# Patient Record
Sex: Female | Born: 1940 | Race: White | Hispanic: No | Smoking: Former smoker
Health system: Southern US, Community
[De-identification: ages and names within clinical notes are randomized; demographics above are authoritative.]

## PROBLEM LIST (undated history)

## (undated) DIAGNOSIS — K52832 Lymphocytic colitis: Secondary | ICD-10-CM

## (undated) DIAGNOSIS — K219 Gastro-esophageal reflux disease without esophagitis: Secondary | ICD-10-CM

## (undated) DIAGNOSIS — E7800 Pure hypercholesterolemia, unspecified: Secondary | ICD-10-CM

## (undated) DIAGNOSIS — I10 Essential (primary) hypertension: Secondary | ICD-10-CM

## (undated) DIAGNOSIS — G43909 Migraine, unspecified, not intractable, without status migrainosus: Secondary | ICD-10-CM

## (undated) DIAGNOSIS — N390 Urinary tract infection, site not specified: Secondary | ICD-10-CM

## (undated) HISTORY — PX: TUBAL LIGATION: SHX77

---

## 2010-05-03 ENCOUNTER — Emergency Department (HOSPITAL_BASED_OUTPATIENT_CLINIC_OR_DEPARTMENT_OTHER)
Admission: EM | Admit: 2010-05-03 | Discharge: 2010-05-04 | Disposition: A | Discharge status: Home / Self Care | Payer: Medicare Other | Attending: Emergency Medicine | Admitting: Emergency Medicine

## 2010-05-03 DIAGNOSIS — R5381 Other malaise: Secondary | ICD-10-CM | POA: Insufficient documentation

## 2010-05-03 DIAGNOSIS — E785 Hyperlipidemia, unspecified: Secondary | ICD-10-CM | POA: Insufficient documentation

## 2010-05-03 DIAGNOSIS — E876 Hypokalemia: Secondary | ICD-10-CM | POA: Insufficient documentation

## 2010-05-03 DIAGNOSIS — M810 Age-related osteoporosis without current pathological fracture: Secondary | ICD-10-CM | POA: Insufficient documentation

## 2010-05-03 DIAGNOSIS — I10 Essential (primary) hypertension: Secondary | ICD-10-CM | POA: Insufficient documentation

## 2010-05-03 LAB — COMPREHENSIVE METABOLIC PANEL
Alkaline Phosphatase: 64 U/L (ref 39–117)
BUN: 21 mg/dL (ref 6–23)
Glucose, Bld: 146 mg/dL — ABNORMAL HIGH (ref 70–99)
Potassium: 3.4 mEq/L — ABNORMAL LOW (ref 3.5–5.1)
Total Bilirubin: 1 mg/dL (ref 0.3–1.2)
Total Protein: 7.7 g/dL (ref 6.0–8.3)

## 2010-05-03 LAB — CBC
HCT: 38.6 % (ref 36.0–46.0)
MCH: 29.1 pg (ref 26.0–34.0)
MCV: 87.7 fL (ref 78.0–100.0)
RDW: 13.6 % (ref 11.5–15.5)
WBC: 4.1 10*3/uL (ref 4.0–10.5)

## 2010-05-03 LAB — DIFFERENTIAL
Eosinophils Relative: 0 % (ref 0–5)
Lymphocytes Relative: 8 % — ABNORMAL LOW (ref 12–46)
Lymphs Abs: 0.3 10*3/uL — ABNORMAL LOW (ref 0.7–4.0)
Monocytes Absolute: 0.4 10*3/uL (ref 0.1–1.0)
Monocytes Relative: 9 % (ref 3–12)

## 2010-05-04 ENCOUNTER — Emergency Department (INDEPENDENT_AMBULATORY_CARE_PROVIDER_SITE_OTHER): Payer: Medicare Other

## 2010-05-04 ENCOUNTER — Emergency Department (HOSPITAL_BASED_OUTPATIENT_CLINIC_OR_DEPARTMENT_OTHER)
Admission: EM | Admit: 2010-05-04 | Discharge: 2010-05-04 | Disposition: A | Discharge status: Home / Self Care | Payer: Medicare Other | Attending: Emergency Medicine | Admitting: Emergency Medicine

## 2010-05-04 DIAGNOSIS — Z79899 Other long term (current) drug therapy: Secondary | ICD-10-CM | POA: Insufficient documentation

## 2010-05-04 DIAGNOSIS — R5383 Other fatigue: Secondary | ICD-10-CM | POA: Insufficient documentation

## 2010-05-04 DIAGNOSIS — E785 Hyperlipidemia, unspecified: Secondary | ICD-10-CM | POA: Insufficient documentation

## 2010-05-04 DIAGNOSIS — R197 Diarrhea, unspecified: Secondary | ICD-10-CM

## 2010-05-04 DIAGNOSIS — R112 Nausea with vomiting, unspecified: Secondary | ICD-10-CM

## 2010-05-04 DIAGNOSIS — R630 Anorexia: Secondary | ICD-10-CM | POA: Insufficient documentation

## 2010-05-04 DIAGNOSIS — I10 Essential (primary) hypertension: Secondary | ICD-10-CM | POA: Insufficient documentation

## 2010-05-04 DIAGNOSIS — E876 Hypokalemia: Secondary | ICD-10-CM | POA: Insufficient documentation

## 2010-05-04 DIAGNOSIS — M810 Age-related osteoporosis without current pathological fracture: Secondary | ICD-10-CM | POA: Insufficient documentation

## 2010-05-04 DIAGNOSIS — R5381 Other malaise: Secondary | ICD-10-CM

## 2010-05-04 LAB — BASIC METABOLIC PANEL
Calcium: 7.5 mg/dL — ABNORMAL LOW (ref 8.4–10.5)
Creatinine, Ser: 0.6 mg/dL (ref 0.4–1.2)
GFR calc Af Amer: >60 mL/min (ref >60)

## 2010-05-04 LAB — URINE MICROSCOPIC-ADD ON

## 2010-05-04 LAB — URINALYSIS, ROUTINE W REFLEX MICROSCOPIC
Specific Gravity, Urine: 1.02 (ref 1.005–1.030)
Urine Glucose, Fasting: NEGATIVE mg/dL
pH: 6 (ref 5.0–8.0)

## 2010-05-04 LAB — CBC
Hemoglobin: 11.4 g/dL — ABNORMAL LOW (ref 12.0–15.0)
RBC: 3.9 MIL/uL (ref 3.87–5.11)

## 2010-05-04 LAB — POTASSIUM: Potassium: 3.7 mEq/L (ref 3.5–5.1)

## 2010-05-04 LAB — DIFFERENTIAL
Basophils Absolute: 0 10*3/uL (ref 0.0–0.1)
Basophils Relative: 1 % (ref 0–1)
Monocytes Relative: 14 % — ABNORMAL HIGH (ref 3–12)
Neutro Abs: 2.7 10*3/uL (ref 1.7–7.7)
Neutrophils Relative %: 71 % (ref 43–77)

## 2012-03-21 ENCOUNTER — Emergency Department (HOSPITAL_BASED_OUTPATIENT_CLINIC_OR_DEPARTMENT_OTHER): Payer: Medicare Other

## 2012-03-21 ENCOUNTER — Encounter (HOSPITAL_BASED_OUTPATIENT_CLINIC_OR_DEPARTMENT_OTHER): Payer: Self-pay | Admitting: *Deleted

## 2012-03-21 ENCOUNTER — Emergency Department (HOSPITAL_BASED_OUTPATIENT_CLINIC_OR_DEPARTMENT_OTHER)
Admission: EM | Admit: 2012-03-21 | Discharge: 2012-03-21 | Disposition: A | Discharge status: Home / Self Care | Payer: Medicare Other | Attending: Emergency Medicine | Admitting: Emergency Medicine

## 2012-03-21 DIAGNOSIS — E7800 Pure hypercholesterolemia, unspecified: Secondary | ICD-10-CM | POA: Insufficient documentation

## 2012-03-21 DIAGNOSIS — Z87440 Personal history of urinary (tract) infections: Secondary | ICD-10-CM | POA: Insufficient documentation

## 2012-03-21 DIAGNOSIS — Z87891 Personal history of nicotine dependence: Secondary | ICD-10-CM | POA: Insufficient documentation

## 2012-03-21 DIAGNOSIS — I10 Essential (primary) hypertension: Secondary | ICD-10-CM | POA: Insufficient documentation

## 2012-03-21 DIAGNOSIS — R110 Nausea: Secondary | ICD-10-CM | POA: Insufficient documentation

## 2012-03-21 DIAGNOSIS — Z79899 Other long term (current) drug therapy: Secondary | ICD-10-CM | POA: Insufficient documentation

## 2012-03-21 DIAGNOSIS — R05 Cough: Secondary | ICD-10-CM | POA: Insufficient documentation

## 2012-03-21 DIAGNOSIS — R059 Cough, unspecified: Secondary | ICD-10-CM | POA: Insufficient documentation

## 2012-03-21 DIAGNOSIS — R197 Diarrhea, unspecified: Secondary | ICD-10-CM | POA: Insufficient documentation

## 2012-03-21 DIAGNOSIS — Z8719 Personal history of other diseases of the digestive system: Secondary | ICD-10-CM | POA: Insufficient documentation

## 2012-03-21 HISTORY — DX: Lymphocytic colitis: K52.832

## 2012-03-21 HISTORY — DX: Urinary tract infection, site not specified: N39.0

## 2012-03-21 LAB — URINALYSIS, ROUTINE W REFLEX MICROSCOPIC
Glucose, UA: NEGATIVE mg/dL
Ketones, ur: 40 mg/dL — AB
Leukocytes, UA: NEGATIVE
Protein, ur: NEGATIVE mg/dL

## 2012-03-21 LAB — CBC WITH DIFFERENTIAL/PLATELET
Basophils Absolute: 0 10*3/uL (ref 0.0–0.1)
Basophils Relative: 1 % (ref 0–1)
Eosinophils Absolute: 0.1 10*3/uL (ref 0.0–0.7)
Eosinophils Relative: 1 % (ref 0–5)
HCT: 30 % — ABNORMAL LOW (ref 36.0–46.0)
MCH: 28.7 pg (ref 26.0–34.0)
MCHC: 31.3 g/dL (ref 30.0–36.0)
Monocytes Absolute: 1.1 10*3/uL — ABNORMAL HIGH (ref 0.1–1.0)
Neutro Abs: 5.1 10*3/uL (ref 1.7–7.7)
RDW: 16.9 % — ABNORMAL HIGH (ref 11.5–15.5)

## 2012-03-21 LAB — COMPREHENSIVE METABOLIC PANEL
AST: 24 U/L (ref 0–37)
Albumin: 3.2 g/dL — ABNORMAL LOW (ref 3.5–5.2)
Calcium: 9.8 mg/dL (ref 8.4–10.5)
Chloride: 94 mEq/L — ABNORMAL LOW (ref 96–112)
Creatinine, Ser: 0.7 mg/dL (ref 0.50–1.10)
Total Protein: 6.8 g/dL (ref 6.0–8.3)

## 2012-03-21 MED ORDER — SODIUM CHLORIDE 0.9 % IV BOLUS (SEPSIS)
500.0000 mL | Freq: Once | INTRAVENOUS | Status: AC
  Administered 2012-03-21: 500 mL via INTRAVENOUS

## 2012-03-21 MED ORDER — ONDANSETRON HCL 4 MG PO TABS: 4.0000 mg | ORAL_TABLET | Freq: Four times a day (QID) | ORAL | Status: DC

## 2012-03-21 MED ORDER — DIPHENOXYLATE-ATROPINE 2.5-0.025 MG PO TABS: 1.0000 | ORAL_TABLET | Freq: Four times a day (QID) | ORAL | Status: DC | PRN

## 2012-03-21 MED ORDER — ONDANSETRON HCL 4 MG/2ML IJ SOLN
4.0000 mg | Freq: Once | INTRAMUSCULAR | Status: AC
  Administered 2012-03-21: 4 mg via INTRAVENOUS
  Filled 2012-03-21: qty 2

## 2012-03-21 NOTE — ED Notes (Signed)
Family at bedside, reports she's not acting per norm.  Family states they went shopping yesterday and she came home and just went to bed.  Family states the last time she seemed "out of it", she had a UTI.  Pt. Denies any pain.  Pt. Reports she has had diarrhea over the last couple of weeks and just feels tired.

## 2012-03-21 NOTE — ED Notes (Signed)
Pt's daughter states pt has a 3 days history of not feeling good and extreme tiredness, decreased po and food intake and excessive sleepiness.  States she has had similar episode of same and had a UTI.

## 2012-03-21 NOTE — ED Provider Notes (Signed)
History     CSN: [id number]  Arrival date & time 03/21/12  1236   First MD Initiated Contact with Patient 03/21/12 1257      Chief Complaint  Patient presents with  . Altered Mental Status    (Consider location/radiation/quality/duration/timing/severity/associated sxs/prior treatment) Patient is a 71 y.o. female presenting with altered mental status. The history is provided by the patient, a relative and a friend. No language interpreter was used.  Altered Mental Status This is a new problem. The current episode started in the past 7 days. The problem occurs daily. The problem has been gradually worsening. Associated symptoms include a change in bowel habit, coughing and nausea. Pertinent negatives include no chest pain, congestion, diaphoresis, fever, rash, sore throat or vomiting. She has tried nothing for the symptoms.   71 year old female coming in today with complaint of decreased level of consciousness. Patient is here with HER-2 sisters that say she has been very sleepy since Christmas. States that they were shopping until  4 pm esterday and she was so sleepy she could not drive home. The patient is slow to answer during the assessment. States that she has had increased episodes of diarrhea in the past 3 days. States she is out of her Lomotil at home. She has a past medical history of hypertension, cardiac cholesterol, colitis. Denies any abdominal pain or blood in her stool. States she did have a cough when she woke up this morning. Denies any fever, vomiting, dysuria or frequency.     Past Medical History  Diagnosis Date  . Lymphocytic colitis   . UTI (lower urinary tract infection)     History reviewed. No pertinent past surgical history.  No family history on file.  History  Substance Use Topics  . Smoking status: Former Games developer  . Smokeless tobacco: Not on file  . Alcohol Use: No    OB History    Grav Para Term Preterm Abortions TAB SAB Ect Mult Living        Review of Systems  Constitutional: Negative.  Negative for fever and diaphoresis.  HENT: Negative.  Negative for congestion, sore throat and rhinorrhea.   Eyes: Negative.   Respiratory: Positive for cough. Negative for shortness of breath and wheezing.   Cardiovascular: Negative.  Negative for chest pain and leg swelling.  Gastrointestinal: Positive for nausea and change in bowel habit. Negative for vomiting.  Genitourinary: Negative for dysuria, vaginal bleeding, vaginal discharge and difficulty urinating.  Musculoskeletal: Negative for back pain and gait problem.  Skin: Negative for rash.  Neurological: Negative.   Psychiatric/Behavioral: Positive for altered mental status.  All other systems reviewed and are negative.    Allergies  Review of patient's allergies indicates no known allergies.  Home Medications   Current Outpatient Rx  Name  Route  Sig  Dispense  Refill  . ATENOLOL 25 MG PO TABS   Oral   Take 25 mg by mouth daily.         Marland Kitchen DIPHENOXYLATE-ATROPINE 2.5-0.025 MG PO TABS   Oral   Take 1 tablet by mouth 4 (four) times daily as needed.         Marland Kitchen HYDROCHLOROTHIAZIDE 25 MG PO TABS   Oral   Take 25 mg by mouth daily.         Marland Kitchen OMEPRAZOLE 10 MG PO CPDR   Oral   Take 10 mg by mouth as needed.           BP 106/52  Pulse 75  Temp 98.6 F (37 C) (Oral)  Resp 16  SpO2 97%  Physical Exam  Nursing note and vitals reviewed. Constitutional: She is oriented to person, place, and time. She appears well-developed and well-nourished.  HENT:  Head: Normocephalic and atraumatic.  Eyes: Conjunctivae normal and EOM are normal. Pupils are equal, round, and reactive to light.  Neck: Normal range of motion. Neck supple.  Cardiovascular: Normal rate and regular rhythm.   Pulmonary/Chest: Effort normal and breath sounds normal. No respiratory distress. She has no wheezes.  Abdominal: Soft. Bowel sounds are normal. She exhibits no distension. There is no  tenderness.  Musculoskeletal: Normal range of motion. She exhibits no edema and no tenderness.  Neurological: She is alert and oriented to person, place, and time. She has normal reflexes.  Skin: Skin is warm and dry.  Psychiatric: She has a normal mood and affect.    ED Course  Procedures (including critical care time)  Labs Reviewed  URINALYSIS, ROUTINE W REFLEX MICROSCOPIC - Abnormal; Notable for the following:    APPearance CLOUDY (*)     Bilirubin Urine MODERATE (*)     Ketones, ur 40 (*)     All other components within normal limits  CBC WITH DIFFERENTIAL - Abnormal; Notable for the following:    RBC 3.27 (*)     Hemoglobin 9.4 (*)     HCT 30.0 (*)     RDW 16.9 (*)     Monocytes Relative 15 (*)     Monocytes Absolute 1.1 (*)     All other components within normal limits  COMPREHENSIVE METABOLIC PANEL - Abnormal; Notable for the following:    Sodium 134 (*)     Chloride 94 (*)     Glucose, Bld 129 (*)     Albumin 3.2 (*)     GFR calc non Af Amer 86 (*)     All other components within normal limits   Dg Chest 2 View  03/21/2012  *RADIOLOGY REPORT*  Clinical Data: Altered level of consciousness.  CHEST - 2 VIEW  Comparison: 05/04/2010  Findings: Minimal right base density, likely atelectasis.  Heart is borderline in size.  No effusions.  Left lung is clear.   Moderate compression fracture in the mid thoracic spine, new since prior study.  Degenerative changes in the thoracolumbar spine.  IMPRESSION: Minimal right base atelectasis.   Original Report Authenticated By: Charlett Nose, M.D.    Ct Head Wo Contrast  03/21/2012  *RADIOLOGY REPORT*  Clinical Data: Decreased level of consciousness.  CT HEAD WITHOUT CONTRAST  Technique:  Contiguous axial images were obtained from the base of the skull through the vertex without contrast.  Comparison: None.  Findings: Extensive low density throughout the periventricular and deep white matter, likely chronic ischemic changes.  No  hydrocephalus.  No acute infarction or hemorrhage.  No mass lesion. No calvarial abnormality. Visualized paranasal sinuses and mastoids clear.  Orbital soft tissues unremarkable.  IMPRESSION: Extensive chronic ischemic changes throughout the deep white matter.  No acute findings.   Original Report Authenticated By: Charlett Nose, M.D.      No diagnosis found.    MDM  Decreased LOC and sleepiness x3 days. Chest x-ray reviewed by myself shows no pneumonia. Urinalysis shows no infection. White blood cell count was normal today. CT of her head reviewed by myself is unremarkable. Patient has had some increased diarrhea in the past 3 days. Patient seemed better after 500 cc of normal saline. She is out of her  Lomotil for your prescription for Lomotil and Zofran. She will followup with her primary care provider on Monday.  Labs Reviewed  URINALYSIS, ROUTINE W REFLEX MICROSCOPIC - Abnormal; Notable for the following:    APPearance CLOUDY (*)     Bilirubin Urine MODERATE (*)     Ketones, ur 40 (*)     All other components within normal limits  CBC WITH DIFFERENTIAL - Abnormal; Notable for the following:    RBC 3.27 (*)     Hemoglobin 9.4 (*)     HCT 30.0 (*)     RDW 16.9 (*)     Monocytes Relative 15 (*)     Monocytes Absolute 1.1 (*)     All other components within normal limits  COMPREHENSIVE METABOLIC PANEL - Abnormal; Notable for the following:    Sodium 134 (*)     Chloride 94 (*)     Glucose, Bld 129 (*)     Albumin 3.2 (*)     GFR calc non Af Amer 86 (*)     All other components within normal limits          Remi Haggard, NP 03/22/12 0945

## 2012-03-21 NOTE — ED Notes (Signed)
Pt. Vomiting, NP made aware.  Orders received.

## 2012-03-22 ENCOUNTER — Emergency Department (HOSPITAL_BASED_OUTPATIENT_CLINIC_OR_DEPARTMENT_OTHER)
Admission: EM | Admit: 2012-03-22 | Discharge: 2012-03-22 | Disposition: A | Discharge status: Home / Self Care | Payer: Medicare Other | Attending: Emergency Medicine | Admitting: Emergency Medicine

## 2012-03-22 ENCOUNTER — Encounter (HOSPITAL_BASED_OUTPATIENT_CLINIC_OR_DEPARTMENT_OTHER): Payer: Self-pay | Admitting: *Deleted

## 2012-03-22 DIAGNOSIS — Z79899 Other long term (current) drug therapy: Secondary | ICD-10-CM | POA: Insufficient documentation

## 2012-03-22 DIAGNOSIS — Z87891 Personal history of nicotine dependence: Secondary | ICD-10-CM | POA: Insufficient documentation

## 2012-03-22 DIAGNOSIS — R112 Nausea with vomiting, unspecified: Secondary | ICD-10-CM | POA: Insufficient documentation

## 2012-03-22 DIAGNOSIS — Z8719 Personal history of other diseases of the digestive system: Secondary | ICD-10-CM | POA: Insufficient documentation

## 2012-03-22 DIAGNOSIS — Z87440 Personal history of urinary (tract) infections: Secondary | ICD-10-CM | POA: Insufficient documentation

## 2012-03-22 LAB — CBC
HCT: 28.4 % — ABNORMAL LOW (ref 36.0–46.0)
Hemoglobin: 9 g/dL — ABNORMAL LOW (ref 12.0–15.0)
MCH: 28.8 pg (ref 26.0–34.0)
MCV: 91 fL (ref 78.0–100.0)
RBC: 3.12 MIL/uL — ABNORMAL LOW (ref 3.87–5.11)
WBC: 5.9 10*3/uL (ref 4.0–10.5)

## 2012-03-22 LAB — BASIC METABOLIC PANEL
CO2: 28 mEq/L (ref 19–32)
Calcium: 8.5 mg/dL (ref 8.4–10.5)
Chloride: 95 mEq/L — ABNORMAL LOW (ref 96–112)
Creatinine, Ser: 0.5 mg/dL (ref 0.50–1.10)
Glucose, Bld: 153 mg/dL — ABNORMAL HIGH (ref 70–99)

## 2012-03-22 MED ORDER — SODIUM CHLORIDE 0.9 % IV BOLUS (SEPSIS)
500.0000 mL | Freq: Once | INTRAVENOUS | Status: AC
  Administered 2012-03-22: 500 mL via INTRAVENOUS

## 2012-03-22 MED ORDER — METOCLOPRAMIDE HCL 5 MG/ML IJ SOLN
10.0000 mg | Freq: Once | INTRAMUSCULAR | Status: AC
  Administered 2012-03-22: 10 mg via INTRAVENOUS
  Filled 2012-03-22: qty 2

## 2012-03-22 MED ORDER — ONDANSETRON HCL 4 MG/2ML IJ SOLN
4.0000 mg | Freq: Once | INTRAMUSCULAR | Status: AC
  Administered 2012-03-22: 4 mg via INTRAVENOUS
  Filled 2012-03-22: qty 2

## 2012-03-22 MED ORDER — PROMETHAZINE HCL 25 MG RE SUPP: 12.5000 mg | Freq: Four times a day (QID) | RECTAL | Status: DC | PRN

## 2012-03-22 MED ORDER — FAMOTIDINE IN NACL 20-0.9 MG/50ML-% IV SOLN
20.0000 mg | Freq: Once | INTRAVENOUS | Status: AC
  Administered 2012-03-22: 20 mg via INTRAVENOUS
  Filled 2012-03-22: qty 50

## 2012-03-22 MED ORDER — METOCLOPRAMIDE HCL 10 MG PO TABS: 10.0000 mg | ORAL_TABLET | Freq: Four times a day (QID) | ORAL | Status: DC

## 2012-03-22 NOTE — ED Notes (Addendum)
Diarrhea since Christmas Eve, vomiting since yesterday. Hx colitis. "Want to sleep all the time" Here yesterday.

## 2012-03-23 NOTE — ED Provider Notes (Signed)
History     CSN: [id number]  Arrival date & time 03/22/12  1536   First MD Initiated Contact with Patient 03/22/12 1551      Chief Complaint  Patient presents with  . Emesis    (Consider location/radiation/quality/duration/timing/severity/associated sxs/prior treatment) Patient is a 71 y.o. female presenting with vomiting. The history is provided by the patient. No language interpreter was used.  Emesis  This is a recurrent problem. The current episode started 6 to 12 hours ago. The problem occurs 2 to 4 times per day. The problem has not changed since onset.The emesis has an appearance of stomach contents. Associated symptoms include a fever. Pertinent negatives include no abdominal pain and no diarrhea.   71 year old female coming in with recurrent nausea vomiting. Patient was here in the ER yesterday for nausea vomiting diarrhea and decreased LOC. States he went she went home she was able to eat something but later in the night she started vomiting again. She has had no further diarrhea. Patient is nauseated presently. She is here with her family. Non toxic appearance.   Past Medical History  Diagnosis Date  . Lymphocytic colitis   . UTI (lower urinary tract infection)     History reviewed. No pertinent past surgical history.  History reviewed. No pertinent family history.  History  Substance Use Topics  . Smoking status: Former Games developer  . Smokeless tobacco: Not on file  . Alcohol Use: No    OB History    Grav Para Term Preterm Abortions TAB SAB Ect Mult Living                  Review of Systems  Constitutional: Positive for fever.  HENT: Negative.  Negative for congestion, rhinorrhea and postnasal drip.   Eyes: Negative.   Respiratory: Negative.  Negative for shortness of breath.   Cardiovascular: Negative.  Negative for chest pain.  Gastrointestinal: Positive for nausea and vomiting. Negative for abdominal pain and diarrhea.  Neurological: Negative.     Psychiatric/Behavioral: Negative.   All other systems reviewed and are negative.    Allergies  Tetanus toxoids  Home Medications   Current Outpatient Rx  Name  Route  Sig  Dispense  Refill  . ATENOLOL 25 MG PO TABS   Oral   Take 25 mg by mouth daily.         Marland Kitchen DIPHENOXYLATE-ATROPINE 2.5-0.025 MG PO TABS   Oral   Take 1 tablet by mouth 4 (four) times daily as needed.         Marland Kitchen DIPHENOXYLATE-ATROPINE 2.5-0.025 MG PO TABS   Oral   Take 1 tablet by mouth 4 (four) times daily as needed for diarrhea or loose stools.   30 tablet   0   . HYDROCHLOROTHIAZIDE 25 MG PO TABS   Oral   Take 25 mg by mouth daily.         Marland Kitchen METOCLOPRAMIDE HCL 10 MG PO TABS   Oral   Take 1 tablet (10 mg total) by mouth every 6 (six) hours.   30 tablet   0   . OMEPRAZOLE 10 MG PO CPDR   Oral   Take 10 mg by mouth as needed.         Marland Kitchen ONDANSETRON HCL 4 MG PO TABS   Oral   Take 1 tablet (4 mg total) by mouth every 6 (six) hours.   12 tablet   0   . PROMETHAZINE HCL 25 MG RE SUPP   Rectal  Place 0.5 suppositories (12.5 mg total) rectally every 6 (six) hours as needed for nausea.   12 each   0     BP 128/65  Pulse 74  Temp 98 F (36.7 C) (Oral)  Resp 18  SpO2 95%  Physical Exam  Nursing note and vitals reviewed. Constitutional: She is oriented to person, place, and time. She appears well-developed and well-nourished.  HENT:  Head: Normocephalic and atraumatic.  Eyes: Conjunctivae normal and EOM are normal. Pupils are equal, round, and reactive to light.  Neck: Normal range of motion. Neck supple.  Cardiovascular: Normal rate.   Pulmonary/Chest: Effort normal and breath sounds normal.  Abdominal: Soft. Bowel sounds are normal. She exhibits no distension. There is no tenderness. There is no rebound and no guarding.  Musculoskeletal: Normal range of motion. She exhibits no edema and no tenderness.  Neurological: She is alert and oriented to person, place, and time. She has  normal reflexes.  Skin: Skin is warm and dry.  Psychiatric: She has a normal mood and affect.    ED Course  Procedures (including critical care time)  Labs Reviewed  CBC - Abnormal; Notable for the following:    RBC 3.12 (*)     Hemoglobin 9.0 (*)     HCT 28.4 (*)     RDW 16.2 (*)     All other components within normal limits  BASIC METABOLIC PANEL - Abnormal; Notable for the following:    Sodium 132 (*)     Chloride 95 (*)     Glucose, Bld 153 (*)     All other components within normal limits   No results found.   1. Nausea & vomiting       MDM  71 yo female with viral illness and recurring nausea and vomiting.  Diarrhea has resolved.  CBC and Bmet with na 132 and hgb 9.0.  Ns Bolus, IV Zofran, reglan and pepcid in the ER with resolution of vomiting.  rx for reglan and phenergan supp.  She will follow up with her pcp on Monday.  Tolerating po's in the ER.  She wants  To go home and feels better.  She understands to return for worsening symptoms. Ready for discharge.    Labs Reviewed  CBC - Abnormal; Notable for the following:    RBC 3.12 (*)     Hemoglobin 9.0 (*)     HCT 28.4 (*)     RDW 16.2 (*)     All other components within normal limits  BASIC METABOLIC PANEL - Abnormal; Notable for the following:    Sodium 132 (*)     Chloride 95 (*)     Glucose, Bld 153 (*)     All other components within normal limits         Remi Haggard, NP 03/24/12 1125

## 2012-03-25 NOTE — ED Provider Notes (Signed)
Medical screening examination/treatment/procedure(s) were performed by non-physician practitioner and as supervising physician I was immediately available for consultation/collaboration.  Raeford Razor, MD 03/25/12 1336

## 2012-03-27 NOTE — ED Provider Notes (Signed)
Medical screening examination/treatment/procedure(s) were conducted as a shared visit with non-physician practitioner(s) and myself.  I personally evaluated the patient during the encounter  Ethelda Chick, MD 03/27/12 [phone]

## 2015-10-30 ENCOUNTER — Emergency Department (HOSPITAL_BASED_OUTPATIENT_CLINIC_OR_DEPARTMENT_OTHER): Payer: Medicare Other

## 2015-10-30 ENCOUNTER — Encounter (HOSPITAL_BASED_OUTPATIENT_CLINIC_OR_DEPARTMENT_OTHER): Payer: Self-pay | Admitting: *Deleted

## 2015-10-30 ENCOUNTER — Inpatient Hospital Stay (HOSPITAL_BASED_OUTPATIENT_CLINIC_OR_DEPARTMENT_OTHER)
Admission: EM | Admit: 2015-10-30 | Discharge: 2015-11-10 | DRG: 445 | Disposition: A | Discharge status: Home Health | Payer: Medicare Other | Attending: Internal Medicine | Admitting: Internal Medicine

## 2015-10-30 DIAGNOSIS — I10 Essential (primary) hypertension: Secondary | ICD-10-CM

## 2015-10-30 DIAGNOSIS — K8050 Calculus of bile duct without cholangitis or cholecystitis without obstruction: Secondary | ICD-10-CM | POA: Diagnosis not present

## 2015-10-30 DIAGNOSIS — K828 Other specified diseases of gallbladder: Secondary | ICD-10-CM | POA: Diagnosis present

## 2015-10-30 DIAGNOSIS — K8051 Calculus of bile duct without cholangitis or cholecystitis with obstruction: Secondary | ICD-10-CM | POA: Diagnosis present

## 2015-10-30 DIAGNOSIS — K2940 Chronic atrophic gastritis without bleeding: Secondary | ICD-10-CM | POA: Diagnosis present

## 2015-10-30 DIAGNOSIS — E860 Dehydration: Secondary | ICD-10-CM

## 2015-10-30 DIAGNOSIS — R740 Nonspecific elevation of levels of transaminase and lactic acid dehydrogenase [LDH]: Secondary | ICD-10-CM

## 2015-10-30 DIAGNOSIS — K8031 Calculus of bile duct with cholangitis, unspecified, with obstruction: Secondary | ICD-10-CM | POA: Diagnosis not present

## 2015-10-30 DIAGNOSIS — R062 Wheezing: Secondary | ICD-10-CM | POA: Clinically undetermined

## 2015-10-30 DIAGNOSIS — E876 Hypokalemia: Secondary | ICD-10-CM | POA: Clinically undetermined

## 2015-10-30 DIAGNOSIS — K838 Other specified diseases of biliary tract: Secondary | ICD-10-CM | POA: Diagnosis present

## 2015-10-30 DIAGNOSIS — Z887 Allergy status to serum and vaccine status: Secondary | ICD-10-CM

## 2015-10-30 DIAGNOSIS — K449 Diaphragmatic hernia without obstruction or gangrene: Secondary | ICD-10-CM | POA: Diagnosis present

## 2015-10-30 DIAGNOSIS — R0602 Shortness of breath: Secondary | ICD-10-CM

## 2015-10-30 DIAGNOSIS — Z87891 Personal history of nicotine dependence: Secondary | ICD-10-CM

## 2015-10-30 DIAGNOSIS — K5710 Diverticulosis of small intestine without perforation or abscess without bleeding: Secondary | ICD-10-CM | POA: Diagnosis present

## 2015-10-30 DIAGNOSIS — Z79899 Other long term (current) drug therapy: Secondary | ICD-10-CM

## 2015-10-30 DIAGNOSIS — R7401 Elevation of levels of liver transaminase levels: Secondary | ICD-10-CM | POA: Diagnosis present

## 2015-10-30 DIAGNOSIS — Z1611 Resistance to penicillins: Secondary | ICD-10-CM | POA: Diagnosis present

## 2015-10-30 DIAGNOSIS — K831 Obstruction of bile duct: Secondary | ICD-10-CM | POA: Diagnosis present

## 2015-10-30 DIAGNOSIS — B961 Klebsiella pneumoniae [K. pneumoniae] as the cause of diseases classified elsewhere: Secondary | ICD-10-CM | POA: Diagnosis present

## 2015-10-30 DIAGNOSIS — K219 Gastro-esophageal reflux disease without esophagitis: Secondary | ICD-10-CM | POA: Diagnosis present

## 2015-10-30 HISTORY — DX: Migraine, unspecified, not intractable, without status migrainosus: G43.909

## 2015-10-30 HISTORY — DX: Essential (primary) hypertension: I10

## 2015-10-30 HISTORY — DX: Gastro-esophageal reflux disease without esophagitis: K21.9

## 2015-10-30 HISTORY — DX: Pure hypercholesterolemia, unspecified: E78.00

## 2015-10-30 LAB — CBC
HEMATOCRIT: 35.3 % — AB (ref 36.0–46.0)
Hemoglobin: 11.7 g/dL — ABNORMAL LOW (ref 12.0–15.0)
MCH: 29.5 pg (ref 26.0–34.0)
MCHC: 33.1 g/dL (ref 30.0–36.0)
MCV: 89.1 fL (ref 78.0–100.0)
PLATELETS: 211 10*3/uL (ref 150–400)
RBC: 3.96 MIL/uL (ref 3.87–5.11)
RDW: 13.6 % (ref 11.5–15.5)
WBC: 8.8 10*3/uL (ref 4.0–10.5)

## 2015-10-30 LAB — CBC WITH DIFFERENTIAL/PLATELET
BASOS ABS: 0 10*3/uL (ref 0.0–0.1)
BASOS PCT: 0 %
Eosinophils Absolute: 0 10*3/uL (ref 0.0–0.7)
Eosinophils Relative: 0 %
HEMATOCRIT: 37.3 % (ref 36.0–46.0)
HEMOGLOBIN: 12.7 g/dL (ref 12.0–15.0)
LYMPHS PCT: 6 %
Lymphs Abs: 0.5 10*3/uL — ABNORMAL LOW (ref 0.7–4.0)
MCH: 29.4 pg (ref 26.0–34.0)
MCHC: 34 g/dL (ref 30.0–36.0)
MCV: 86.3 fL (ref 78.0–100.0)
Monocytes Absolute: 0.9 10*3/uL (ref 0.1–1.0)
Monocytes Relative: 10 %
NEUTROS ABS: 8 10*3/uL — AB (ref 1.7–7.7)
NEUTROS PCT: 84 %
Platelets: 230 10*3/uL (ref 150–400)
RBC: 4.32 MIL/uL (ref 3.87–5.11)
RDW: 13.1 % (ref 11.5–15.5)
WBC: 9.5 10*3/uL (ref 4.0–10.5)

## 2015-10-30 LAB — CREATININE, SERUM
Creatinine, Ser: 0.67 mg/dL (ref 0.44–1.00)
GFR calc non Af Amer: >60 mL/min (ref >60)

## 2015-10-30 LAB — MAGNESIUM: Magnesium: 2.2 mg/dL (ref 1.7–2.4)

## 2015-10-30 LAB — URINE MICROSCOPIC-ADD ON

## 2015-10-30 LAB — COMPREHENSIVE METABOLIC PANEL
ALK PHOS: 274 U/L — AB (ref 38–126)
ALT: 231 U/L — AB (ref 14–54)
AST: 185 U/L — ABNORMAL HIGH (ref 15–41)
Albumin: 3.4 g/dL — ABNORMAL LOW (ref 3.5–5.0)
Anion gap: 10 (ref 5–15)
BUN: 13 mg/dL (ref 6–20)
CALCIUM: 8.8 mg/dL — AB (ref 8.9–10.3)
CO2: 30 mmol/L (ref 22–32)
Chloride: 92 mmol/L — ABNORMAL LOW (ref 101–111)
Creatinine, Ser: 0.61 mg/dL (ref 0.44–1.00)
Glucose, Bld: 137 mg/dL — ABNORMAL HIGH (ref 65–99)
Potassium: 3.4 mmol/L — ABNORMAL LOW (ref 3.5–5.1)
Sodium: 132 mmol/L — ABNORMAL LOW (ref 135–145)
TOTAL PROTEIN: 7.8 g/dL (ref 6.5–8.1)
Total Bilirubin: 6.3 mg/dL — ABNORMAL HIGH (ref 0.3–1.2)

## 2015-10-30 LAB — PHOSPHORUS: Phosphorus: 2 mg/dL — ABNORMAL LOW (ref 2.5–4.6)

## 2015-10-30 LAB — URINALYSIS, ROUTINE W REFLEX MICROSCOPIC
Glucose, UA: NEGATIVE mg/dL
KETONES UR: NEGATIVE mg/dL
NITRITE: NEGATIVE
PH: 7 (ref 5.0–8.0)
Protein, ur: 30 mg/dL — AB

## 2015-10-30 LAB — LIPASE, BLOOD: Lipase: 23 U/L (ref 11–51)

## 2015-10-30 MED ORDER — METOCLOPRAMIDE HCL 10 MG PO TABS
10.0000 mg | ORAL_TABLET | Freq: Four times a day (QID) | ORAL | Status: DC
  Administered 2015-10-30 – 2015-11-05 (×19): 10 mg via ORAL
  Filled 2015-10-30 (×20): qty 1

## 2015-10-30 MED ORDER — HYDROCODONE-ACETAMINOPHEN 5-325 MG PO TABS
1.0000 | ORAL_TABLET | ORAL | Status: DC | PRN
  Administered 2015-10-30 – 2015-11-09 (×2): 1 via ORAL
  Filled 2015-10-30 (×3): qty 1

## 2015-10-30 MED ORDER — ONDANSETRON HCL 4 MG PO TABS: 4.0000 mg | ORAL_TABLET | Freq: Four times a day (QID) | ORAL | Status: DC | PRN

## 2015-10-30 MED ORDER — SODIUM CHLORIDE 0.9 % IV BOLUS (SEPSIS)
500.0000 mL | Freq: Once | INTRAVENOUS | Status: AC
  Administered 2015-10-30: 500 mL via INTRAVENOUS

## 2015-10-30 MED ORDER — ONDANSETRON HCL 4 MG/2ML IJ SOLN: 4.0000 mg | Freq: Four times a day (QID) | INTRAMUSCULAR | Status: DC | PRN

## 2015-10-30 MED ORDER — IOPAMIDOL (ISOVUE-300) INJECTION 61%
100.0000 mL | Freq: Once | INTRAVENOUS | Status: AC | PRN
  Administered 2015-10-30: 100 mL via INTRAVENOUS

## 2015-10-30 MED ORDER — HEPARIN SODIUM (PORCINE) 5000 UNIT/ML IJ SOLN
5000.0000 [IU] | Freq: Three times a day (TID) | INTRAMUSCULAR | Status: DC
  Administered 2015-10-30 – 2015-11-01 (×7): 5000 [IU] via SUBCUTANEOUS
  Filled 2015-10-30 (×7): qty 1

## 2015-10-30 MED ORDER — KCL IN DEXTROSE-NACL 20-5-0.9 MEQ/L-%-% IV SOLN
INTRAVENOUS | Status: DC
  Administered 2015-10-30 – 2015-11-05 (×9): via INTRAVENOUS
  Filled 2015-10-30 (×11): qty 1000

## 2015-10-30 NOTE — Progress Notes (Signed)
[facility] for further evaluation of dilated common bile duct with possible mass versus stone obstructing. Dr. Bosie ClosSchooler from [facility] consulted, patient will be coming to medical bed

## 2015-10-30 NOTE — ED Notes (Signed)
Pt back from CT

## 2015-10-30 NOTE — ED Notes (Signed)
Notified [facility]([place] for GI Consult  Notified [facility]([place] for hospitalist

## 2015-10-30 NOTE — ED Provider Notes (Addendum)
[facility] Provider Note   CSN: [id number] Arrival date & time: 10/30/15  4098  First Provider Contact:  First MD Initiated Contact with Patient 10/30/15 0932        History   Chief Complaint Chief Complaint  Patient presents with  . Weakness    HPI Amy Sheppard is a 75 y.o. female.  Patient presents with weakness. She has a history of hypertension and lymphocytic colitis. She states that 4 days ago, she had an episode of vomiting. She vomited through the night. She feels like she just ate too much and that led to the vomiting. She hasn't had any further vomiting since that time. No abdominal pain. No diarrhea. No known fevers. No urinary symptoms. No chest pain or shortness of breath. No dizziness or lightheadedness. No cough or chest congestion. She states she's just been feeling weak all over.      Past Medical History:  Diagnosis Date  . Hypertension   . Lymphocytic colitis   . UTI (lower urinary tract infection)     There are no active problems to display for this patient.   Past Surgical History:  Procedure Laterality Date  . TUBAL LIGATION      OB History    No data available       Home Medications    Prior to Admission medications   Medication Sig Start Date End Date Taking? Authorizing Provider  atenolol (TENORMIN) 25 MG tablet Take 25 mg by mouth daily.   Yes Historical Provider, MD  hydrochlorothiazide (HYDRODIURIL) 25 MG tablet Take 25 mg by mouth daily.   Yes Historical Provider, MD  omeprazole (PRILOSEC) 10 MG capsule Take 10 mg by mouth as needed.   Yes Historical Provider, MD  potassium chloride (KLOR-CON) 8 MEQ tablet Take 8 mEq by mouth daily.   Yes Historical Provider, MD  SIMVASTATIN PO Take by mouth.   Yes Historical Provider, MD  diphenoxylate-atropine (LOMOTIL) 2.5-0.025 MG per tablet Take 1 tablet by mouth 4 (four) times daily as needed.    Historical Provider, MD  diphenoxylate-atropine (LOMOTIL) 2.5-0.025 MG per tablet  Take 1 tablet by mouth 4 (four) times daily as needed for diarrhea or loose stools. 03/21/12   Jethro Bastos, NP  metoCLOPramide (REGLAN) 10 MG tablet Take 1 tablet (10 mg total) by mouth every 6 (six) hours. 03/22/12   Jethro Bastos, NP  ondansetron (ZOFRAN) 4 MG tablet Take 1 tablet (4 mg total) by mouth every 6 (six) hours. 03/21/12   Jethro Bastos, NP  promethazine (PHENERGAN) 25 MG suppository Place 0.5 suppositories (12.5 mg total) rectally every 6 (six) hours as needed for nausea. 03/22/12   Jethro Bastos, NP    Family History No family history on file.  Social History Social History  Substance Use Topics  . Smoking status: Former Games developer  . Smokeless tobacco: Never Used  . Alcohol use No     Allergies   Tetanus toxoids   Review of Systems Review of Systems  Constitutional: Positive for fatigue. Negative for chills, diaphoresis and fever.  HENT: Negative for congestion, rhinorrhea and sneezing.   Eyes: Negative.   Respiratory: Negative for cough, chest tightness and shortness of breath.   Cardiovascular: Negative for chest pain and leg swelling.  Gastrointestinal: Positive for nausea and vomiting. Negative for abdominal pain, blood in stool and diarrhea.  Genitourinary: Negative for difficulty urinating, flank pain, frequency and hematuria.  Musculoskeletal: Negative for arthralgias and back pain.  Skin: Negative for  rash.  Neurological: Negative for dizziness, speech difficulty, weakness, numbness and headaches.     Physical Exam Updated Vital Signs BP 139/69 (BP Location: Right Arm)   Pulse 72   Temp 98.1 F (36.7 C) (Oral)   Resp 18   Ht 5\' 2"  (1.575 m)   Wt 170 lb (77.1 kg)   SpO2 98%   BMI 31.09 kg/m   Physical Exam  Constitutional: She is oriented to person, place, and time. She appears well-developed and well-nourished.  HENT:  Head: Normocephalic and atraumatic.  Eyes: Pupils are equal, round, and reactive to light.  Neck: Normal range of  motion. Neck supple.  Cardiovascular: Normal rate, regular rhythm and normal heart sounds.   Pulmonary/Chest: Effort normal and breath sounds normal. No respiratory distress. She has no wheezes. She has no rales. She exhibits no tenderness.  Abdominal: Soft. Bowel sounds are normal. There is no tenderness. There is no rebound and no guarding.  Musculoskeletal: Normal range of motion. She exhibits no edema.  Lymphadenopathy:    She has no cervical adenopathy.  Neurological: She is alert and oriented to person, place, and time.  Skin: Skin is warm and dry. No rash noted.  Psychiatric: She has a normal mood and affect.     ED Treatments / Results  Labs (all labs ordered are listed, but only abnormal results are displayed) Labs Reviewed  COMPREHENSIVE METABOLIC PANEL - Abnormal; Notable for the following:       Result Value   Sodium 132 (*)    Potassium 3.4 (*)    Chloride 92 (*)    Glucose, Bld 137 (*)    Calcium 8.8 (*)    Albumin 3.4 (*)    AST 185 (*)    ALT 231 (*)    Alkaline Phosphatase 274 (*)    Total Bilirubin 6.3 (*)    All other components within normal limits  CBC WITH DIFFERENTIAL/PLATELET - Abnormal; Notable for the following:    Neutro Abs 8.0 (*)    Lymphs Abs 0.5 (*)    All other components within normal limits  LIPASE, BLOOD  URINALYSIS, ROUTINE W REFLEX MICROSCOPIC (NOT AT [facility])    EKG  EKG Interpretation  Date/Time:  Sunday October 30 2015 09:52:47 EDT Ventricular Rate:  65 PR Interval:    QRS Duration: 87 QT Interval:  428 QTC Calculation: 445 R Axis:   61 Text Interpretation:  Sinus rhythm Atrial premature complex since last tracing no significant change Confirmed by Dravyn Severs  MD, Asaad Gulley ([id number]) on 10/30/2015 9:56:48 AM Also confirmed by Aryn Kops  MD, Aunesti Pellegrino ([id number]), editor [place], [place] [phone])  on 10/30/2015 11:52:08 AM       Radiology Ct Abdomen Pelvis W Contrast  Result Date: 10/30/2015 CLINICAL DATA:  Persistent vomiting for 8-10 hours 4 days  ago. Now weekend dehydrated. Elevated liver function test. EXAM: CT ABDOMEN AND PELVIS WITH CONTRAST TECHNIQUE: Multidetector CT imaging of the abdomen and pelvis was performed using the standard protocol following bolus administration of intravenous contrast. CONTRAST:  ISOVUE-300 IOPAMIDOL (ISOVUE-300) INJECTION 61% COMPARISON:  None. FINDINGS: Lower chest:  No acute findings. Hepatobiliary: Obstructing mass versus stone in the common bile duct, measuring approximately 1.2 cm, causing marked common bile duct and intrahepatic bile duct dilatation. Gallbladder is moderately distended. Layering stones versus sludge in the gallbladder. No pericholecystic inflammation seen. Pancreas: No pancreatic duct dilatation. No peripancreatic fluid or inflammation. Spleen: Within normal limits in size and appearance. Adrenals/Urinary Tract: Hyperdense mass exophytic to the posterior cortex  of the left kidney measures 1.7 x 1.5 cm. No left-sided renal stone or hydronephrosis. Right kidney appears normal without mass, stone or hydronephrosis. No ureteral or bladder calculi identified. Bladder appears normal. Stomach/Bowel: Bowel is normal in caliber. Scattered diverticulosis throughout the colon without evidence of acute diverticulitis. No evidence of bowel wall inflammation. Stomach appears normal. Appendix is normal. Vascular/Lymphatic: Atherosclerotic changes of the normal caliber abdominal aorta. Mildly prominent lymph nodes within the upper abdomen. Reproductive: Cystic mass within the left adnexa, presumably ovarian, measuring 3.5 x 3.5 cm. Right adnexal region is unremarkable. Other: No free fluid or abscess collections seen. No free intraperitoneal air. Musculoskeletal: Degenerative changes of the thoracolumbar spine, moderate in degree. No acute or suspicious osseous finding. Superficial soft tissues are unremarkable. IMPRESSION: 1. Obstructing hyperdense mass versus stone in the common bile duct, measuring  approximately 1.2 cm greatest dimension, causing marked CBD dilatation and intrahepatic bile duct dilatation. No pancreatic duct dilatation, therefore, obstructing stone versus mass is above the level of the pancreatic duct. Recommend GI consultation for possible ERCP. 2. Associated gallbladder distension, containing a small amount of layering stones and/or sludge. No pericholecystic fluid. 3. Mildly prominent lymph nodes within the upper abdomen, largest in the porta hepatis and portacaval regions. These could be reactive or neoplastic in nature, depending on the cause for the CBD obstruction. 4. Cystic mass in the left adnexa, measuring 3.5 x 3.5 cm. Given patient's age, recommend pelvic ultrasound after current issues are resolved to exclude cystic neoplasm of the left ovary. 5. Hyperdense mass exophytic to the posterior cortex of the left kidney, measuring 1.7 x 1.5 cm. Solid neoplastic mass cannot be excluded based on this single exam. Recommend renal MRI for further characterization after current issues are resolved. 6. Colonic diverticulosis without evidence of acute diverticulitis. 7. Aortic atherosclerosis. These results and recommendations were called by telephone at the time of interpretation on 10/30/2015 at 12:33 pm to Dr. Shawna Orleans Charisse Wendell , who verbally acknowledged these results. Electronically Signed   By: Bary Richard M.D.   On: 10/30/2015 12:35    Procedures Procedures (including critical care time)  Medications Ordered in ED Medications  sodium chloride 0.9 % bolus 500 mL (500 mLs Intravenous New Bag/Given 10/30/15 1012)  iopamidol (ISOVUE-300) 61 % injection 100 mL (100 mLs Intravenous Contrast Given 10/30/15 1153)     Initial Impression / Assessment and Plan / ED Course  I have reviewed the triage vital signs and the nursing notes.  Pertinent labs & imaging results that were available during my care of the patient were reviewed by me and considered in my medical decision making (see  chart for details).  Clinical Course    Patient presented with nausea vomiting and weakness. Her LFTs were elevated and a CT scan of the abdomen and pelvis was obtained. There appears to be a mass versus stone in the common diet bile duct. My plan was to admit the patient to the hospital and have an urgent GI consult/ERCP. Patient is adamantly refusing admission. She wants to follow-up with her PCP tomorrow. Patient would rather stay with the [facility] health system. I offered to transfer her to a [facility] hospital, but she is refusing. She states that she will call her doctor first thing in the morning. I discussed with the patient and with her sister that she has very serious symptoms that could rapidly progress to infection of her gallbladder, sepsis, or death. Patient still is refusing admission and will follow-up with her doctor tomorrow.  Her sister has talked to her as well and is unable to convince the pt to be admitted.   I advised her to use a clear liquid diet. I advised her if she changes her mind to return to our emergency department or in emergency department of her choice.  14:07 patient has changed her mind and is willing to be admitted. [facility]. I spoke with the hospitalist on-call, Dr. Etta GrandchildKikerkov, who has accepted the patient for transfer. We are awaiting a bed assignment.  15:37 [facility] advised that they are at capacity and not accepting any transfers until tomorrow. Patient is agreeable [facility]. I spoke with the gastroenterologist on-call, Dr. Bosie ClosSchooler who has agreed to see the patient when [facility]. I spoke with the hospitalist on-call, Dr. Mariea ClontsEmokpae, who has accepted the patient for transfer to a MedSurg bed.  Final Clinical Impressions(s) / ED Diagnoses   Final diagnoses:  Dehydration  Common bile duct (CBD) obstruction    New Prescriptions New Prescriptions   No medications on file     Rolan BuccoMelanie Ludwika Rodd,  MD 10/30/15 1259    Rolan BuccoMelanie Tess Potts, MD 10/30/15 1408    Rolan BuccoMelanie Alvie Fowles, MD 10/30/15 1538

## 2015-10-30 NOTE — ED Notes (Signed)
[facility] could not accept any patients.  They are at full capacity.

## 2015-10-30 NOTE — ED Notes (Signed)
Attempted to call report to RN at [facility]. RN was unavailable. Left callback number.

## 2015-10-30 NOTE — ED Notes (Signed)
Pt unable to give a urine sample at this time.

## 2015-10-30 NOTE — H&P (Signed)
History and Physical    Breuna Loveall ZOX:[id number] DOB: 1940-10-07 DOA: 10/30/2015  PCP: Gildardo Griffes, MD  Patient coming from: med [facility]  Chief Complaint: Nausea, poor oral intake  HPI: Amy Sheppard is a 75 y.o. female with medical history significant of hypertension who presents with 5 day complaint of poor oral intake. Started on Wednesday night when patient had nausea and emesis. Has felt weak since. Has had poor oral intake as well. At first she thought it was something she ate Wednesday but the problem has persisted. Given her progressive weakness she decided to come to the hospital for further evaluation recommendations. Nothing she is aware of makes it better eating makes it worse. The problem occurred insidiously.  ED Course: Patient had CT scan which reported obstructive thing hyperdense mass versus stone in the common bile duct with associated bladder distention. Please see full CT scan result  Review of Systems: As per HPI otherwise 10 point review of systems negative.   Past Medical History:  Diagnosis Date  . Hypertension   . Lymphocytic colitis   . UTI (lower urinary tract infection)     Past Surgical History:  Procedure Laterality Date  . TUBAL LIGATION       reports that she has quit smoking. She has never used smokeless tobacco. She reports that she does not drink alcohol or use drugs.  Allergies  Allergen Reactions  . Tetanus Toxoids    Family history:  Mother and sister had Gall bladder problems   Prior to Admission medications   Medication Sig Start Date End Date Taking? Authorizing Provider  atenolol (TENORMIN) 25 MG tablet Take 25 mg by mouth daily.   Yes Historical Provider, MD  hydrochlorothiazide (HYDRODIURIL) 25 MG tablet Take 25 mg by mouth daily.   Yes Historical Provider, MD  omeprazole (PRILOSEC) 10 MG capsule Take 10 mg by mouth as needed.   Yes Historical Provider, MD  potassium chloride (KLOR-CON) 8 MEQ tablet Take 8 mEq by mouth  daily.   Yes Historical Provider, MD  SIMVASTATIN PO Take by mouth.   Yes Historical Provider, MD  diphenoxylate-atropine (LOMOTIL) 2.5-0.025 MG per tablet Take 1 tablet by mouth 4 (four) times daily as needed.    Historical Provider, MD  diphenoxylate-atropine (LOMOTIL) 2.5-0.025 MG per tablet Take 1 tablet by mouth 4 (four) times daily as needed for diarrhea or loose stools. 03/21/12   Jethro Bastos, NP  metoCLOPramide (REGLAN) 10 MG tablet Take 1 tablet (10 mg total) by mouth every 6 (six) hours. 03/22/12   Jethro Bastos, NP  ondansetron (ZOFRAN) 4 MG tablet Take 1 tablet (4 mg total) by mouth every 6 (six) hours. 03/21/12   Jethro Bastos, NP  promethazine (PHENERGAN) 25 MG suppository Place 0.5 suppositories (12.5 mg total) rectally every 6 (six) hours as needed for nausea. 03/22/12   Jethro Bastos, NP    Physical Exam: Vitals:   10/30/15 1500 10/30/15 1530 10/30/15 1600 10/30/15 1630  BP: 131/72 121/55 135/67 117/60  Pulse: 68 68 71 69  Resp: Temp:      TempSrc:      SpO2: 98% 95% 97% 95%  Weight:      Height:          Constitutional: NAD, calm, comfortable Vitals:   10/30/15 1500 10/30/15 1530 10/30/15 1600 10/30/15 1630  BP: 131/72 121/55 135/67 117/60  Pulse: 68 68 71 69  Resp: Temp:  TempSrc:      SpO2: 98% 95% 97% 95%  Weight:      Height:       Eyes: PERRL, lids and conjunctivae normal ENMT: Mucous membranes are moist. Posterior pharynx clear of any exudate or lesions.Normal dentition.  Neck: normal, supple, no masses, no thyromegaly Respiratory: clear to auscultation bilaterally, no wheezing, no crackles. Normal respiratory effort. No accessory muscle use.  Cardiovascular: Regular rate and rhythm, no murmurs / rubs / gallops. No extremity edema. 2+ pedal pulses. No carotid bruits.  Abdomen: no tenderness, no masses palpated. No hepatosplenomegaly. Bowel sounds positive. Negative Murphy sign Musculoskeletal: no clubbing /  cyanosis. No joint deformity upper and lower extremities. Good ROM, no contractures. Normal muscle tone.  Skin: no rashes, lesions, ulcers. No induration Neurologic: CN 3-12 grossly intact. Sensation intact, DTR normal. Strength 5/5 in all 4.  Psychiatric: Normal judgment and insight. Alert and oriented x 3. Normal mood.    Labs on Admission: I have personally reviewed following labs and imaging studies  CBC:  Recent Labs Lab 10/30/15 1010  WBC 9.5  NEUTROABS 8.0*  HGB 12.7  HCT 37.3  MCV 86.3  PLT 230   Basic Metabolic Panel:  Recent Labs Lab 10/30/15 1010  NA 132*  K 3.4*  CL 92*  CO2 30  GLUCOSE 137*  BUN 13  CREATININE 0.61  CALCIUM 8.8*   GFR: Estimated Creatinine Clearance: 60.2 mL/min (by C-G formula based on SCr of 0.8 mg/dL). Liver Function Tests:  Recent Labs Lab 10/30/15 1010  AST 185*  ALT 231*  ALKPHOS 274*  BILITOT 6.3*  PROT 7.8  ALBUMIN 3.4*    Recent Labs Lab 10/30/15 1010  LIPASE 23   No results for input(s): AMMONIA in the last 168 hours. Coagulation Profile: No results for input(s): INR, PROTIME in the last 168 hours. Cardiac Enzymes: No results for input(s): CKTOTAL, CKMB, CKMBINDEX, TROPONINI in the last 168 hours. BNP (last 3 results) No results for input(s): PROBNP in the last 8760 hours. HbA1C: No results for input(s): HGBA1C in the last 72 hours. CBG: No results for input(s): GLUCAP in the last 168 hours. Lipid Profile: No results for input(s): CHOL, HDL, LDLCALC, TRIG, CHOLHDL, LDLDIRECT in the last 72 hours. Thyroid Function Tests: No results for input(s): TSH, T4TOTAL, FREET4, T3FREE, THYROIDAB in the last 72 hours. Anemia Panel: No results for input(s): VITAMINB12, FOLATE, FERRITIN, TIBC, IRON, RETICCTPCT in the last 72 hours. Urine analysis:    Component Value Date/Time   COLORURINE AMBER (A) 10/30/2015 1324   APPEARANCEUR CLEAR 10/30/2015 1324   LABSPEC >1.046 (H) 10/30/2015 1324   PHURINE 7.0 10/30/2015 1324    GLUCOSEU NEGATIVE 10/30/2015 1324   HGBUR TRACE (A) 10/30/2015 1324   BILIRUBINUR LARGE (A) 10/30/2015 1324   KETONESUR NEGATIVE 10/30/2015 1324   PROTEINUR 30 (A) 10/30/2015 1324   UROBILINOGEN 0.2 03/21/2012 1350   NITRITE NEGATIVE 10/30/2015 1324   LEUKOCYTESUR TRACE (A) 10/30/2015 1324   Sepsis Labs: !!!!!!!!!!!!!!!!!!!!!!!!!!!!!!!!!!!!!!!!!!!! @LABRCNTIP (procalcitonin:4,lacticidven:4) )No results found for this or any previous visit (from the past 240 hour(s)).   Radiological Exams on Admission: Ct Abdomen Pelvis W Contrast  Result Date: 10/30/2015 CLINICAL DATA:  Persistent vomiting for 8-10 hours 4 days ago. Now weekend dehydrated. Elevated liver function test. EXAM: CT ABDOMEN AND PELVIS WITH CONTRAST TECHNIQUE: Multidetector CT imaging of the abdomen and pelvis was performed using the standard protocol following bolus administration of intravenous contrast. CONTRAST:  100mL ISOVUE-300 IOPAMIDOL (ISOVUE-300) INJECTION 61% COMPARISON:  None. FINDINGS: Lower chest:  No acute findings. Hepatobiliary: Obstructing mass versus stone in the common bile duct, measuring approximately 1.2 cm, causing marked common bile duct and intrahepatic bile duct dilatation. Gallbladder is moderately distended. Layering stones versus sludge in the gallbladder. No pericholecystic inflammation seen. Pancreas: No pancreatic duct dilatation. No peripancreatic fluid or inflammation. Spleen: Within normal limits in size and appearance. Adrenals/Urinary Tract: Hyperdense mass exophytic to the posterior cortex of the left kidney measures 1.7 x 1.5 cm. No left-sided renal stone or hydronephrosis. Right kidney appears normal without mass, stone or hydronephrosis. No ureteral or bladder calculi identified. Bladder appears normal. Stomach/Bowel: Bowel is normal in caliber. Scattered diverticulosis throughout the colon without evidence of acute diverticulitis. No evidence of bowel wall inflammation. Stomach appears normal.  Appendix is normal. Vascular/Lymphatic: Atherosclerotic changes of the normal caliber abdominal aorta. Mildly prominent lymph nodes within the upper abdomen. Reproductive: Cystic mass within the left adnexa, presumably ovarian, measuring 3.5 x 3.5 cm. Right adnexal region is unremarkable. Other: No free fluid or abscess collections seen. No free intraperitoneal air. Musculoskeletal: Degenerative changes of the thoracolumbar spine, moderate in degree. No acute or suspicious osseous finding. Superficial soft tissues are unremarkable. IMPRESSION: 1. Obstructing hyperdense mass versus stone in the common bile duct, measuring approximately 1.2 cm greatest dimension, causing marked CBD dilatation and intrahepatic bile duct dilatation. No pancreatic duct dilatation, therefore, obstructing stone versus mass is above the level of the pancreatic duct. Recommend GI consultation for possible ERCP. 2. Associated gallbladder distension, containing a small amount of layering stones and/or sludge. No pericholecystic fluid. 3. Mildly prominent lymph nodes within the upper abdomen, largest in the porta hepatis and portacaval regions. These could be reactive or neoplastic in nature, depending on the cause for the CBD obstruction. 4. Cystic mass in the left adnexa, measuring 3.5 x 3.5 cm. Given patient's age, recommend pelvic ultrasound after current issues are resolved to exclude cystic neoplasm of the left ovary. 5. Hyperdense mass exophytic to the posterior cortex of the left kidney, measuring 1.7 x 1.5 cm. Solid neoplastic mass cannot be excluded based on this single exam. Recommend renal MRI for further characterization after current issues are resolved. 6. Colonic diverticulosis without evidence of acute diverticulitis. 7. Aortic atherosclerosis. These results and recommendations were called by telephone at the time of interpretation on 10/30/2015 at 12:33 pm to Dr. Shawna Orleans BELFI , who verbally acknowledged these results.  Electronically Signed   By: Bary Richard M.D.   On: 10/30/2015 12:35    EKG: Independently reviewed. Normal sinus rhythm with no ST elevations or depressions  Assessment/Plan Active Problems:   Common bile duct (CBD) obstruction/Common bile duct dilatation - GI specialists consulted - Plan is for further evaluation recommendations by GI specialist. Transition to MedSurg bed. Provide supportive therapy with anti-emetics and pain medication when necessary. - Clear liquid diet and nothing by mouth after midnight   hypertension - Blood pressures low-normal as such will continue to hold blood pressure medication  CT scan reports prominent lymph nodes within the upper abdomen, cystic mass in the left adnexa and hyperdense mass exophytic to the posterior cortex of the left kidney. Radiologist is recommending further evaluation after current problem has resolved. Please see report for details   DVT prophylaxis: heparin  Code Status: Full Family Communication: Discussed with patient and daughter at bedside  Disposition Plan: pending improvement in condition  Consults called:gastroenterologist Dr. Bosie Clos Admission status: Observation   Penny Pia MD [facility] Pager [phone] after 7 pm please call night  coverage  If 7PM-7AM, please contact night-coverage [email or web address] Password [email or web address]  10/30/2015, 5:51 PM

## 2015-10-30 NOTE — ED Triage Notes (Signed)
Patient states four days (Wed) ago she had a 8 - 10 hour history of vomiting.  States the next day Clovis Cao(Thur) she slept all day.  States since then, she has been able to drink liquids, no solid food, but feels weak and dehydrated. Denies pain.

## 2015-10-30 NOTE — ED Notes (Signed)
Pt on auto VS and cardiac monitor. 

## 2015-10-31 DIAGNOSIS — E876 Hypokalemia: Secondary | ICD-10-CM | POA: Diagnosis present

## 2015-10-31 DIAGNOSIS — B961 Klebsiella pneumoniae [K. pneumoniae] as the cause of diseases classified elsewhere: Secondary | ICD-10-CM | POA: Diagnosis present

## 2015-10-31 DIAGNOSIS — K831 Obstruction of bile duct: Secondary | ICD-10-CM | POA: Diagnosis not present

## 2015-10-31 DIAGNOSIS — K449 Diaphragmatic hernia without obstruction or gangrene: Secondary | ICD-10-CM | POA: Diagnosis present

## 2015-10-31 DIAGNOSIS — K2940 Chronic atrophic gastritis without bleeding: Secondary | ICD-10-CM | POA: Diagnosis present

## 2015-10-31 DIAGNOSIS — R062 Wheezing: Secondary | ICD-10-CM | POA: Diagnosis not present

## 2015-10-31 DIAGNOSIS — K828 Other specified diseases of gallbladder: Secondary | ICD-10-CM | POA: Diagnosis present

## 2015-10-31 DIAGNOSIS — K8031 Calculus of bile duct with cholangitis, unspecified, with obstruction: Secondary | ICD-10-CM | POA: Diagnosis present

## 2015-10-31 DIAGNOSIS — K830 Cholangitis: Secondary | ICD-10-CM | POA: Diagnosis not present

## 2015-10-31 DIAGNOSIS — E860 Dehydration: Secondary | ICD-10-CM | POA: Diagnosis present

## 2015-10-31 DIAGNOSIS — K5710 Diverticulosis of small intestine without perforation or abscess without bleeding: Secondary | ICD-10-CM | POA: Diagnosis present

## 2015-10-31 DIAGNOSIS — Z887 Allergy status to serum and vaccine status: Secondary | ICD-10-CM | POA: Diagnosis not present

## 2015-10-31 DIAGNOSIS — K8051 Calculus of bile duct without cholangitis or cholecystitis with obstruction: Secondary | ICD-10-CM | POA: Diagnosis not present

## 2015-10-31 DIAGNOSIS — K219 Gastro-esophageal reflux disease without esophagitis: Secondary | ICD-10-CM | POA: Diagnosis present

## 2015-10-31 DIAGNOSIS — Z87891 Personal history of nicotine dependence: Secondary | ICD-10-CM | POA: Diagnosis not present

## 2015-10-31 DIAGNOSIS — R740 Nonspecific elevation of levels of transaminase and lactic acid dehydrogenase [LDH]: Secondary | ICD-10-CM | POA: Diagnosis not present

## 2015-10-31 DIAGNOSIS — Z1611 Resistance to penicillins: Secondary | ICD-10-CM | POA: Diagnosis present

## 2015-10-31 DIAGNOSIS — I10 Essential (primary) hypertension: Secondary | ICD-10-CM | POA: Diagnosis present

## 2015-10-31 DIAGNOSIS — Z79899 Other long term (current) drug therapy: Secondary | ICD-10-CM | POA: Diagnosis not present

## 2015-10-31 DIAGNOSIS — K8050 Calculus of bile duct without cholangitis or cholecystitis without obstruction: Secondary | ICD-10-CM | POA: Diagnosis present

## 2015-10-31 DIAGNOSIS — K838 Other specified diseases of biliary tract: Secondary | ICD-10-CM | POA: Diagnosis not present

## 2015-10-31 LAB — BASIC METABOLIC PANEL
ANION GAP: 11 (ref 5–15)
BUN: 9 mg/dL (ref 6–20)
CHLORIDE: 99 mmol/L — AB (ref 101–111)
CO2: 22 mmol/L (ref 22–32)
Calcium: 8.3 mg/dL — ABNORMAL LOW (ref 8.9–10.3)
Creatinine, Ser: 0.57 mg/dL (ref 0.44–1.00)
GFR calc Af Amer: >60 mL/min (ref >60)
GLUCOSE: 128 mg/dL — AB (ref 65–99)
POTASSIUM: 4 mmol/L (ref 3.5–5.1)
Sodium: 132 mmol/L — ABNORMAL LOW (ref 135–145)

## 2015-10-31 LAB — CBC
HEMATOCRIT: 38.3 % (ref 36.0–46.0)
HEMOGLOBIN: 12.4 g/dL (ref 12.0–15.0)
MCH: 28.5 pg (ref 26.0–34.0)
MCHC: 32.4 g/dL (ref 30.0–36.0)
MCV: 88 fL (ref 78.0–100.0)
Platelets: 187 10*3/uL (ref 150–400)
RBC: 4.35 MIL/uL (ref 3.87–5.11)
RDW: 13.8 % (ref 11.5–15.5)
WBC: 8.4 10*3/uL (ref 4.0–10.5)

## 2015-10-31 MED ORDER — SODIUM CHLORIDE 0.9 % IV BOLUS (SEPSIS)
500.0000 mL | Freq: Once | INTRAVENOUS | Status: AC
  Administered 2015-10-31: 500 mL via INTRAVENOUS

## 2015-10-31 MED ORDER — IBUPROFEN 400 MG PO TABS: 400.0000 mg | ORAL_TABLET | ORAL | Status: DC | PRN

## 2015-10-31 MED ORDER — ACETAMINOPHEN 325 MG PO TABS: 650.0000 mg | ORAL_TABLET | ORAL | Status: DC | PRN

## 2015-10-31 MED ORDER — ACETAMINOPHEN 325 MG PO TABS
650.0000 mg | ORAL_TABLET | ORAL | Status: DC | PRN
  Administered 2015-10-31 – 2015-11-09 (×7): 650 mg via ORAL
  Filled 2015-10-31 (×7): qty 2

## 2015-10-31 MED ORDER — ACETAMINOPHEN 10 MG/ML IV SOLN
1000.0000 mg | Freq: Once | INTRAVENOUS | Status: AC
  Administered 2015-10-31: 1000 mg via INTRAVENOUS
  Filled 2015-10-31: qty 100

## 2015-10-31 MED ORDER — SODIUM CHLORIDE 0.9 % IV SOLN
3.0000 g | Freq: Four times a day (QID) | INTRAVENOUS | Status: DC
  Administered 2015-10-31 – 2015-11-10 (×38): 3 g via INTRAVENOUS
  Filled 2015-10-31 (×45): qty 3

## 2015-10-31 NOTE — Care Management CC44 (Signed)
Condition Code 44 Documentation Completed  Patient Details  Name: Amy ShanksGlenda Sheppard MRN: [id number] Date of Birth: 01/21/1941   Condition Code 44 given:  Yes Patient signature on Condition Code 44 notice:  Yes Documentation of 2 MD's agreement:  Yes Code 44 added to claim:  Yes    Lawerance Sabalebbie Grae Cannata, RN 10/31/2015, 11:50 AM

## 2015-10-31 NOTE — Progress Notes (Signed)
PROGRESS NOTE    Amy Sheppard  ZOX:[id number] DOB: 1940-11-06 DOA: 10/30/2015  PCP: Amy Griffes, MD   Brief Narrative:  Amy Sheppard is a 76 y.o. female with medical history significant of hypertension who presents with 5 day complaint of poor oral intake. Started on Wednesday night when patient had nausea and emesis. Has felt weak since. Has had poor oral intake as well. At first she thought it was something she ate Wednesday but the problem has persisted. Given her progressive weakness she decided to come to the hospital for further evaluation recommendations. Nothing she is aware of makes it better eating makes it worse. The problem occurred insidiously.  Subjective: Nausea and vomiting resolved- no abdominal pain. Has a large loose BM today.   Assessment & Plan:   Active Problems:   Common bile duct (CBD) obstruction   Common bile duct dilatation   Cholangitis (fever, tachycardia) - start Unasyn, GI following- planning ERCP  HTN - cont to hold BP meds in setting of sepsis    DVT prophylaxis: SCDS Code Status: full code Family Communication:  Disposition Plan: home  Consultants:   GI Procedures:   none Antimicrobials:  Anti-infectives    Start     Dose/Rate Route Frequency Ordered Stop   10/31/15 1400  Ampicillin-Sulbactam (UNASYN) 3 g in sodium chloride 0.9 % 100 mL IVPB     3 g 100 mL/hr over 60 Minutes Intravenous Every 6 hours 10/31/15 1347         Objective: Vitals:   10/30/15 2141 10/31/15 0528 10/31/15 0740 10/31/15 1320  BP: (!) 97/44 (!) 149/70  119/85  Pulse: 68 88  (!) 101  Resp: Temp: 98.5 F (36.9 C) (!) 101.2 F (38.4 C) 98.5 F (36.9 C) (!) 102.9 F (39.4 C)  TempSrc: Oral Oral Oral Oral  SpO2: 94% 96%  95%  Weight:      Height:        Intake/Output Summary (Last 24 hours) at 10/31/15 1537 Last data filed at 10/31/15 1300  Gross per 24 hour  Intake          1408.33 ml  Output                0 ml  Net          1408.33  ml   Filed Weights   10/30/15 0927 10/30/15 1757  Weight: 77.1 kg (170 lb) 81.2 kg (179 lb)    Examination: General exam: Appears comfortable  HEENT: PERRLA, oral mucosa moist, no sclera icterus or thrush Respiratory system: Clear to auscultation. Respiratory effort normal. Cardiovascular system: S1 & S2 heard, RRR.  No murmurs  Gastrointestinal system: Abdomen soft, non-tender, nondistended. Normal bowel sound. No organomegaly Central nervous system: Alert and oriented. No focal neurological deficits. Extremities: No cyanosis, clubbing or edema Skin: No rashes or ulcers Psychiatry:  Mood & affect appropriate.     Data Reviewed: I have personally reviewed following labs and imaging studies  CBC:  Recent Labs Lab 10/30/15 1010 10/30/15 1819 10/31/15 0516  WBC 9.5 8.8 8.4  NEUTROABS 8.0*  --   --   HGB 12.7 11.7* 12.4  HCT 37.3 35.3* 38.3  MCV 86.3 89.1 88.0  PLT 230 211 187   Basic Metabolic Panel:  Recent Labs Lab 10/30/15 1010 10/30/15 1819 10/31/15 0[name]*  --  132*  K 3.4*  --  4.0  CL 92*  --  99*  CO2 30  --  22  GLUCOSE 137*  --  128*  BUN 13  --  9  CREATININE 0.61 0.67 0.57  CALCIUM 8.8*  --  8.3*  MG  --  2.2  --   PHOS  --  2.0*  --    GFR: Estimated Creatinine Clearance: 61.8 mL/min (by C-G formula based on SCr of 0.8 mg/dL). Liver Function Tests:  Recent Labs Lab 10/30/15 1010  AST 185*  ALT 231*  ALKPHOS 274*  BILITOT 6.3*  PROT 7.8  ALBUMIN 3.4*    Recent Labs Lab 10/30/15 1010  LIPASE 23   No results for input(s): AMMONIA in the last 168 hours. Coagulation Profile: No results for input(s): INR, PROTIME in the last 168 hours. Cardiac Enzymes: No results for input(s): CKTOTAL, CKMB, CKMBINDEX, TROPONINI in the last 168 hours. BNP (last 3 results) No results for input(s): PROBNP in the last 8760 hours. HbA1C: No results for input(s): HGBA1C in the last 72 hours. CBG: No results for input(s): GLUCAP in the last 168  hours. Lipid Profile: No results for input(s): CHOL, HDL, LDLCALC, TRIG, CHOLHDL, LDLDIRECT in the last 72 hours. Thyroid Function Tests: No results for input(s): TSH, T4TOTAL, FREET4, T3FREE, THYROIDAB in the last 72 hours. Anemia Panel: No results for input(s): VITAMINB12, FOLATE, FERRITIN, TIBC, IRON, RETICCTPCT in the last 72 hours. Urine analysis:    Component Value Date/Time   COLORURINE AMBER (A) 10/30/2015 1324   APPEARANCEUR CLEAR 10/30/2015 1324   LABSPEC >1.046 (H) 10/30/2015 1324   PHURINE 7.0 10/30/2015 1324   GLUCOSEU NEGATIVE 10/30/2015 1324   HGBUR TRACE (A) 10/30/2015 1324   BILIRUBINUR LARGE (A) 10/30/2015 1324   KETONESUR NEGATIVE 10/30/2015 1324   PROTEINUR 30 (A) 10/30/2015 1324   UROBILINOGEN 0.2 03/21/2012 1350   NITRITE NEGATIVE 10/30/2015 1324   LEUKOCYTESUR TRACE (A) 10/30/2015 1324   Sepsis Labs: @LABRCNTIP (procalcitonin:4,lacticidven:4) )No results found for this or any previous visit (from the past 240 hour(s)).       Radiology Studies: Ct Abdomen Pelvis W Contrast  Result Date: 10/30/2015 CLINICAL DATA:  Persistent vomiting for 8-10 hours 4 days ago. Now weekend dehydrated. Elevated liver function test. EXAM: CT ABDOMEN AND PELVIS WITH CONTRAST TECHNIQUE: Multidetector CT imaging of the abdomen and pelvis was performed using the standard protocol following bolus administration of intravenous contrast. CONTRAST:  ISOVUE-300 IOPAMIDOL (ISOVUE-300) INJECTION 61% COMPARISON:  None. FINDINGS: Lower chest:  No acute findings. Hepatobiliary: Obstructing mass versus stone in the common bile duct, measuring approximately 1.2 cm, causing marked common bile duct and intrahepatic bile duct dilatation. Gallbladder is moderately distended. Layering stones versus sludge in the gallbladder. No pericholecystic inflammation seen. Pancreas: No pancreatic duct dilatation. No peripancreatic fluid or inflammation. Spleen: Within normal limits in size and appearance.  Adrenals/Urinary Tract: Hyperdense mass exophytic to the posterior cortex of the left kidney measures 1.7 x 1.5 cm. No left-sided renal stone or hydronephrosis. Right kidney appears normal without mass, stone or hydronephrosis. No ureteral or bladder calculi identified. Bladder appears normal. Stomach/Bowel: Bowel is normal in caliber. Scattered diverticulosis throughout the colon without evidence of acute diverticulitis. No evidence of bowel wall inflammation. Stomach appears normal. Appendix is normal. Vascular/Lymphatic: Atherosclerotic changes of the normal caliber abdominal aorta. Mildly prominent lymph nodes within the upper abdomen. Reproductive: Cystic mass within the left adnexa, presumably ovarian, measuring 3.5 x 3.5 cm. Right adnexal region is unremarkable. Other: No free fluid or abscess collections seen. No free intraperitoneal air. Musculoskeletal: Degenerative changes of the thoracolumbar spine, moderate in degree.  No acute or suspicious osseous finding. Superficial soft tissues are unremarkable. IMPRESSION: 1. Obstructing hyperdense mass versus stone in the common bile duct, measuring approximately 1.2 cm greatest dimension, causing marked CBD dilatation and intrahepatic bile duct dilatation. No pancreatic duct dilatation, therefore, obstructing stone versus mass is above the level of the pancreatic duct. Recommend GI consultation for possible ERCP. 2. Associated gallbladder distension, containing a small amount of layering stones and/or sludge. No pericholecystic fluid. 3. Mildly prominent lymph nodes within the upper abdomen, largest in the porta hepatis and portacaval regions. These could be reactive or neoplastic in nature, depending on the cause for the CBD obstruction. 4. Cystic mass in the left adnexa, measuring 3.5 x 3.5 cm. Given patient's age, recommend pelvic ultrasound after current issues are resolved to exclude cystic neoplasm of the left ovary. 5. Hyperdense mass exophytic to the  posterior cortex of the left kidney, measuring 1.7 x 1.5 cm. Solid neoplastic mass cannot be excluded based on this single exam. Recommend renal MRI for further characterization after current issues are resolved. 6. Colonic diverticulosis without evidence of acute diverticulitis. 7. Aortic atherosclerosis. These results and recommendations were called by telephone at the time of interpretation on 10/30/2015 at 12:33 pm to Dr. Shawna OrleansMELANIE BELFI , who verbally acknowledged these results. Electronically Signed   By: Bary RichardStan  Maynard M.D.   On: 10/30/2015 12:35      Scheduled Meds: . ampicillin-sulbactam (UNASYN) IV  3 g Intravenous Q6H  . heparin  5,000 Units Subcutaneous Q8H  . metoCLOPramide  10 mg Oral Q6H   Continuous Infusions: . dextrose 5 % and 0.9 % NaCl with KCl 20 mEq/L 100 mL/hr at 10/31/15 0632     LOS: 1 day    Time spent in minutes: 35    Teea Ducey, MD [facility] Pager: [email or web address] Password [email or web address] 10/31/2015, 3:37 PM

## 2015-10-31 NOTE — Consult Note (Signed)
Subjective:   HPI  The patient is a 75 year old female who was admitted to the hospital after going to the emergency room last night because she had not been feeling well and had poor oral intake for the last several days. She had some nausea and vomiting about 5 days ago after eating something and had some upper abdominal discomfort. In the emergency room she was found to have a total bilirubin of 6.3, alkaline phosphatase 274, ALT 231, AST 185. A CT scan of the abdomen was done which showed stones versus sludge in the gallbladder and a 1.2 cm obstructing stone versus mass in the common bile duct with proximal biliary dilatation.  She reports that she has seen a gastroenterologist in [place] in the past. Earlier this year she had an EGD with esophageal dilation because she was having some dysphagia issues. She is no longer having dysphagia issues after the dilation.  Review of Systems No chest pain or shortness of breath  Past Medical History:  Diagnosis Date  . Hypertension   . Lymphocytic colitis   . UTI (lower urinary tract infection)    Past Surgical History:  Procedure Laterality Date  . TUBAL LIGATION     Social History   Social History  . Marital status: Married    Spouse name: N/A  . Number of children: N/A  . Years of education: N/A   Occupational History  . Not on file.   Social History Main Topics  . Smoking status: Former Games developermoker  . Smokeless tobacco: Never Used  . Alcohol use No  . Drug use: No  . Sexual activity: Not on file   Other Topics Concern  . Not on file   Social History Narrative  . No narrative on file   family history is not on file.  Current Facility-Administered Medications:  .  dextrose 5 % and 0.9 % NaCl with KCl 20 mEq/L infusion, , Intravenous, Continuous, Penny Piarlando Vega, MD, Last Rate: 100 mL/hr at 10/31/15 [id number] .  heparin injection 5,000 Units, 5,000 Units, Subcutaneous, Q8H, Penny Piarlando Vega, MD, 5,000 Units at 10/31/15 0551 .   HYDROcodone-acetaminophen (NORCO/VICODIN) 5-325 MG per tablet 1-2 tablet, 1-2 tablet, Oral, Q4H PRN, Penny Piarlando Vega, MD, 1 tablet at 10/30/15 1927 .  metoCLOPramide (REGLAN) tablet 10 mg, 10 mg, Oral, Q6H, Penny Piarlando Vega, MD, 10 mg at 10/30/15 2329 .  ondansetron (ZOFRAN) tablet 4 mg, 4 mg, Oral, Q6H PRN **OR** ondansetron (ZOFRAN) injection 4 mg, 4 mg, Intravenous, Q6H PRN, Penny Piarlando Vega, MD Allergies  Allergen Reactions  . Tetanus Toxoids Swelling    WELTS     Objective:     BP (!) 149/70 (BP Location: Right Arm)   Pulse 88   Temp 98.5 F (36.9 C) (Oral)   Resp 16   Ht 5\' 2"  (1.575 m)   Wt 81.2 kg (179 lb)   SpO2 96%   BMI 32.74 kg/m   She is in no acute distress  Scleral icterus  Heart regular rhythm no murmurs  Lungs clear  Abdomen, bowel sounds present, soft, nontender no masses  Laboratory No components found for: D1    Assessment:     Obstructive jaundice, with findings of a stone versus mass in the common bile duct.      Plan:     I believe this patient needs an ERCP. I explained the procedure to her along with the potential risks of bleeding, perforation, infection, and pancreatitis. She understands and consents to the procedure. I will try to  coordinate this with one of our biliary endoscopists, and the endoscopy department to see when there would be availability to do this.

## 2015-10-31 NOTE — Care Management Note (Addendum)
Case Management Note  Patient Details  Name: Amy ShanksGlenda Sheppard MRN: [id number] Date of Birth: 03-18-1941  Subjective/Objective:                 Spoke with patient at the bedside. She [place], [place], she drives, [facility], does not have and not anticipate needing DME. RN report patient needs assistance with ambulation. PT order pending.  Addendum 11-01-15, Per RN at bedside, patient is much more stable when walking today and would not need PT eval. Addendum- 11-03-15 Patient o be seen by IR for consult for biliary drain and possible biopsy. Obstructive jaundice from stone vs mass. [facility] for lithotripsy yesterday unsuccessful.   Action/Plan:  CM will continue to follow for DC planning. [facility] need [facility] for help managing drain if DC with one.    Expected Discharge Date:                  Expected Discharge Plan:  Home/Self Care  In-House Referral:     Discharge planning Services  CM Consult  Post Acute Care Choice:    Choice offered to:     DME Arranged:    DME Agency:     HH Arranged:    HH Agency:     Status of Service:  In process, will continue to follow  If discussed at Long Length of Stay Meetings, dates discussed:    Additional Comments:  Lawerance SabalDebbie Ranen Doolin, RN 10/31/2015, 1:14 PM

## 2015-10-31 NOTE — Progress Notes (Signed)
Pharmacy Antibiotic Note  Amy ShanksGlenda Sheppard is a 75 y.o. female admitted on 10/30/2015 with nausea and poor oral intake.  Pharmacy has been consulted for Unasyn dosing for intra-abdominal infection. CT scan reveals stone vs mass in the CBD. GI consulted and recommending ERCP.  Plan: Unasyn 3 g IV q6h  Monitor renal function and clinical progress   Height: 5\' 2"  (157.5 cm) Weight: 179 lb (81.2 kg) IBW/kg (Calculated) : 50.1  Temp (24hrs), Avg:99.9 F (37.7 C), Min:98.4 F (36.9 C), Max:102.9 F (39.4 C)   Recent Labs Lab 10/30/15 1010 10/30/15 1819 10/31/15 0516  WBC 9.5 8.8 8.4  CREATININE 0.61 0.67 0.57    Estimated Creatinine Clearance: 61.8 mL/min (by C-G formula based on SCr of 0.8 mg/dL).    Allergies  Allergen Reactions  . Tetanus Toxoids Swelling    WELTS    Antimicrobials this admission: Unasyn 8/7 >>    Dose adjustments this admission:   Microbiology results: 8/7 BCx: collected   Thank you for allowing pharmacy to be a part of this patient's care.  Loura BackJennifer Rolla, PharmD, BCPS Clinical Pharmacist Phone for today [phone] Main pharmacy - [phone] 10/31/2015 1:42 PM

## 2015-11-01 ENCOUNTER — Inpatient Hospital Stay (HOSPITAL_COMMUNITY): Payer: Medicare Other

## 2015-11-01 ENCOUNTER — Encounter (HOSPITAL_COMMUNITY): Payer: Self-pay | Admitting: General Practice

## 2015-11-01 DIAGNOSIS — R062 Wheezing: Secondary | ICD-10-CM

## 2015-11-01 DIAGNOSIS — R740 Nonspecific elevation of levels of transaminase and lactic acid dehydrogenase [LDH]: Secondary | ICD-10-CM

## 2015-11-01 DIAGNOSIS — R7401 Elevation of levels of liver transaminase levels: Secondary | ICD-10-CM | POA: Diagnosis present

## 2015-11-01 DIAGNOSIS — E876 Hypokalemia: Secondary | ICD-10-CM | POA: Clinically undetermined

## 2015-11-01 DIAGNOSIS — K831 Obstruction of bile duct: Secondary | ICD-10-CM | POA: Diagnosis present

## 2015-11-01 LAB — CBC WITH DIFFERENTIAL/PLATELET
BASOS ABS: 0 10*3/uL (ref 0.0–0.1)
BASOS PCT: 0 %
EOS ABS: 0 10*3/uL (ref 0.0–0.7)
Eosinophils Relative: 0 %
HEMATOCRIT: 33.7 % — AB (ref 36.0–46.0)
Hemoglobin: 11.1 g/dL — ABNORMAL LOW (ref 12.0–15.0)
LYMPHS ABS: 0.4 10*3/uL — AB (ref 0.7–4.0)
Lymphocytes Relative: 3 %
MCH: 28.6 pg (ref 26.0–34.0)
MCHC: 32.9 g/dL (ref 30.0–36.0)
MCV: 86.9 fL (ref 78.0–100.0)
MONOS PCT: 9 %
Monocytes Absolute: 1 10*3/uL (ref 0.1–1.0)
NEUTROS ABS: 9.7 10*3/uL — AB (ref 1.7–7.7)
NEUTROS PCT: 88 %
PLATELETS: 191 10*3/uL (ref 150–400)
RBC: 3.88 MIL/uL (ref 3.87–5.11)
RDW: 14 % (ref 11.5–15.5)
WBC: 11.1 10*3/uL — AB (ref 4.0–10.5)

## 2015-11-01 LAB — COMPREHENSIVE METABOLIC PANEL
ALT: 142 U/L — ABNORMAL HIGH (ref 14–54)
ANION GAP: 8 (ref 5–15)
AST: 81 U/L — ABNORMAL HIGH (ref 15–41)
Albumin: 2.3 g/dL — ABNORMAL LOW (ref 3.5–5.0)
Alkaline Phosphatase: 257 U/L — ABNORMAL HIGH (ref 38–126)
BUN: 6 mg/dL (ref 6–20)
CHLORIDE: 102 mmol/L (ref 101–111)
CO2: 25 mmol/L (ref 22–32)
Calcium: 8 mg/dL — ABNORMAL LOW (ref 8.9–10.3)
Creatinine, Ser: 0.49 mg/dL (ref 0.44–1.00)
Glucose, Bld: 116 mg/dL — ABNORMAL HIGH (ref 65–99)
Potassium: 3.1 mmol/L — ABNORMAL LOW (ref 3.5–5.1)
SODIUM: 135 mmol/L (ref 135–145)
Total Bilirubin: 7.4 mg/dL — ABNORMAL HIGH (ref 0.3–1.2)
Total Protein: 5.6 g/dL — ABNORMAL LOW (ref 6.5–8.1)

## 2015-11-01 LAB — MAGNESIUM: Magnesium: 2.1 mg/dL (ref 1.7–2.4)

## 2015-11-01 LAB — PHOSPHORUS: Phosphorus: 1.3 mg/dL — ABNORMAL LOW (ref 2.5–4.6)

## 2015-11-01 MED ORDER — PANTOPRAZOLE SODIUM 40 MG PO TBEC
40.0000 mg | DELAYED_RELEASE_TABLET | Freq: Every day | ORAL | Status: DC
  Administered 2015-11-01 – 2015-11-10 (×9): 40 mg via ORAL
  Filled 2015-11-01 (×9): qty 1

## 2015-11-01 MED ORDER — METHYLPREDNISOLONE SODIUM SUCC 125 MG IJ SOLR
60.0000 mg | Freq: Once | INTRAMUSCULAR | Status: AC
  Administered 2015-11-01: 60 mg via INTRAVENOUS
  Filled 2015-11-01: qty 2

## 2015-11-01 MED ORDER — IPRATROPIUM-ALBUTEROL 0.5-2.5 (3) MG/3ML IN SOLN
3.0000 mL | Freq: Four times a day (QID) | RESPIRATORY_TRACT | Status: DC
  Administered 2015-11-01: 3 mL via RESPIRATORY_TRACT
  Filled 2015-11-01 (×2): qty 3

## 2015-11-01 MED ORDER — POTASSIUM CHLORIDE CRYS ER 20 MEQ PO TBCR
40.0000 meq | EXTENDED_RELEASE_TABLET | ORAL | Status: AC
  Administered 2015-11-01 (×2): 40 meq via ORAL
  Filled 2015-11-01 (×2): qty 2

## 2015-11-01 MED ORDER — GABAPENTIN 300 MG PO CAPS
300.0000 mg | ORAL_CAPSULE | Freq: Two times a day (BID) | ORAL | Status: DC
  Administered 2015-11-01 – 2015-11-10 (×18): 300 mg via ORAL
  Filled 2015-11-01 (×18): qty 1

## 2015-11-01 MED ORDER — ATENOLOL 50 MG PO TABS
25.0000 mg | ORAL_TABLET | Freq: Every day | ORAL | Status: DC
  Administered 2015-11-01 – 2015-11-10 (×9): 25 mg via ORAL
  Filled 2015-11-01 (×9): qty 1

## 2015-11-01 MED ORDER — SODIUM PHOSPHATES 45 MMOLE/15ML IV SOLN
30.0000 mmol | Freq: Once | INTRAVENOUS | Status: AC
  Administered 2015-11-01: 30 mmol via INTRAVENOUS
  Filled 2015-11-01: qty 10

## 2015-11-01 MED ORDER — ASPIRIN EC 81 MG PO TBEC
81.0000 mg | DELAYED_RELEASE_TABLET | Freq: Every day | ORAL | Status: DC
  Administered 2015-11-01 – 2015-11-09 (×9): 81 mg via ORAL
  Filled 2015-11-01 (×9): qty 1

## 2015-11-01 MED ORDER — BUDESONIDE 0.25 MG/2ML IN SUSP
0.2500 mg | Freq: Two times a day (BID) | RESPIRATORY_TRACT | Status: DC
  Administered 2015-11-01 – 2015-11-10 (×18): 0.25 mg via RESPIRATORY_TRACT
  Filled 2015-11-01 (×18): qty 2

## 2015-11-01 MED ORDER — ARFORMOTEROL TARTRATE 15 MCG/2ML IN NEBU
15.0000 ug | INHALATION_SOLUTION | Freq: Two times a day (BID) | RESPIRATORY_TRACT | Status: DC
  Administered 2015-11-01 – 2015-11-10 (×18): 15 ug via RESPIRATORY_TRACT
  Filled 2015-11-01 (×18): qty 2

## 2015-11-01 MED ORDER — IPRATROPIUM-ALBUTEROL 0.5-2.5 (3) MG/3ML IN SOLN: 3.0000 mL | RESPIRATORY_TRACT | Status: DC | PRN

## 2015-11-01 NOTE — Progress Notes (Signed)
[facility] by 2pm 11/02/15.

## 2015-11-01 NOTE — Progress Notes (Signed)
Pharmacy consulted to replete phosphorus. K 3.1 - to get kdur 40 po x 2 dose Na 135, mag 2.1, phos 1.3 Plan: Naphos 30 mMols x1 , recheck bmet, mag and phos in am  Herby AbrahamMichelle T. Raivyn Kabler, Pharm.D. [phone] 11/01/2015 12:20 PM

## 2015-11-01 NOTE — Progress Notes (Signed)
PROGRESS NOTE    Amy ShanksGlenda Sheppard  ZOX:[id number] DOB: 1940/09/26 DOA: 10/30/2015 PCP: Amy GriffesBURNS,KEVIN, MD    Brief Narrative:  Amy AsalGlenda Bellamyis a 75 y.o.femalewith medical history significant of hypertension who presents with 5 day complaint of poor oral intake. Started on Wednesday night when patient had nausea and emesis. Has felt weak since. Has had poor oral intake as well. At first she thought it was something she ate Wednesday but the problem has persisted. Given her progressive weakness she decided to come to the hospital for further evaluation recommendations. Nothing she is aware of makes it better eating makes it worse.The problem occurred insidiously.   Assessment & Plan:   Principal Problem:   Obstructive jaundice Active Problems:   Common bile duct (CBD) obstruction   Common bile duct dilatation   Transaminitis   Hypophosphatemia   Hypokalemia   Wheezing   #1 obstructive jaundice Patient had presented with nausea vomiting noted to be jaundiced. Patient noted to have a transaminitis. CT abdomen and pelvis with an obstructing hyperdense mass versus stone in the common bile duct measuring 1.2 cm causing market CBD dilatation and intrahepatic bile duct dilatation. LFTs trending down. Patient has been assessed by GI and patient for ERCP further evaluation and management tomorrow.  #2 hypokalemia/hypophosphatemia Likely secondary to GI losses. Replete.  #3 transaminitis Likely secondary to problem #1.  #4 wheezing Patient noted to be wheezing on examination. Patient with a prior tobacco history. Will give a dose of Solu-Medrol 60 mg IV 1. Place on scheduled nebulizers. Pulmicort. Brovana. Follow.   DVT prophylaxis: Heparin Code Status: Full Family Communication: Updated patient and assist at bedside. Disposition Plan: Home when workup is complete a medically stable.   Consultants:   Gastroenterology: Dr. Evette CristalGanem 10/31/2015  Procedures:   CT abdomen and pelvis  10/30/2015  Chest x-ray 11/01/2015  Antimicrobials:   IV Unasyn 10/31/2015   Subjective: Patient denies any further nausea or emesis. Patient denies any abdominal pain. Patient tolerating clear liquids.  Objective: Vitals:   11/01/15 0502 11/01/15 1307 11/01/15 1539 11/01/15 2008  BP: (!) 131/98 130/90 140/76   Pulse: 88 85 95   Resp: 18  18   Temp: 98.9 F (37.2 C)  99.6 F (37.6 C)   TempSrc: Oral  Oral   SpO2: 98%  95% 97%  Weight:      Height:        Intake/Output Summary (Last 24 hours) at 11/01/15 2033 Last data filed at 11/01/15 1754  Gross per 24 hour  Intake             1670 ml  Output             1400 ml  Net              270 ml   Filed Weights   10/30/15 0927 10/30/15 1757  Weight: 77.1 kg (170 lb) 81.2 kg (179 lb)    Examination:  General exam: Appears calm and comfortable.Slightly jaundiced Respiratory system: Expiratory wheezing. Respiratory effort normal. Cardiovascular system: S1 & S2 heard, RRR. No JVD, murmurs, rubs, gallops or clicks. No pedal edema. Gastrointestinal system: Abdomen is nondistended, soft and nontender. No organomegaly or masses felt. Normal bowel sounds heard. Central nervous system: Alert and oriented. No focal neurological deficits. Extremities: Symmetric 5 x 5 power. Skin: No rashes, lesions or ulcers Psychiatry: Judgement and insight appear normal. Mood & affect appropriate.     Data Reviewed: I have personally reviewed following labs and imaging studies  CBC:  Recent Labs Lab 10/30/15 1010 10/30/15 1819 10/31/15 0516 11/01/15 1040  WBC 9.5 8.8 8.4 11.1*  NEUTROABS 8.0*  --   --  9.7*  HGB 12.7 11.7* 12.4 11.1*  HCT 37.3 35.3* 38.3 33.7*  MCV 86.3 89.1 88.0 86.9  PLT 230 211 187 191   Basic Metabolic Panel:  Recent Labs Lab 10/30/15 1010 10/30/15 1819 10/31/15 0516 11/01/15 1040  NA 132*  --  132* 135  K 3.4*  --  4.0 3.1*  CL 92*  --  99* 102  CO2 30  --  22 25  GLUCOSE 137*  --  128* 116*  BUN  13  --  9 6  CREATININE 0.61 0.67 0.57 0.49  CALCIUM 8.8*  --  8.3* 8.0*  MG  --  2.2  --  2.1  PHOS  --  2.0*  --  1.3*   GFR: Estimated Creatinine Clearance: 61.8 mL/min (by C-G formula based on SCr of 0.8 mg/dL). Liver Function Tests:  Recent Labs Lab 10/30/15 1010 11/01/15 1040  AST 185* 81*  ALT 231* 142*  ALKPHOS 274* 257*  BILITOT 6.3* 7.4*  PROT 7.8 5.6*  ALBUMIN 3.4* 2.3*    Recent Labs Lab 10/30/15 1010  LIPASE 23   No results for input(s): AMMONIA in the last 168 hours. Coagulation Profile: No results for input(s): INR, PROTIME in the last 168 hours. Cardiac Enzymes: No results for input(s): CKTOTAL, CKMB, CKMBINDEX, TROPONINI in the last 168 hours. BNP (last 3 results) No results for input(s): PROBNP in the last 8760 hours. HbA1C: No results for input(s): HGBA1C in the last 72 hours. CBG: No results for input(s): GLUCAP in the last 168 hours. Lipid Profile: No results for input(s): CHOL, HDL, LDLCALC, TRIG, CHOLHDL, LDLDIRECT in the last 72 hours. Thyroid Function Tests: No results for input(s): TSH, T4TOTAL, FREET4, T3FREE, THYROIDAB in the last 72 hours. Anemia Panel: No results for input(s): VITAMINB12, FOLATE, FERRITIN, TIBC, IRON, RETICCTPCT in the last 72 hours. Sepsis Labs: No results for input(s): PROCALCITON, LATICACIDVEN in the last 168 hours.  Recent Results (from the past 240 hour(s))  Culture, blood (routine x 2)     Status: None (Preliminary result)   Collection Time: 10/31/15  6:40 AM  Result Value Ref Range Status   Specimen Description BLOOD LEFT HAND  Final   Special Requests IN PEDIATRIC BOTTLE 0.1CC  Final   Culture NO GROWTH 1 DAY  Final   Report Status PENDING  Incomplete  Culture, blood (routine x 2)     Status: None (Preliminary result)   Collection Time: 10/31/15  6:53 AM  Result Value Ref Range Status   Specimen Description BLOOD LEFT HAND  Final   Special Requests IN PEDIATRIC BOTTLE 0.2CC  Final   Culture NO GROWTH 1  DAY  Final   Report Status PENDING  Incomplete         Radiology Studies: Dg Chest 2 View  Result Date: 11/01/2015 CLINICAL DATA:  Patient minute 5 days ago for vomiting and weakness EXAM: CHEST  2 VIEW COMPARISON:  March 21, 2012 FINDINGS: The heart size and mediastinal contours are within normal limits. Both lungs are clear. The visualized skeletal structures are unremarkable. IMPRESSION: No active cardiopulmonary disease. Electronically Signed   By: Gerome Sam III M.D   On: 11/01/2015 14:28        Scheduled Meds: . ampicillin-sulbactam (UNASYN) IV  3 g Intravenous Q6H  . arformoterol  15 mcg Nebulization BID  .  aspirin EC  81 mg Oral QHS  . atenolol  25 mg Oral Daily  . budesonide (PULMICORT) nebulizer solution  0.25 mg Nebulization BID  . gabapentin  300 mg Oral BID  . heparin  5,000 Units Subcutaneous Q8H  . ipratropium-albuterol  3 mL Nebulization Q6H  . metoCLOPramide  10 mg Oral Q6H  . pantoprazole  40 mg Oral Daily   Continuous Infusions: . dextrose 5 % and 0.9 % NaCl with KCl 20 mEq/L 75 mL/hr at 11/01/15 1232     LOS: 2 days    Time spent: 35 minutes    Sinia Antosh, MD [facility] Pager [phone]  If 7PM-7AM, please contact night-coverage [email or web address] Password [facility] 11/01/2015, 8:33 PM

## 2015-11-01 NOTE — Anesthesia Preprocedure Evaluation (Addendum)
Anesthesia Evaluation  Patient identified by MRN, date of birth, ID band Patient awake    Reviewed: Allergy & Precautions, H&P , NPO status , Patient's Chart, lab work & pertinent test results, reviewed documented beta blocker date and time   Airway Mallampati: II  TM Distance: >3 FB Neck ROM: full    Dental no notable dental hx. (+) Dental Advisory Given, Teeth Intact   Pulmonary neg pulmonary ROS, former smoker,    Pulmonary exam normal breath sounds clear to auscultation       Cardiovascular hypertension (medications being held in setting of sepsis), Pt. on home beta blockers Normal cardiovascular exam Rhythm:regular Rate:Normal     Neuro/Psych negative neurological ROS  negative psych ROS   GI/Hepatic negative GI ROS, Neg liver ROS, GERD  Medicated and Controlled,Cholangitis, obstructive jaundice   Endo/Other  negative endocrine ROS  Renal/GU negative Renal ROS  negative genitourinary   Musculoskeletal   Abdominal   Peds  Hematology negative hematology ROS (+) anemia ,   Anesthesia Other Findings Sepsis, on Unasyn  Reproductive/Obstetrics negative OB ROS                            Anesthesia Physical Anesthesia Plan  ASA: III  Anesthesia Plan: General   Post-op Pain Management:    Induction: Intravenous  Airway Management Planned: Oral ETT  Additional Equipment:   Intra-op Plan:   Post-operative Plan: Extubation in OR  Informed Consent: I have reviewed the patients History and Physical, chart, labs and discussed the procedure including the risks, benefits and alternatives for the proposed anesthesia with the patient or authorized representative who has indicated his/her understanding and acceptance.   Dental advisory given  Plan Discussed with: CRNA  Anesthesia Plan Comments:        Anesthesia Quick Evaluation

## 2015-11-01 NOTE — Progress Notes (Signed)
Patient doing ok today. [facility]. Dr Ewing SchleinMagod will do it. [facility] will be [facility] .

## 2015-11-02 ENCOUNTER — Encounter (HOSPITAL_COMMUNITY): Payer: Self-pay | Admitting: *Deleted

## 2015-11-02 ENCOUNTER — Encounter (HOSPITAL_COMMUNITY)
Admission: EM | Disposition: A | Discharge status: Home Health | Payer: Self-pay | Source: Home / Self Care | Attending: Internal Medicine

## 2015-11-02 ENCOUNTER — Inpatient Hospital Stay (HOSPITAL_COMMUNITY): Payer: Medicare Other | Admitting: Anesthesiology

## 2015-11-02 ENCOUNTER — Inpatient Hospital Stay (HOSPITAL_COMMUNITY): Payer: Medicare Other

## 2015-11-02 DIAGNOSIS — I10 Essential (primary) hypertension: Secondary | ICD-10-CM

## 2015-11-02 HISTORY — PX: ESOPHAGOGASTRODUODENOSCOPY: SHX5428

## 2015-11-02 LAB — CBC WITH DIFFERENTIAL/PLATELET
BASOS ABS: 0 10*3/uL (ref 0.0–0.1)
BASOS PCT: 0 %
EOS ABS: 0 10*3/uL (ref 0.0–0.7)
EOS PCT: 0 %
HCT: 33.3 % — ABNORMAL LOW (ref 36.0–46.0)
Hemoglobin: 11.2 g/dL — ABNORMAL LOW (ref 12.0–15.0)
LYMPHS PCT: 6 %
Lymphs Abs: 1 10*3/uL (ref 0.7–4.0)
MCH: 29.2 pg (ref 26.0–34.0)
MCHC: 33.6 g/dL (ref 30.0–36.0)
MCV: 86.9 fL (ref 78.0–100.0)
MONO ABS: 0.9 10*3/uL (ref 0.1–1.0)
Monocytes Relative: 5 %
Neutro Abs: 14.1 10*3/uL — ABNORMAL HIGH (ref 1.7–7.7)
Neutrophils Relative %: 89 %
PLATELETS: 224 10*3/uL (ref 150–400)
RBC: 3.83 MIL/uL — ABNORMAL LOW (ref 3.87–5.11)
RDW: 14.2 % (ref 11.5–15.5)
WBC: 16 10*3/uL — ABNORMAL HIGH (ref 4.0–10.5)

## 2015-11-02 LAB — HEPATIC FUNCTION PANEL
ALK PHOS: 257 U/L — AB (ref 38–126)
ALT: 129 U/L — AB (ref 14–54)
AST: 86 U/L — ABNORMAL HIGH (ref 15–41)
Albumin: 2 g/dL — ABNORMAL LOW (ref 3.5–5.0)
BILIRUBIN DIRECT: 4.6 mg/dL — AB (ref 0.1–0.5)
BILIRUBIN INDIRECT: 2.5 mg/dL — AB (ref 0.3–0.9)
BILIRUBIN TOTAL: 7.1 mg/dL — AB (ref 0.3–1.2)
Total Protein: 5.5 g/dL — ABNORMAL LOW (ref 6.5–8.1)

## 2015-11-02 LAB — BASIC METABOLIC PANEL
Anion gap: 7 (ref 5–15)
BUN: 6 mg/dL (ref 6–20)
CALCIUM: 7.7 mg/dL — AB (ref 8.9–10.3)
CO2: 25 mmol/L (ref 22–32)
CREATININE: 0.45 mg/dL (ref 0.44–1.00)
Chloride: 100 mmol/L — ABNORMAL LOW (ref 101–111)
GFR calc Af Amer: >60 mL/min (ref >60)
GLUCOSE: 149 mg/dL — AB (ref 65–99)
Potassium: 3.5 mmol/L (ref 3.5–5.1)
Sodium: 132 mmol/L — ABNORMAL LOW (ref 135–145)

## 2015-11-02 LAB — PHOSPHORUS: Phosphorus: 2 mg/dL — ABNORMAL LOW (ref 2.5–4.6)

## 2015-11-02 LAB — MAGNESIUM: Magnesium: 2 mg/dL (ref 1.7–2.4)

## 2015-11-02 SURGERY — EGD (ESOPHAGOGASTRODUODENOSCOPY)
Anesthesia: General

## 2015-11-02 MED ORDER — LACTATED RINGERS IV SOLN
INTRAVENOUS | Status: DC | PRN
  Administered 2015-11-02: 15:00:00 via INTRAVENOUS

## 2015-11-02 MED ORDER — LIDOCAINE HCL (CARDIAC) 20 MG/ML IV SOLN
INTRAVENOUS | Status: AC
  Filled 2015-11-02: qty 5

## 2015-11-02 MED ORDER — FENTANYL CITRATE (PF) 100 MCG/2ML IJ SOLN: 25.0000 ug | INTRAMUSCULAR | Status: DC | PRN

## 2015-11-02 MED ORDER — ROCURONIUM BROMIDE 100 MG/10ML IV SOLN
INTRAVENOUS | Status: DC | PRN
  Administered 2015-11-02: 5 mg via INTRAVENOUS
  Administered 2015-11-02: 15 mg via INTRAVENOUS

## 2015-11-02 MED ORDER — LIDOCAINE HCL (CARDIAC) 20 MG/ML IV SOLN
INTRAVENOUS | Status: DC | PRN
  Administered 2015-11-02: 60 mg via INTRAVENOUS

## 2015-11-02 MED ORDER — SODIUM PHOSPHATES 45 MMOLE/15ML IV SOLN
15.0000 mmol | Freq: Once | INTRAVENOUS | Status: DC
  Filled 2015-11-02 (×2): qty 5

## 2015-11-02 MED ORDER — POTASSIUM CHLORIDE CRYS ER 20 MEQ PO TBCR
40.0000 meq | EXTENDED_RELEASE_TABLET | Freq: Once | ORAL | Status: AC
  Administered 2015-11-02: 40 meq via ORAL
  Filled 2015-11-02: qty 2

## 2015-11-02 MED ORDER — GLUCAGON HCL RDNA (DIAGNOSTIC) 1 MG IJ SOLR
INTRAMUSCULAR | Status: AC
  Filled 2015-11-02: qty 1

## 2015-11-02 MED ORDER — FENTANYL CITRATE (PF) 250 MCG/5ML IJ SOLN
INTRAMUSCULAR | Status: AC
  Filled 2015-11-02: qty 5

## 2015-11-02 MED ORDER — PROPOFOL 10 MG/ML IV BOLUS
INTRAVENOUS | Status: DC | PRN
  Administered 2015-11-02: 110 mg via INTRAVENOUS

## 2015-11-02 MED ORDER — EPHEDRINE SULFATE 50 MG/ML IJ SOLN
INTRAMUSCULAR | Status: DC | PRN
  Administered 2015-11-02 (×3): 5 mg via INTRAVENOUS

## 2015-11-02 MED ORDER — HYDRALAZINE HCL 20 MG/ML IJ SOLN
10.0000 mg | Freq: Four times a day (QID) | INTRAMUSCULAR | Status: DC | PRN
Start: 2015-11-02 — End: 2015-11-10

## 2015-11-02 MED ORDER — PROPOFOL 10 MG/ML IV BOLUS
INTRAVENOUS | Status: AC
  Filled 2015-11-02: qty 20

## 2015-11-02 MED ORDER — CIPROFLOXACIN IN D5W 400 MG/200ML IV SOLN
INTRAVENOUS | Status: AC
  Filled 2015-11-02: qty 200

## 2015-11-02 MED ORDER — SUGAMMADEX SODIUM 200 MG/2ML IV SOLN
INTRAVENOUS | Status: DC | PRN
  Administered 2015-11-02: 160 mg via INTRAVENOUS

## 2015-11-02 MED ORDER — ONDANSETRON HCL 4 MG/2ML IJ SOLN
INTRAMUSCULAR | Status: AC
  Filled 2015-11-02: qty 2

## 2015-11-02 MED ORDER — SODIUM CHLORIDE 0.9 % IV SOLN
INTRAVENOUS | Status: DC
  Administered 2015-11-02: 19:00:00 via INTRAVENOUS

## 2015-11-02 MED ORDER — POTASSIUM CHLORIDE CRYS ER 10 MEQ PO TBCR
10.0000 meq | EXTENDED_RELEASE_TABLET | Freq: Every day | ORAL | Status: DC
  Administered 2015-11-03 – 2015-11-10 (×7): 10 meq via ORAL
  Filled 2015-11-02 (×9): qty 1

## 2015-11-02 MED ORDER — SUGAMMADEX SODIUM 200 MG/2ML IV SOLN
INTRAVENOUS | Status: AC
  Filled 2015-11-02: qty 2

## 2015-11-02 MED ORDER — HEPARIN SODIUM (PORCINE) 5000 UNIT/ML IJ SOLN
5000.0000 [IU] | Freq: Once | INTRAMUSCULAR | Status: AC
  Administered 2015-11-02: 5000 [IU] via SUBCUTANEOUS
  Filled 2015-11-02 (×2): qty 1

## 2015-11-02 MED ORDER — SUCCINYLCHOLINE CHLORIDE 20 MG/ML IJ SOLN
INTRAMUSCULAR | Status: DC | PRN
  Administered 2015-11-02: 100 mg via INTRAVENOUS

## 2015-11-02 MED ORDER — FENTANYL CITRATE (PF) 100 MCG/2ML IJ SOLN
INTRAMUSCULAR | Status: DC | PRN
  Administered 2015-11-02: 50 ug via INTRAVENOUS

## 2015-11-02 MED ORDER — AMLODIPINE BESYLATE 5 MG PO TABS
5.0000 mg | ORAL_TABLET | Freq: Every day | ORAL | Status: DC
  Administered 2015-11-02 – 2015-11-10 (×8): 5 mg via ORAL
  Filled 2015-11-02 (×8): qty 1

## 2015-11-02 MED ORDER — IPRATROPIUM-ALBUTEROL 0.5-2.5 (3) MG/3ML IN SOLN
3.0000 mL | Freq: Three times a day (TID) | RESPIRATORY_TRACT | Status: DC
  Administered 2015-11-02 – 2015-11-03 (×3): 3 mL via RESPIRATORY_TRACT
  Filled 2015-11-02 (×3): qty 3

## 2015-11-02 MED ORDER — INDOMETHACIN 50 MG RE SUPP
RECTAL | Status: AC
  Filled 2015-11-02: qty 2

## 2015-11-02 MED ORDER — ROCURONIUM BROMIDE 100 MG/10ML IV SOLN
INTRAVENOUS | Status: AC
  Filled 2015-11-02: qty 1

## 2015-11-02 NOTE — Progress Notes (Signed)
Amy ShanksGlenda Sheppard 3:52 PM  Subjective: Patient seen and examined and case discussed with her sister and case discussed with my partner Dr. Evette CristalGanem and her hospital computer chart was reviewed and she has no new complaints and we discussed her previous EGD and dilation and her swallowing has been better since and she is only been sick since Wednesday  Objective: Vital signs stable afebrile no acute distress exam please see preassessment evaluation labs and CT reviewed  Assessment: CBD  Obstruction questionable stone versus tumor  Plan: The risks benefits methods and success rate of ERCP was discussed with the patient and her sister and will proceed this evening with further workup plans and recommendations pending those findings  [facility] KatyMAGOD,Amy Sheppard  Pager [phone] After 5PM or if no answer call [phone]

## 2015-11-02 NOTE — Transfer of Care (Signed)
Immediate Anesthesia Transfer of Care Note  Patient: Amy ShanksGlenda Sheppard  Procedure(s) Performed: Procedure(s): ENDOSCOPIC RETROGRADE CHOLANGIOPANCREATOGRAPHY (ERCP) (N/A)  Patient Location: PACU and Endoscopy Unit  Anesthesia Type:General  Level of Consciousness: awake, alert , oriented and patient cooperative  Airway & Oxygen Therapy: Patient Spontanous Breathing and Patient connected to face mask oxygen  Post-op Assessment: Report given to RN, Post -op Vital signs reviewed and stable and Patient moving all extremities  Post vital signs: Reviewed and stable  Last Vitals:  Vitals:   11/02/15 1051 11/02/15 1400  BP: (!) 169/86 (!) 153/77  Pulse: (!) 108 74  Resp:  (!) 25  Temp:  36.7 C    Last Pain:  Vitals:   11/02/15 1400  TempSrc: Oral  PainSc:          Complications: No apparent anesthesia complications

## 2015-11-02 NOTE — Care Management Important Message (Signed)
Important Message  Patient Details  Name: Amy ShanksGlenda Sheppard MRN: [id number] Date of Birth: 1941-01-26   Medicare Important Message Given:  Yes    Truth Barot, Stephan MinisterSusan Coleman 11/02/2015, 3:17 PM

## 2015-11-02 NOTE — Progress Notes (Signed)
PROCEDURE NOTE  10/30/2015 - 11/02/2015  5:50 PM  PATIENT:  Amy ShanksGlenda Sheppard  75 y.o. female  PRE-OPERATIVE DIAGNOSIS:  Obstructive jaundice stone versus tumor  POST-OPERATIVE DIAGNOSIS: Same  PROCEDURE: Failed ERCP status post balloon dilation of distal duodenum and diagnostic endoscopy procedure the side-viewing duodenum scope was inserted into the stomach and advanced into the duodenal bulb but a distal bulb stricture was seen and we were unable to advance the scope so under fluoroscopy guidance we advanced the 5.5 cm dilating balloon using 10, 11 and 12 mm balloons in the customary fashion and then we were able to advance a side-viewing scope into the second portion of the duodenum where a medium-sized diverticulum was found and fluoroscopy guidance confirm the proper position unfortunately despite probing with the sphincterotome and the wire we were unsuccessful at obtaining cannulation or even finding definitively the ampulla despite changing her position from prone to her left side as well and the side-viewing scope was removed and we proceeded with a diagnostic endoscopy which confirmed the endoscopic findings and we entered the diverticulum but still could not find the ampulla and we elected to stop the procedure at this point  SURGEON:  Surgeon(s): Vida RiggerMarc Dymond Spreen, MD  ASSESSMENT/FINDINGS:  1. Small hiatal hernia with a widely patent ring 2. Atrophic gastritis 3. 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 Duodenal diverticulum medium-sized unable to find the ampulla despite above attempts  PLAN OF CARE: Observe for delayed complications if none I've discussed her case with Dr. Loreta AveWagner from interventional radiology and will discuss that approach with the patient and her family and if this is stone disease hopefully they can obtain duodenal access and we will be able to assess wire through the catheter and proceed with sphincterotomy and stone extraction at  that point and if this is tumor hopefully they can proceed with brushings and biopsy and stenting at that time

## 2015-11-02 NOTE — Anesthesia Postprocedure Evaluation (Signed)
Anesthesia Post Note  Patient: Amy ShanksGlenda Sheppard  Procedure(s) Performed: Procedure(s) (LRB): ENDOSCOPIC RETROGRADE CHOLANGIOPANCREATOGRAPHY (ERCP) (N/A)  Patient location during evaluation: PACU Anesthesia Type: General Level of consciousness: awake and alert Pain management: pain level controlled Vital Signs Assessment: post-procedure vital signs reviewed and stable Respiratory status: spontaneous breathing, nonlabored ventilation, respiratory function stable and patient connected to nasal cannula oxygen Cardiovascular status: blood pressure returned to baseline and stable Postop Assessment: no signs of nausea or vomiting Anesthetic complications: no    Last Vitals:  Vitals:   11/02/15 1750 11/02/15 1855  BP: (!) 156/84 (!) 136/114  Pulse: 77 79  Resp: (!) 22 18  Temp:  36.3 C    Last Pain:  Vitals:   11/02/15 1723  TempSrc: Axillary  PainSc:                  Derya Dettmann L

## 2015-11-02 NOTE — Progress Notes (Signed)
PROGRESS NOTE    Amy Sheppard  NWG:[id number] DOB: Jul 29, 1940 DOA: 10/30/2015 PCP: Gildardo Griffes, MD    Brief Narrative:  Amy Sheppard a 75 y.o.femalewith medical history significant of hypertension who presents with 5 day complaint of poor oral intake. Started on Wednesday night when patient had nausea and emesis. Has felt weak since. Has had poor oral intake as well. At first she thought it was something she ate Wednesday but the problem has persisted. Given her progressive weakness she decided to come to the hospital for further evaluation recommendations. Nothing she is aware of makes it better eating makes it worse.The problem occurred insidiously.   Assessment & Plan:   Principal Problem:   Obstructive jaundice Active Problems:   Common bile duct (CBD) obstruction   Common bile duct dilatation   Transaminitis   Hypophosphatemia   Hypokalemia   Wheezing   #1 obstructive jaundice Patient had presented with nausea, vomiting noted to be jaundiced. Patient noted to have a transaminitis. CT abdomen and pelvis with an obstructing hyperdense mass versus stone in the common bile duct measuring 1.2 cm causing market CBD dilatation and intrahepatic bile duct dilatation. LFTs trending down. Patient has been assessed by GI and patient for ERCP further evaluation and management today.  #2 hypokalemia/hypophosphatemia Likely secondary to GI losses. Replete.  #3 transaminitis Likely secondary to problem #1. LFTs slowly trending down. Bilirubin still elevated. Patient for ERCP today. Per GI.  #4 wheezing Patient noted to be wheezing on examination yesterday which has since resolved with a dose of IV Solu-Medrol, scheduled nebs, Pulmicort and Brovana. Follow.  #5 leukocytosis Patient with a worsening leukocytosis with a white count of 16,000 today from 11,000. Likely secondary to IV Solu-Medrol dose which was given yesterday. Patient currently on IV Unasyn. Follow.  #6  hypertension Continue home regimen of atenolol. Start Norvasc 5 mg daily and uptitrate.   DVT prophylaxis: Heparin Code Status: Full Family Communication: Updated patient. No family at bedside. Disposition Plan: Home when workup is complete a medically stable.   Consultants:   Gastroenterology: Dr. Evette Cristal 10/31/2015  Procedures:   CT abdomen and pelvis 10/30/2015  Chest x-ray 11/01/2015  Antimicrobials:   IV Unasyn 10/31/2015   Subjective: Patient denies any nausea or emesis. Patient denies any abdominal pain. Patient anxiously awaiting ERCP.  Objective: Vitals:   11/02/15 0536 11/02/15 0616 11/02/15 0935 11/02/15 1051  BP: (!) 172/93 (!) 169/88  (!) 169/86  Pulse: 92 91  (!) 108  Resp: 19     Temp: 98.4 F (36.9 C)     TempSrc: Oral     SpO2: 96%  98%   Weight:      Height:        Intake/Output Summary (Last 24 hours) at 11/02/15 1211 Last data filed at 11/02/15 1116  Gross per 24 hour  Intake           1372.5 ml  Output             1850 ml  Net           -477.5 ml   Filed Weights   10/30/15 0927 10/30/15 1757  Weight: 77.1 kg (170 lb) 81.2 kg (179 lb)    Examination:  General exam: Appears calm and comfortable.Slightly jaundiced Respiratory system: CTAB. Respiratory effort normal. Cardiovascular system: S1 & S2 heard, RRR. No JVD, murmurs, rubs, gallops or clicks. No pedal edema. Gastrointestinal system: Abdomen is nondistended, soft and nontender. No organomegaly or masses felt. Normal bowel sounds heard.  Central nervous system: Alert and oriented. No focal neurological deficits. Extremities: Symmetric 5 x 5 power. Skin: No rashes, lesions or ulcers Psychiatry: Judgement and insight appear normal. Mood & affect appropriate.     Data Reviewed: I have personally reviewed following labs and imaging studies  CBC:  Recent Labs Lab 10/30/15 1010 10/30/15 1819 10/31/15 0516 11/01/15 1040 11/02/15 0815  WBC 9.5 8.8 8.4 11.1* 16.0*  NEUTROABS  8.0*  --   --  9.7* 14.1*  HGB 12.7 11.7* 12.4 11.1* 11.2*  HCT 37.3 35.3* 38.3 33.7* 33.3*  MCV 86.3 89.1 88.0 86.9 86.9  PLT 230 211 187 191 224   Basic Metabolic Panel:  Recent Labs Lab 10/30/15 1010 10/30/15 1819 10/31/15 0516 11/01/15 1040 11/02/15 0600  NA 132*  --  132* 135 132*  K 3.4*  --  4.0 3.1* 3.5  CL 92*  --  99* 102 100*  CO2 30  --  22 25 25   GLUCOSE 137*  --  128* 116* 149*  BUN 13  --  9 6 6   CREATININE 0.61 0.67 0.57 0.49 0.45  CALCIUM 8.8*  --  8.3* 8.0* 7.7*  MG  --  2.2  --  2.1 2.0  PHOS  --  2.0*  --  1.3* 2.0*   GFR: Estimated Creatinine Clearance: 61.8 mL/min (by C-G formula based on SCr of 0.8 mg/dL). Liver Function Tests:  Recent Labs Lab 10/30/15 1010 11/01/15 1040 11/02/15 0600  AST 185* 81* 86*  ALT 231* 142* 129*  ALKPHOS 274* 257* 257*  BILITOT 6.3* 7.4* 7.1*  PROT 7.8 5.6* 5.5*  ALBUMIN 3.4* 2.3* 2.0*    Recent Labs Lab 10/30/15 1010  LIPASE 23   No results for input(s): AMMONIA in the last 168 hours. Coagulation Profile: No results for input(s): INR, PROTIME in the last 168 hours. Cardiac Enzymes: No results for input(s): CKTOTAL, CKMB, CKMBINDEX, TROPONINI in the last 168 hours. BNP (last 3 results) No results for input(s): PROBNP in the last 8760 hours. HbA1C: No results for input(s): HGBA1C in the last 72 hours. CBG: No results for input(s): GLUCAP in the last 168 hours. Lipid Profile: No results for input(s): CHOL, HDL, LDLCALC, TRIG, CHOLHDL, LDLDIRECT in the last 72 hours. Thyroid Function Tests: No results for input(s): TSH, T4TOTAL, FREET4, T3FREE, THYROIDAB in the last 72 hours. Anemia Panel: No results for input(s): VITAMINB12, FOLATE, FERRITIN, TIBC, IRON, RETICCTPCT in the last 72 hours. Sepsis Labs: No results for input(s): PROCALCITON, LATICACIDVEN in the last 168 hours.  Recent Results (from the past 240 hour(s))  Culture, blood (routine x 2)     Status: None (Preliminary result)   Collection  Time: 10/31/15  6:40 AM  Result Value Ref Range Status   Specimen Description BLOOD LEFT HAND  Final   Special Requests IN PEDIATRIC BOTTLE 0.1CC  Final   Culture NO GROWTH 1 DAY  Final   Report Status PENDING  Incomplete  Culture, blood (routine x 2)     Status: None (Preliminary result)   Collection Time: 10/31/15  6:53 AM  Result Value Ref Range Status   Specimen Description BLOOD LEFT HAND  Final   Special Requests IN PEDIATRIC BOTTLE 0.2CC  Final   Culture NO GROWTH 1 DAY  Final   Report Status PENDING  Incomplete         Radiology Studies: Dg Chest 2 View  Result Date: 11/01/2015 CLINICAL DATA:  Patient minute 5 days ago for vomiting and weakness EXAM: CHEST  2 VIEW COMPARISON:  March 21, 2012 FINDINGS: The heart size and mediastinal contours are within normal limits. Both lungs are clear. The visualized skeletal structures are unremarkable. IMPRESSION: No active cardiopulmonary disease. Electronically Signed   By: Gerome Sam III M.D   On: 11/01/2015 14:28        Scheduled Meds: . amLODipine  5 mg Oral Daily  . ampicillin-sulbactam (UNASYN) IV  3 g Intravenous Q6H  . arformoterol  15 mcg Nebulization BID  . aspirin EC  81 mg Oral QHS  . atenolol  25 mg Oral Daily  . budesonide (PULMICORT) nebulizer solution  0.25 mg Nebulization BID  . gabapentin  300 mg Oral BID  . heparin  5,000 Units Subcutaneous Q8H  . ipratropium-albuterol  3 mL Nebulization TID  . metoCLOPramide  10 mg Oral Q6H  . pantoprazole  40 mg Oral Daily  . [START ON 11/03/2015] potassium chloride  10 mEq Oral Daily   Continuous Infusions: . dextrose 5 % and 0.9 % NaCl with KCl 20 mEq/L 75 mL/hr at 11/02/15 0656     LOS: 3 days    Time spent: 35 minutes    Earnest Thalman, MD [facility] Pager [phone]  If 7PM-7AM, please contact night-coverage [email or web address] Password [email or web address] 11/02/2015, 12:11 PM

## 2015-11-02 NOTE — Progress Notes (Signed)
Patient BP 169/88.  On-call NP, Schorr notified. Will continue to monitor patient and await NP orders.

## 2015-11-02 NOTE — Progress Notes (Signed)
Pharmacy consulted to replete phosphorus  Na 132 K 3.5 - 40 meq PO KCl given this am Phos 2  Plan: NaPhos 15 mmol IV x1 Pharmacy signing off, please re-consult if needed  Loura BackJennifer Risco, PharmD, BCPS Clinical Pharmacist Phone for today [phone]- [phone] Main pharmacy - [phone] 11/02/2015 12:48 PM

## 2015-11-02 NOTE — Op Note (Signed)
[facility] Patient Name: Amy Sheppard Procedure Date: 11/02/2015 MRN: [id number] Attending MD: Vida Rigger , MD Date of Birth: [birth year] CSN: [id number] Age: 75 Admit Type: Inpatient Procedure:                ERCP Indications:              Abnormal abdominal CT elevated liver tests Providers:                Vida Rigger, MD, Roselie Awkward, RN, Arlee Muslim                            Tech., Technician, Lauree Chandler. [place], CRNA Referring MD:              Medicines:                General Anesthesia Complications:            No immediate complications. Estimated Blood Loss:     Estimated blood loss: none. Procedure:                Pre-Anesthesia Assessment:                           - Prior to the procedure, a History and Physical                            was performed, and patient medications and                            allergies were reviewed. The patient's tolerance of                            previous anesthesia was also reviewed. The risks                            and benefits of the procedure and the sedation                            options and risks were discussed with the patient.                            All questions were answered, and informed consent                            was obtained. Prior Anticoagulants: The patient has                            taken heparin, last dose was 1 day prior to                            procedure. ASA Grade Assessment: II - A patient                            with mild systemic disease. After reviewing the  risks and benefits, the patient was deemed in                            satisfactory condition to undergo the procedure.                           After obtaining informed consent, the scope was                            passed under direct vision. Throughout the                            procedure, the patient's blood pressure, pulse, and                            oxygen  saturations were monitored continuously. The                            [id number] ([id number]) scope was introduced through                            the mouth, and used to inject contrast into without                            successful cannulation. The ERCP was technically                            difficult and complex due to abnormal anatomy.                            Successful completion of the procedure was aided by                            performing the maneuvers documented (below) in this                            report. The patient tolerated the procedure well. Scope In: Scope Out: Findings:      Dilation of Distal bulb with 10-12 Fr catheter dilator was successful. A       standard esophagogastroduodenoscopy scope was used for the examination       of the upper gastrointestinal tract. The scope was passed under direct       vision through the upper GI tract. A medium diverticulum was found in       the second portion of the duodenum. A small hiatal hernia was present. Impression:               - Small hiatal hernia.                           - Distal bulb were successfully dilated. please see                            progress note for full detailed procedure note  since provation failed twice on this dictation so I                            did a minimal job so as to not lose the data Moderate Sedation:      N/A- Per Anesthesia Care Recommendation:           - Clear liquid diet today.                           - Continue present medications.                           - Refer to an interventional radiologist today.                           - Telephone GI clinic if symptomatic PRN. Procedure Code(s):        --- Professional ---                           [id number], Esophagogastroduodenoscopy, flexible,                            transoral; diagnostic, including collection of                            specimen(s) by brushing or washing, when  performed                            (separate procedure) Diagnosis Code(s):        --- Professional ---                           K44.9, Diaphragmatic hernia without obstruction or                            gangrene                           R93.5, Abnormal findings on diagnostic imaging of                            other abdominal regions, including retroperitoneum CPT copyright 2016 [employer]. All rights reserved. The codes documented in this report are preliminary and upon coder review may  be revised to meet current compliance requirements. Vida RiggerMarc Enrika Aguado, MD 11/02/2015 6:11:37 PM This report has been signed electronically. Number of Addenda: 0

## 2015-11-02 NOTE — Anesthesia Procedure Notes (Signed)
Procedure Name: Intubation Date/Time: 11/02/2015 4:02 PM Performed by: Jarvis NewcomerARMISTEAD, Kennadee Walthour A Pre-anesthesia Checklist: Patient identified, Timeout performed, Emergency Drugs available, Patient being monitored and Suction available Patient Re-evaluated:Patient Re-evaluated prior to inductionOxygen Delivery Method: Circle system utilized Preoxygenation: Pre-oxygenation with 100% oxygen Intubation Type: IV induction Ventilation: Mask ventilation without difficulty Laryngoscope Size: Mac and 4 Grade View: Grade I Tube type: Oral Tube size: 7.0 mm Number of attempts: 1 Airway Equipment and Method: Stylet Placement Confirmation: ETT inserted through vocal cords under direct vision,  positive ETCO2 and breath sounds checked- equal and bilateral Secured at: 21 cm Tube secured with: Tape Dental Injury: Teeth and Oropharynx as per pre-operative assessment  Comments: ATOI by Daine GipSandy Allen, CRNA

## 2015-11-03 ENCOUNTER — Encounter (HOSPITAL_COMMUNITY)
Admission: EM | Disposition: A | Discharge status: Home Health | Payer: Self-pay | Source: Home / Self Care | Attending: Internal Medicine

## 2015-11-03 ENCOUNTER — Encounter (HOSPITAL_COMMUNITY): Payer: Self-pay | Admitting: Gastroenterology

## 2015-11-03 ENCOUNTER — Inpatient Hospital Stay (HOSPITAL_COMMUNITY): Payer: Medicare Other

## 2015-11-03 DIAGNOSIS — E860 Dehydration: Secondary | ICD-10-CM

## 2015-11-03 HISTORY — PX: IR GENERIC HISTORICAL: IMG1180011

## 2015-11-03 LAB — PROTIME-INR
INR: 1.1
Prothrombin Time: 14.2 seconds (ref 11.4–15.2)

## 2015-11-03 LAB — APTT: APTT: 26 s (ref 24–36)

## 2015-11-03 LAB — GRAM STAIN

## 2015-11-03 SURGERY — ERCP, WITH INTERVENTION IF INDICATED
Anesthesia: General

## 2015-11-03 MED ORDER — SODIUM PHOSPHATES 45 MMOLE/15ML IV SOLN
15.0000 mmol | Freq: Once | INTRAVENOUS | Status: AC
  Administered 2015-11-03: 15 mmol via INTRAVENOUS
  Filled 2015-11-03: qty 5

## 2015-11-03 MED ORDER — FENTANYL CITRATE (PF) 100 MCG/2ML IJ SOLN
INTRAMUSCULAR | Status: AC
  Filled 2015-11-03: qty 4

## 2015-11-03 MED ORDER — IOPAMIDOL (ISOVUE-300) INJECTION 61%
INTRAVENOUS | Status: AC
  Administered 2015-11-03: 25 mL
  Filled 2015-11-03: qty 50

## 2015-11-03 MED ORDER — SODIUM CHLORIDE 0.9 % IV SOLN
INTRAVENOUS | Status: AC | PRN
  Administered 2015-11-03: 10 mL/h via INTRAVENOUS

## 2015-11-03 MED ORDER — IPRATROPIUM-ALBUTEROL 0.5-2.5 (3) MG/3ML IN SOLN: 3.0000 mL | RESPIRATORY_TRACT | Status: DC | PRN

## 2015-11-03 MED ORDER — LIDOCAINE HCL 1 % IJ SOLN
INTRAMUSCULAR | Status: AC | PRN
  Administered 2015-11-03: 10 mL

## 2015-11-03 MED ORDER — LIDOCAINE HCL 1 % IJ SOLN
INTRAMUSCULAR | Status: AC
  Filled 2015-11-03: qty 20

## 2015-11-03 MED ORDER — MIDAZOLAM HCL 2 MG/2ML IJ SOLN
INTRAMUSCULAR | Status: AC | PRN
  Administered 2015-11-03: 0.5 mg via INTRAVENOUS
  Administered 2015-11-03: 1 mg via INTRAVENOUS
  Administered 2015-11-03: 0.5 mg via INTRAVENOUS

## 2015-11-03 MED ORDER — FENTANYL CITRATE (PF) 100 MCG/2ML IJ SOLN
INTRAMUSCULAR | Status: AC | PRN
  Administered 2015-11-03 (×3): 25 ug via INTRAVENOUS

## 2015-11-03 MED ORDER — MIDAZOLAM HCL 2 MG/2ML IJ SOLN
INTRAMUSCULAR | Status: AC
  Filled 2015-11-03: qty 4

## 2015-11-03 NOTE — Progress Notes (Signed)
PROGRESS NOTE    Amy ShanksGlenda Sheppard  ZOX:[id number] DOB: 01-10-1941 DOA: 10/30/2015 PCP: Gildardo GriffesBURNS,KEVIN, MD    Brief Narrative:  Amy AsalGlenda Sheppard a 75 y.o.femalewith medical history significant of hypertension who presents with 5 day complaint of poor oral intake. Started on Wednesday night when patient had nausea and emesis. Has felt weak since. Has had poor oral intake as well. At first she thought it was something she ate Wednesday but the problem has persisted. Given her progressive weakness she decided to come to the hospital for further evaluation recommendations. Nothing she is aware of makes it better eating makes it worse.The problem occurred insidiously.   Assessment & Plan:   Principal Problem:   Obstructive jaundice Active Problems:   Common bile duct (CBD) obstruction   Common bile duct dilatation   Transaminitis   Hypophosphatemia   Hypokalemia   Wheezing   Essential hypertension   #1 obstructive jaundice secondary to common bile duct stones Patient had presented with nausea, vomiting noted to be jaundiced. Patient noted to have a transaminitis. CT abdomen and pelvis with an obstructing hyperdense mass versus stone in the common bile duct measuring 1.2 cm causing market CBD dilatation and intrahepatic bile duct dilatation. LFTs trending down. Patient has been assessed by GI and status post failed ERCP with balloon dilatation of distal duodenum and diagnostic endoscopic procedure 11/02/2015. Patient was seen in consultation by interventional radiology and subsequently underwent percutaneous transhepatic cholangiogram with left internal/external biliary drain and fluid sent for culture and cytology. Findings on PT cholangiogram did show severe biliary dilatation with multiple large filling defects in the, bile duct consistent with stones. Place patient on clear liquids and advance slowly. IR and GI following. Labs pending for today. CT abdomen and pelvis on admission did show  gallbladder distention containing small amount of layering stones and/or sludge. GI and IR following and appreciate input and recommendations. ?? Elective cholecystectomy once patient over acute illness. Continue IV Unasyn.   #2 hypokalemia/hypophosphatemia Likely secondary to GI losses. Replete. Labs pending.  #3 transaminitis Likely secondary to problem #1. LFTs slowly trending down. Bilirubin still elevated. Labs pending for today. Patient s/p failed ERCP 11/02/2015 with balloon dilatation of distal duodenum and diagnostic endoscopy procedure. Patient underwent successful percutaneous transhepatic cholangiogram with internal/external biliary drain placement. Interventional radiology. Monitor LFTs closely. Continue empiric IV antibiotics. GI and IR following and appreciate input and recommendations.   #4 wheezing Patient noted to be wheezing on examination 11/01/15, which has since resolved with a dose of IV Solu-Medrol, scheduled nebs, Pulmicort and Brovana. Follow.  #5 leukocytosis Patient with a worsening leukocytosis with a white count of 16,000 (11/02/2015) from 11,000. Labs pending. Likely secondary to IV Solu-Medrol dose which was given on 11/01/2015. Patient currently on IV Unasyn. Follow.  #6 hypertension Continue home regimen of atenolol and Norvasc 5 mg daily and uptitrate.   DVT prophylaxis: Heparin Code Status: Full Family Communication: Updated patient. No family at bedside. Disposition Plan: Home when workup is complete a medically stable.   Consultants:   Gastroenterology: Dr. Evette CristalGanem 10/31/2015  Interventional radiology: Dr. Lowella DandyHenn 11/03/2015  Procedures:   CT abdomen and pelvis 10/30/2015  Chest x-ray 11/01/2015  Unsuccessful ERCP 11/02/2015 per Dr.Magod  Percutaneous chance hepatic cholangiogram per Dr. Lowella DandyHenn 05/06/2015  Antimicrobials:   IV Unasyn 10/31/2015   Subjective: Patient just returning from percutaneous transhepatic cholangiogram from left hepatic  approach with drain placement. Patient is drowsy however opens eyes to verbal stimuli. Patient denies any chest pain. No shortness of breath.  No nausea. No vomiting.  Objective: Vitals:   11/03/15 1425 11/03/15 1430 11/03/15 1435 11/03/15 1440  BP: (!) 147/76 (!) 153/91 (!) 147/104 139/83  Pulse: 86 87 80 81  Resp: (!) 21 (!) 22 (!) 24 (!) 28  Temp:      TempSrc:      SpO2: 99% 99% 98% 96%  Weight:      Height:        Intake/Output Summary (Last 24 hours) at 11/03/15 1634 Last data filed at 11/03/15 1208  Gross per 24 hour  Intake           1517.5 ml  Output             1700 ml  Net           -182.5 ml   Filed Weights   10/30/15 0927 10/30/15 1757 11/02/15 1400  Weight: 77.1 kg (170 lb) 81.2 kg (179 lb) 81.2 kg (179 lb)    Examination:  General exam: Appears calm and comfortable.Slightly jaundiced. Respiratory system: CTAB anterior lung fields. Respiratory effort normal. Cardiovascular system: S1 & S2 heard, RRR. No JVD, murmurs, rubs, gallops or clicks. No pedal edema. Gastrointestinal system: Abdomen is nondistended, soft and nontender. No organomegaly or masses felt. Normal bowel sounds heard. Drain with biliary drainage noted. Central nervous system: Alert and oriented. No focal neurological deficits. Extremities: Symmetric 5 x 5 power. Skin: No rashes, lesions or ulcers Psychiatry: Judgement and insight appear normal. Mood & affect appropriate.     Data Reviewed: I have personally reviewed following labs and imaging studies  CBC:  Recent Labs Lab 10/30/15 1010 10/30/15 1819 10/31/15 0516 11/01/15 1040 11/02/15 0815  WBC 9.5 8.8 8.4 11.1* 16.0*  NEUTROABS 8.0*  --   --  9.7* 14.1*  HGB 12.7 11.7* 12.4 11.1* 11.2*  HCT 37.3 35.3* 38.3 33.7* 33.3*  MCV 86.3 89.1 88.0 86.9 86.9  PLT 230 211 187 191 224   Basic Metabolic Panel:  Recent Labs Lab 10/30/15 1010 10/30/15 1819 10/31/15 0516 11/01/15 1040 11/02/15 0600  NA 132*  --  132* 135 132*  K 3.4*   --  4.0 3.1* 3.5  CL 92*  --  99* 102 100*  CO2 30  --  GLUCOSE 137*  --  128* 116* 149*  BUN 13  --  CREATININE 0.61 0.67 0.57 0.49 0.45  CALCIUM 8.8*  --  8.3* 8.0* 7.7*  MG  --  2.2  --  2.1 2.0  PHOS  --  2.0*  --  1.3* 2.0*   GFR: Estimated Creatinine Clearance: 61.8 mL/min (by C-G formula based on SCr of 0.8 mg/dL). Liver Function Tests:  Recent Labs Lab 10/30/15 1010 11/01/15 1040 11/02/15 0600  AST 185* 81* 86*  ALT 231* 142* 129*  ALKPHOS 274* 257* 257*  BILITOT 6.3* 7.4* 7.1*  PROT 7.8 5.6* 5.5*  ALBUMIN 3.4* 2.3* 2.0*    Recent Labs Lab 10/30/15 1010  LIPASE 23   No results for input(s): AMMONIA in the last 168 hours. Coagulation Profile:  Recent Labs Lab 11/03/15 0751  INR 1.10   Cardiac Enzymes: No results for input(s): CKTOTAL, CKMB, CKMBINDEX, TROPONINI in the last 168 hours. BNP (last 3 results) No results for input(s): PROBNP in the last 8760 hours. HbA1C: No results for input(s): HGBA1C in the last 72 hours. CBG: No results for input(s): GLUCAP in the last 168 hours. Lipid Profile: No results for input(s): CHOL, HDL,  LDLCALC, TRIG, CHOLHDL, LDLDIRECT in the last 72 hours. Thyroid Function Tests: No results for input(s): TSH, T4TOTAL, FREET4, T3FREE, THYROIDAB in the last 72 hours. Anemia Panel: No results for input(s): VITAMINB12, FOLATE, FERRITIN, TIBC, IRON, RETICCTPCT in the last 72 hours. Sepsis Labs: No results for input(s): PROCALCITON, LATICACIDVEN in the last 168 hours.  Recent Results (from the past 240 hour(s))  Culture, blood (routine x 2)     Status: None (Preliminary result)   Collection Time: 10/31/15  6:40 AM  Result Value Ref Range Status   Specimen Description BLOOD LEFT HAND  Final   Special Requests IN PEDIATRIC BOTTLE 0.1CC  Final   Culture NO GROWTH 3 DAYS  Final   Report Status PENDING  Incomplete  Culture, blood (routine x 2)     Status: None (Preliminary result)   Collection Time: 10/31/15   6:53 AM  Result Value Ref Range Status   Specimen Description BLOOD LEFT HAND  Final   Special Requests IN PEDIATRIC BOTTLE 0.2CC  Final   Culture NO GROWTH 3 DAYS  Final   Report Status PENDING  Incomplete  Culture, body fluid-bottle     Status: None (Preliminary result)   Collection Time: 11/03/15  2:55 PM  Result Value Ref Range Status   Specimen Description FLUID BILE  Final   Special Requests BOTTLES DRAWN AEROBIC AND ANAEROBIC 10CC  Final   Culture NO GROWTH <12 HOURS  Final   Report Status PENDING  Incomplete  Gram stain     Status: None   Collection Time: 11/03/15  2:55 PM  Result Value Ref Range Status   Specimen Description FLUID BILE  Final   Special Requests NONE  Final   Gram Stain   Final    FEW WBC PRESENT, PREDOMINANTLY PMN MODERATE GRAM NEGATIVE RODS FEW GRAM POSITIVE RODS    Report Status 11/03/2015 FINAL  Final         Radiology Studies: Dg C-arm 61-120 Min-no Report  Result Date: 11/02/2015 CLINICAL DATA: obstructive jaundice C-ARM 61-120 MINUTES Fluoroscopy was utilized by the requesting physician.  No radiographic interpretation.        Scheduled Meds: . amLODipine  5 mg Oral Daily  . ampicillin-sulbactam (UNASYN) IV  3 g Intravenous Q6H  . arformoterol  15 mcg Nebulization BID  . aspirin EC  81 mg Oral QHS  . atenolol  25 mg Oral Daily  . budesonide (PULMICORT) nebulizer solution  0.25 mg Nebulization BID  . fentaNYL      . gabapentin  300 mg Oral BID  . lidocaine      . metoCLOPramide  10 mg Oral Q6H  . midazolam      . pantoprazole  40 mg Oral Daily  . potassium chloride  10 mEq Oral Daily  . sodium phosphate  Dextrose 5% IVPB  15 mmol Intravenous Once  . sodium phosphate  Dextrose 5% IVPB  15 mmol Intravenous Once   Continuous Infusions: . dextrose 5 % and 0.9 % NaCl with KCl 20 mEq/L 75 mL/hr at 11/03/15 1522     LOS: 4 days    Time spent: 35 minutes    Shelli Portilla, MD [facility] Pager [phone]  If  7PM-7AM, please contact night-coverage [email or web address] Password [facility] 11/03/2015, 4:34 PM

## 2015-11-03 NOTE — Progress Notes (Signed)
Went to IR to see patient. She is having her PTC.

## 2015-11-03 NOTE — Progress Notes (Signed)
Chief Complaint: Patient was seen in consultation today for obstructive jaundice at the request of Dr. Clarene Essex  Referring Physician(s): Dr. Clarene Essex  Supervising Physician: Markus Daft  Patient Status: Inpatient  History of Present Illness: Amy Sheppard is a 75 y.o. female with obstructive jaundice, possible from stone v tumor. She underwent ERCP, but unsuccessful attempt to access the CBD. Her TBili continues to be elevtaed and her WBC is also rising. IR is requested to perform PTC, with drainage and possible biopsy if indicated. Chart, meds, labs, imaging reviewed. She feels ok this am, No N/V, some vague abd pain. Has been NPO No family in room at this time.  Past Medical History:  Diagnosis Date  . GERD (gastroesophageal reflux disease)   . High cholesterol   . Hypertension   . Lymphocytic colitis   . Migraine    "never anymore; it's been awhile since I've had one" (11/01/2015)  . UTI (lower urinary tract infection) X 1    Past Surgical History:  Procedure Laterality Date  . ESOPHAGOGASTRODUODENOSCOPY N/A 11/02/2015   Procedure: ESOPHAGOGASTRODUODENOSCOPY (EGD);  Surgeon: Clarene Essex, MD;  Location: [facility];  Service: Endoscopy;  Laterality: N/A;  . TUBAL LIGATION  1970s    Allergies: Tetanus toxoids  Medications:  Current Facility-Administered Medications:  .  acetaminophen (TYLENOL) tablet 650 mg, 650 mg, Oral, Q4H PRN, Debbe Odea, MD, 650 mg at 10/31/15 1405 .  amLODipine (NORVASC) tablet 5 mg, 5 mg, Oral, Daily, Eugenie Filler, MD, 5 mg at 11/02/15 1053 .  Ampicillin-Sulbactam (UNASYN) 3 g in sodium chloride 0.9 % 100 mL IVPB, 3 g, Intravenous, Q6H, Donalynn Furlong [place], RPH, 3 g at 11/03/15 0215 .  arformoterol (BROVANA) nebulizer solution 15 mcg, 15 mcg, Nebulization, BID, Eugenie Filler, MD, 15 mcg at 11/03/15 [phone] .  aspirin EC tablet 81 mg, 81 mg, Oral, QHS, Eugenie Filler, MD, 81 mg at 11/02/15 2204 .  atenolol (TENORMIN) tablet 25  mg, 25 mg, Oral, Daily, Eugenie Filler, MD, 25 mg at 11/02/15 1051 .  budesonide (PULMICORT) nebulizer solution 0.25 mg, 0.25 mg, Nebulization, BID, Eugenie Filler, MD, 0.25 mg at 11/03/15 0843 .  dextrose 5 % and 0.9 % NaCl with KCl 20 mEq/L infusion, , Intravenous, Continuous, Eugenie Filler, MD, Last Rate: 75 mL/hr at 11/03/15 0058 .  gabapentin (NEURONTIN) capsule 300 mg, 300 mg, Oral, BID, Eugenie Filler, MD, 300 mg at 11/02/15 2204 .  hydrALAZINE (APRESOLINE) injection 10 mg, 10 mg, Intravenous, Q6H PRN, Eugenie Filler, MD .  HYDROcodone-acetaminophen (NORCO/VICODIN) 5-325 MG per tablet 1-2 tablet, 1-2 tablet, Oral, Q4H PRN, Velvet Bathe, MD, 1 tablet at 10/30/15 1927 .  ibuprofen (ADVIL,MOTRIN) tablet 400 mg, 400 mg, Oral, Q4H PRN, Debbe Odea, MD .  ipratropium-albuterol (DUONEB) 0.5-2.5 (3) MG/3ML nebulizer solution 3 mL, 3 mL, Nebulization, Q2H PRN, Eugenie Filler, MD .  ipratropium-albuterol (DUONEB) 0.5-2.5 (3) MG/3ML nebulizer solution 3 mL, 3 mL, Nebulization, TID, Eugenie Filler, MD, 3 mL at 11/03/15 0843 .  metoCLOPramide (REGLAN) tablet 10 mg, 10 mg, Oral, Q6H, Velvet Bathe, MD, 10 mg at 11/03/15 0526 .  ondansetron (ZOFRAN) tablet 4 mg, 4 mg, Oral, Q6H PRN **OR** ondansetron (ZOFRAN) injection 4 mg, 4 mg, Intravenous, Q6H PRN, Velvet Bathe, MD .  pantoprazole (PROTONIX) EC tablet 40 mg, 40 mg, Oral, Daily, Eugenie Filler, MD, 40 mg at 11/02/15 1053 .  potassium chloride (K-DUR,KLOR-CON) CR tablet 10 mEq, 10 mEq, Oral, Daily, Malachy Moan  [place] Silos, MD .  sodium phosphate 15 mmol in dextrose 5 % 250 mL infusion, 15 mmol, Intravenous, Once, Donalynn Furlong [place], [facility]    History reviewed. No pertinent family history.  Social History   Social History  . Marital status: Widowed    Spouse name: N/A  . Number of children: N/A  . Years of education: N/A   Social History Main Topics  . Smoking status: Former Smoker    Packs/day: 1.00    Years: 30.00    Types:  Cigarettes  . Smokeless tobacco: Never Used  . Alcohol use No  . Drug use: No  . Sexual activity: No   Other Topics Concern  . None   Social History Narrative  . None     Review of Systems: A 12 point ROS discussed and pertinent positives are indicated in the HPI above.  All other systems are negative.  Review of Systems  Constitutional: Positive for appetite change and fatigue.  Respiratory: Negative.   Cardiovascular: Negative.  Negative for chest pain, palpitations and leg swelling.  Gastrointestinal: Positive for abdominal pain. Negative for blood in stool, diarrhea, nausea and vomiting.  Genitourinary: Negative.   Skin: Positive for color change.       jaundice  Neurological: Negative.   Psychiatric/Behavioral: Negative.     Vital Signs: BP 118/62 (BP Location: Right Arm)   Pulse 89   Temp 98.5 F (36.9 C) (Oral)   Resp 18   Ht '5\' 2"'$  (1.575 m)   Wt 179 lb (81.2 kg)   SpO2 96%   BMI 32.74 kg/m   Physical Exam  Constitutional: She is oriented to person, place, and time. She appears well-developed. No distress.  HENT:  Head: Normocephalic.  Mouth/Throat: Oropharynx is clear and moist.  Neck: Normal range of motion. No JVD present.  Cardiovascular: Normal rate, regular rhythm and normal heart sounds.   Pulmonary/Chest: Effort normal and breath sounds normal. No respiratory distress. She has no wheezes.  Abdominal: Soft. She exhibits no distension and no mass. There is tenderness. There is no guarding.  Neurological: She is alert and oriented to person, place, and time.  Skin: Skin is warm and dry.  Jaundiced  Psychiatric: She has a normal mood and affect. Judgment normal.    Mallampati Score:  MD Evaluation Airway: WNL Heart: WNL Abdomen: WNL Chest/ Lungs: WNL ASA  Classification: 2 Mallampati/Airway Score: Two  Imaging: Dg Chest 2 View  Result Date: 11/01/2015 CLINICAL DATA:  Patient minute 5 days ago for vomiting and weakness EXAM: CHEST  2 VIEW  COMPARISON:  March 21, 2012 FINDINGS: The heart size and mediastinal contours are within normal limits. Both lungs are clear. The visualized skeletal structures are unremarkable. IMPRESSION: No active cardiopulmonary disease. Electronically Signed   By: Dorise Bullion III M.D   On: 11/01/2015 14:28   Ct Abdomen Pelvis W Contrast  Result Date: 10/30/2015 CLINICAL DATA:  Persistent vomiting for 8-10 hours 4 days ago. Now weekend dehydrated. Elevated liver function test. EXAM: CT ABDOMEN AND PELVIS WITH CONTRAST TECHNIQUE: Multidetector CT imaging of the abdomen and pelvis was performed using the standard protocol following bolus administration of intravenous contrast. CONTRAST:  173m ISOVUE-300 IOPAMIDOL (ISOVUE-300) INJECTION 61% COMPARISON:  None. FINDINGS: Lower chest:  No acute findings. Hepatobiliary: Obstructing mass versus stone in the common bile duct, measuring approximately 1.2 cm, causing marked common bile duct and intrahepatic bile duct dilatation. Gallbladder is moderately distended. Layering stones versus sludge in the gallbladder. No pericholecystic inflammation seen. Pancreas:  No pancreatic duct dilatation. No peripancreatic fluid or inflammation. Spleen: Within normal limits in size and appearance. Adrenals/Urinary Tract: Hyperdense mass exophytic to the posterior cortex of the left kidney measures 1.7 x 1.5 cm. No left-sided renal stone or hydronephrosis. Right kidney appears normal without mass, stone or hydronephrosis. No ureteral or bladder calculi identified. Bladder appears normal. Stomach/Bowel: Bowel is normal in caliber. Scattered diverticulosis throughout the colon without evidence of acute diverticulitis. No evidence of bowel wall inflammation. Stomach appears normal. Appendix is normal. Vascular/Lymphatic: Atherosclerotic changes of the normal caliber abdominal aorta. Mildly prominent lymph nodes within the upper abdomen. Reproductive: Cystic mass within the left adnexa,  presumably ovarian, measuring 3.5 x 3.5 cm. Right adnexal region is unremarkable. Other: No free fluid or abscess collections seen. No free intraperitoneal air. Musculoskeletal: Degenerative changes of the thoracolumbar spine, moderate in degree. No acute or suspicious osseous finding. Superficial soft tissues are unremarkable. IMPRESSION: 1. Obstructing hyperdense mass versus stone in the common bile duct, measuring approximately 1.2 cm greatest dimension, causing marked CBD dilatation and intrahepatic bile duct dilatation. No pancreatic duct dilatation, therefore, obstructing stone versus mass is above the level of the pancreatic duct. Recommend GI consultation for possible ERCP. 2. Associated gallbladder distension, containing a small amount of layering stones and/or sludge. No pericholecystic fluid. 3. Mildly prominent lymph nodes within the upper abdomen, largest in the porta hepatis and portacaval regions. These could be reactive or neoplastic in nature, depending on the cause for the CBD obstruction. 4. Cystic mass in the left adnexa, measuring 3.5 x 3.5 cm. Given patient's age, recommend pelvic ultrasound after current issues are resolved to exclude cystic neoplasm of the left ovary. 5. Hyperdense mass exophytic to the posterior cortex of the left kidney, measuring 1.7 x 1.5 cm. Solid neoplastic mass cannot be excluded based on this single exam. Recommend renal MRI for further characterization after current issues are resolved. 6. Colonic diverticulosis without evidence of acute diverticulitis. 7. Aortic atherosclerosis. These results and recommendations were called by telephone at the time of interpretation on 10/30/2015 at 12:33 pm to Dr. Threasa Beards BELFI , who verbally acknowledged these results. Electronically Signed   By: Franki Cabot M.D.   On: 10/30/2015 12:35   Dg C-arm 61-120 Min-no Report  Result Date: 11/02/2015 CLINICAL DATA: obstructive jaundice C-ARM 61-120 MINUTES Fluoroscopy was utilized by  the requesting physician.  No radiographic interpretation.    Labs:  CBC:  Recent Labs  10/30/15 1819 10/31/15 0516 11/01/15 1040 11/02/15 0815  WBC 8.8 8.4 11.1* 16.0*  HGB 11.7* 12.4 11.1* 11.2*  HCT 35.3* 38.3 33.7* 33.3*  PLT 211 187 191 224    COAGS:  Recent Labs  11/03/15 0751  INR 1.10  APTT 26    BMP:  Recent Labs  10/30/15 1010 10/30/15 1819 10/31/15 0516 11/01/15 1040 11/02/15 0600  NA 132*  --  132* 135 132*  K 3.4*  --  4.0 3.1* 3.5  CL 92*  --  99* 102 100*  CO2 30  --  '22 25 25  '$ GLUCOSE 137*  --  128* 116* 149*  BUN 13  --  '9 6 6  '$ CALCIUM 8.8*  --  8.3* 8.0* 7.7*  CREATININE 0.61 0.67 0.57 0.49 0.45  GFRNONAA >60 >60 >60 >60 >60  GFRAA >60 >60 >60 >60 >60    LIVER FUNCTION TESTS:  Recent Labs  10/30/15 1010 11/01/15 1040 11/02/15 0600  BILITOT 6.3* 7.4* 7.1*  AST 185* 81* 86*  ALT 231* 142* 129*  ALKPHOS 274* 257* 257*  PROT 7.8 5.6* 5.5*  ALBUMIN 3.4* 2.3* 2.0*    TUMOR MARKERS: No results for input(s): AFPTM, CEA, CA199, CHROMGRNA in the last 8760 hours.  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 Labs reviewed. Risks and Benefits discussed with the patient including, but not limited to bleeding, infection which may lead to sepsis or even death and damage to adjacent structures. All of the patient's questions were answered, patient is agreeable to proceed. Consent signed and in chart.   Thank you for this interesting consult.    A copy of this report was sent to the requesting provider on this date.  Electronically Signed: Ascencion Dike 11/03/2015, 9:53 AM   I spent a total of 20 minutes in face to face in clinical consultation, greater than 50% of which was counseling/coordinating care for obstructive jaundice requiring PTC.

## 2015-11-03 NOTE — Procedures (Signed)
Post-Procedure Note  Pre-operative Diagnosis: Obstructive jaundice       Post-operative Diagnosis: Obstructive jaundice due to CBD stones   Indications: Obstructive stones  Procedure Details:   Successful PTC from left hepatic approach.  Successful placement of left int/ext biliary drain.    Findings: Severe biliary dilatation.  Multiple large filling defects in CBD, c/w stones.  Drain advanced into duodenum.  Complications: None     Condition: Stable  Plan: Follow output and labs.  Send fluid for culture and cytology.

## 2015-11-03 NOTE — Progress Notes (Signed)
Pharmacy Antibiotic Note  Amy Sheppard is a 74 y.o. female admitted on 10/30/2015 with nausea and poor oral intake.  Pharmacy has been consulted for Unasyn dosing for intra-abdominal infection. CT scan reveals stone vs mass in the CBD. S/p PTC w/ biliary drainage 8/10, possibly to have biopsy. Afeb, wbc up 16. CrCl~61.  Plan: Unasyn 3 g IV q6h  Monitor renal function, c/s, clinical progress, LOT   Height: 5' 2" (157.5 cm) Weight: 179 lb (81.2 kg) IBW/kg (Calculated) : 50.1  Temp (24hrs), Avg:97.8 F (36.6 C), Min:96.8 F (36 C), Max:98.5 F (36.9 C)   Recent Labs Lab 10/30/15 1010 10/30/15 1819 10/31/15 0516 11/01/15 1040 11/02/15 0600 11/02/15 0815  WBC 9.5 8.8 8.4 11.1*  --  16.0*  CREATININE 0.61 0.67 0.57 0.49 0.45  --     Estimated Creatinine Clearance: 61.8 mL/min (by C-G formula based on SCr of 0.8 mg/dL).    Allergies  Allergen Reactions  . Tetanus Toxoids Swelling    WELTS    Antimicrobials this admission: Unasyn 8/7 >>    Dose adjustments this admission:   Microbiology results: 8/7 BCx: ngtd   Thank you for allowing pharmacy to be a part of this patient's care.  Haley Baird, PharmD, BCPS Clinical Pharmacist Pager [phone] 11/03/2015 1:57 PM   

## 2015-11-04 DIAGNOSIS — R0602 Shortness of breath: Secondary | ICD-10-CM

## 2015-11-04 DIAGNOSIS — K8051 Calculus of bile duct without cholangitis or cholecystitis with obstruction: Secondary | ICD-10-CM

## 2015-11-04 LAB — BASIC METABOLIC PANEL
ANION GAP: 6 (ref 5–15)
BUN: 11 mg/dL (ref 6–20)
CHLORIDE: 102 mmol/L (ref 101–111)
CO2: 25 mmol/L (ref 22–32)
Calcium: 7.9 mg/dL — ABNORMAL LOW (ref 8.9–10.3)
Creatinine, Ser: 0.59 mg/dL (ref 0.44–1.00)
Glucose, Bld: 96 mg/dL (ref 65–99)
POTASSIUM: 3.6 mmol/L (ref 3.5–5.1)
SODIUM: 133 mmol/L — AB (ref 135–145)

## 2015-11-04 LAB — RETICULOCYTES
RBC.: 3.17 MIL/uL — ABNORMAL LOW (ref 3.87–5.11)
Retic Count, Absolute: 41.2 10*3/uL (ref 19.0–186.0)
Retic Ct Pct: 1.3 % (ref 0.4–3.1)

## 2015-11-04 LAB — FOLATE: FOLATE: 24.6 ng/mL (ref >5.9)

## 2015-11-04 LAB — HEPATIC FUNCTION PANEL
ALT: 77 U/L — AB (ref 14–54)
AST: 30 U/L (ref 15–41)
Albumin: 1.7 g/dL — ABNORMAL LOW (ref 3.5–5.0)
Alkaline Phosphatase: 160 U/L — ABNORMAL HIGH (ref 38–126)
BILIRUBIN DIRECT: 2.8 mg/dL — AB (ref 0.1–0.5)
Indirect Bilirubin: 2.1 mg/dL — ABNORMAL HIGH (ref 0.3–0.9)
Total Bilirubin: 4.9 mg/dL — ABNORMAL HIGH (ref 0.3–1.2)
Total Protein: 4.4 g/dL — ABNORMAL LOW (ref 6.5–8.1)

## 2015-11-04 LAB — CBC WITH DIFFERENTIAL/PLATELET
BASOS PCT: 0 %
Basophils Absolute: 0 10*3/uL (ref 0.0–0.1)
Eosinophils Absolute: 0.1 10*3/uL (ref 0.0–0.7)
Eosinophils Relative: 1 %
HEMATOCRIT: 26.2 % — AB (ref 36.0–46.0)
HEMOGLOBIN: 8.7 g/dL — AB (ref 12.0–15.0)
LYMPHS ABS: 1 10*3/uL (ref 0.7–4.0)
Lymphocytes Relative: 10 %
MCH: 29.1 pg (ref 26.0–34.0)
MCHC: 33.2 g/dL (ref 30.0–36.0)
MCV: 87.6 fL (ref 78.0–100.0)
MONOS PCT: 6 %
Monocytes Absolute: 0.6 10*3/uL (ref 0.1–1.0)
NEUTROS ABS: 8.1 10*3/uL — AB (ref 1.7–7.7)
NEUTROS PCT: 83 %
Platelets: 222 10*3/uL (ref 150–400)
RBC: 2.99 MIL/uL — AB (ref 3.87–5.11)
RDW: 14.7 % (ref 11.5–15.5)
WBC: 9.8 10*3/uL (ref 4.0–10.5)

## 2015-11-04 LAB — MAGNESIUM: MAGNESIUM: 2 mg/dL (ref 1.7–2.4)

## 2015-11-04 LAB — VITAMIN B12: VITAMIN B 12: 784 pg/mL (ref 180–914)

## 2015-11-04 LAB — FERRITIN: Ferritin: 186 ng/mL (ref 11–307)

## 2015-11-04 LAB — IRON AND TIBC
IRON: 59 ug/dL (ref 28–170)
SATURATION RATIOS: 29 % (ref 10.4–31.8)
TIBC: 202 ug/dL — AB (ref 250–450)
UIBC: 143 ug/dL

## 2015-11-04 MED ORDER — CHOLESTYRAMINE 4 G PO PACK
4.0000 g | PACK | Freq: Two times a day (BID) | ORAL | Status: DC
  Administered 2015-11-04 – 2015-11-10 (×12): 4 g via ORAL
  Filled 2015-11-04 (×15): qty 1

## 2015-11-04 NOTE — Progress Notes (Signed)
PROGRESS NOTE    Amy Sheppard  ZOX:[id number] DOB: December 21, 1940 DOA: 10/30/2015 PCP: Gildardo Griffes, MD    Brief Narrative:  Amy Sheppard a 75 y.o.femalewith medical history significant of hypertension who presents with 5 day complaint of poor oral intake. Started on Wednesday night when patient had nausea and emesis. Has felt weak since. Has had poor oral intake as well. At first she thought it was something she ate Wednesday but the problem has persisted. Given her progressive weakness she decided to come to the hospital for further evaluation recommendations. Nothing she is aware of makes it better eating makes it worse.The problem occurred insidiously.   Assessment & Plan:   Principal Problem:   Obstructive jaundice Active Problems:   Common bile duct (CBD) obstruction   Common bile duct dilatation   Transaminitis   Hypophosphatemia   Hypokalemia   Wheezing   Essential hypertension   Dehydration   #1 obstructive jaundice secondary to common bile duct stones/choledocholithiasis Patient had presented with nausea, vomiting noted to be jaundiced. Patient noted to have a transaminitis. CT abdomen and pelvis with an obstructing hyperdense mass versus stone in the common bile duct measuring 1.2 cm causing market CBD dilatation and intrahepatic bile duct dilatation. LFTs trending down. Patient has been assessed by GI and status post failed ERCP with balloon dilatation of distal duodenum and diagnostic endoscopic procedure 11/02/2015. Patient was seen in consultation by interventional radiology and subsequently underwent percutaneous transhepatic cholangiogram with left internal/external biliary drain and fluid sent for culture and cytology. Findings on PT cholangiogram did show severe biliary dilatation with multiple large filling defects in the, bile duct consistent with stones. Place patient on clear liquids and advance slowly. IR and GI following. CT abdomen and pelvis on admission did  show gallbladder distention containing small amount of layering stones and/or sludge. GI and IR following and appreciate input and recommendations. Patient for repeat ERCP at early next week per GI.?? Elective cholecystectomy once patient over acute illness. Continue IV Unasyn.   #2 hypokalemia/hypophosphatemia Likely secondary to GI losses. Replete.   #3 transaminitis Likely secondary to problem #1. LFTs slowly trending down. Bilirubin trending back down. Patient s/p failed ERCP 11/02/2015 with balloon dilatation of distal duodenum and diagnostic endoscopy procedure. Patient underwent successful percutaneous transhepatic cholangiogram with internal/external biliary drain placement. Interventional radiology. Monitor LFTs closely. Continue empiric IV antibiotics. GI and IR following and appreciate input and recommendations.   #4 wheezing Patient noted to be wheezing on examination 11/01/15, which has since resolved with a dose of IV Solu-Medrol, scheduled nebs, Pulmicort and Brovana. Follow.  #5 leukocytosis Patient with a worsening leukocytosis with a white count of 16,000 (11/02/2015) from 11,000. Labs pending. Likely secondary to IV Solu-Medrol dose which was given on 11/01/2015. WBC improved. Patient currently on IV Unasyn. Follow.  #6 hypertension Continue home regimen of atenolol and Norvasc 5 mg daily and uptitrate if needed.   DVT prophylaxis: SCDs Code Status: Full Family Communication: Updated patient and family at bedside. Disposition Plan: Home when workup is complete a medically stable, and per GI   Consultants:   Gastroenterology: Dr. Evette Cristal 10/31/2015  Interventional radiology: Dr. Lowella Dandy 11/03/2015  Procedures:   CT abdomen and pelvis 10/30/2015  Chest x-ray 11/01/2015  Unsuccessful ERCP 11/02/2015 per Dr.Magod  Percutaneous chance hepatic cholangiogram per Dr. Lowella Dandy 05/06/2015  Antimicrobials:   IV Unasyn 10/31/2015   Subjective: Patient sitting up on the side of  the bed. Patient states she's feeling better. Patient denies any chest pain. No shortness  of breath. Patient complaining of loose stools. Patient was to continue on a full liquid diet at this time.  Objective: Vitals:   11/03/15 2101 11/03/15 2124 11/04/15 0522 11/04/15 1014  BP:  128/67 (!) 114/55   Pulse:  90 84   Resp:  18 16   Temp:  100.2 F (37.9 C) 98.7 F (37.1 C)   TempSrc:  Oral Oral   SpO2: 97% 93% 92% 94%  Weight:      Height:        Intake/Output Summary (Last 24 hours) at 11/04/15 1256 Last data filed at 11/04/15 1228  Gross per 24 hour  Intake          2508.75 ml  Output             2577 ml  Net           -68.25 ml   Filed Weights   10/30/15 0927 10/30/15 1757 11/02/15 1400  Weight: 77.1 kg (170 lb) 81.2 kg (179 lb) 81.2 kg (179 lb)    Examination:  General exam: Appears calm and comfortable.Slightly jaundiced. Respiratory system: CTAB anterior lung fields. Respiratory effort normal. Cardiovascular system: S1 & S2 heard, RRR. No JVD, murmurs, rubs, gallops or clicks. No pedal edema. Gastrointestinal system: Abdomen is nondistended, soft and nontender. No organomegaly or masses felt. Normal bowel sounds heard. Drain with biliary drainage noted. Central nervous system: Alert and oriented. No focal neurological deficits. Extremities: Symmetric 5 x 5 power. Skin: No rashes, lesions or ulcers Psychiatry: Judgement and insight appear normal. Mood & affect appropriate.     Data Reviewed: I have personally reviewed following labs and imaging studies  CBC:  Recent Labs Lab 10/30/15 1010 10/30/15 1819 10/31/15 0516 11/01/15 1040 11/02/15 0815 11/04/15 0439  WBC 9.5 8.8 8.4 11.1* 16.0* 9.8  NEUTROABS 8.0*  --   --  9.7* 14.1* 8.1*  HGB 12.7 11.7* 12.4 11.1* 11.2* 8.7*  HCT 37.3 35.3* 38.3 33.7* 33.3* 26.2*  MCV 86.3 89.1 88.0 86.9 86.9 87.6  PLT 230 211 187 191 224 222   Basic Metabolic Panel:  Recent Labs Lab 10/30/15 1010 10/30/15 1819  10/31/15 0516 11/01/15 1040 11/02/15 0600 11/04/15 0439 11/04/15 1013  NA 132*  --  132* 135 132*  --  133*  K 3.4*  --  4.0 3.1* 3.5  --  3.6  CL 92*  --  99* 102 100*  --  102  CO2 30  --  22 25 25   --  25  GLUCOSE 137*  --  128* 116* 149*  --  96  BUN 13  --  9 6 6   --  11  CREATININE 0.61 0.67 0.57 0.49 0.45  --  0.59  CALCIUM 8.8*  --  8.3* 8.0* 7.7*  --  7.9*  MG  --  2.2  --  2.1 2.0 2.0  --   PHOS  --  2.0*  --  1.3* 2.0*  --   --    GFR: Estimated Creatinine Clearance: 61.8 mL/min (by C-G formula based on SCr of 0.8 mg/dL). Liver Function Tests:  Recent Labs Lab 10/30/15 1010 11/01/15 1040 11/02/15 0600 11/04/15 0439  AST 185* 81* 86* 30  ALT 231* 142* 129* 77*  ALKPHOS 274* 257* 257* 160*  BILITOT 6.3* 7.4* 7.1* 4.9*  PROT 7.8 5.6* 5.5* 4.4*  ALBUMIN 3.4* 2.3* 2.0* 1.7*    Recent Labs Lab 10/30/15 1010  LIPASE 23   No results for input(s): AMMONIA in  the last 168 hours. Coagulation Profile:  Recent Labs Lab 11/03/15 0751  INR 1.10   Cardiac Enzymes: No results for input(s): CKTOTAL, CKMB, CKMBINDEX, TROPONINI in the last 168 hours. BNP (last 3 results) No results for input(s): PROBNP in the last 8760 hours. HbA1C: No results for input(s): HGBA1C in the last 72 hours. CBG: No results for input(s): GLUCAP in the last 168 hours. Lipid Profile: No results for input(s): CHOL, HDL, LDLCALC, TRIG, CHOLHDL, LDLDIRECT in the last 72 hours. Thyroid Function Tests: No results for input(s): TSH, T4TOTAL, FREET4, T3FREE, THYROIDAB in the last 72 hours. Anemia Panel:  Recent Labs  11/04/15 1013  VITAMINB12 784  FOLATE 24.6  FERRITIN 186  TIBC 202*  IRON 59  RETICCTPCT 1.3   Sepsis Labs: No results for input(s): PROCALCITON, LATICACIDVEN in the last 168 hours.  Recent Results (from the past 240 hour(s))  Culture, blood (routine x 2)     Status: None (Preliminary result)   Collection Time: 10/31/15  6:40 AM  Result Value Ref Range Status    Specimen Description BLOOD LEFT HAND  Final   Special Requests IN PEDIATRIC BOTTLE 0.1CC  Final   Culture NO GROWTH 3 DAYS  Final   Report Status PENDING  Incomplete  Culture, blood (routine x 2)     Status: None (Preliminary result)   Collection Time: 10/31/15  6:53 AM  Result Value Ref Range Status   Specimen Description BLOOD LEFT HAND  Final   Special Requests IN PEDIATRIC BOTTLE 0.2CC  Final   Culture NO GROWTH 3 DAYS  Final   Report Status PENDING  Incomplete  Culture, body fluid-bottle     Status: None (Preliminary result)   Collection Time: 11/03/15  2:55 PM  Result Value Ref Range Status   Specimen Description FLUID BILE  Final   Special Requests BOTTLES DRAWN AEROBIC AND ANAEROBIC 10CC  Final   Gram Stain   Final    GRAM VARIABLE ROD IN BOTH AEROBIC AND ANAEROBIC BOTTLES BOTTLE GROWTH CONSISTENT WITH ORIGINAL GRAM STAIN RESULTS    Culture TOO YOUNG TO READ  Final   Report Status PENDING  Incomplete  Gram stain     Status: None   Collection Time: 11/03/15  2:55 PM  Result Value Ref Range Status   Specimen Description FLUID BILE  Final   Special Requests NONE  Final   Gram Stain   Final    FEW WBC PRESENT, PREDOMINANTLY PMN MODERATE GRAM NEGATIVE RODS FEW GRAM POSITIVE RODS    Report Status 11/03/2015 FINAL  Final         Radiology Studies: Dg C-arm 61-120 Min-no Report  Result Date: 11/02/2015 CLINICAL DATA: obstructive jaundice C-ARM 61-120 MINUTES Fluoroscopy was utilized by the requesting physician.  No radiographic interpretation.   Ir Int Ezzard Standing Biliary Drain With Cholangiogram  Result Date: 11/03/2015 INDICATION: 75 year old with obstructive jaundice. CT imaging demonstrates an obstruction in the common bile duct. Unclear if this is related to a stone or tumor. Unfortunately, biliary system could not be decompressed or further evaluated from an endoscopic approach. Patient needs biliary decompression and diagnostic imaging of the biliary system. EXAM:  PERCUTANEOUS TRANSHEPATIC CHOLANGIOGRAM WITH ULTRASOUND AND FLUOROSCOPIC GUIDANCE PLACEMENT OF INTERNAL/ EXTERNAL BILIARY DRAIN MEDICATIONS: Unasyn 3 g; The antibiotic was administered within an appropriate time frame prior to the initiation of the procedure. ANESTHESIA/SEDATION: Moderate (conscious) sedation was employed during this procedure. A total of Versed 2.0 mg and Fentanyl 75 mcg was administered intravenously. Moderate Sedation Time: 35  minutes. The patient's level of consciousness and vital signs were monitored continuously by radiology nursing throughout the procedure under my direct supervision. FLUOROSCOPY TIME:  Fluoroscopy Time: 8 minutes, 48 mGy COMPLICATIONS: None immediate. PROCEDURE: Informed written consent was obtained from the patient after a thorough discussion of the procedural risks, benefits and alternatives. All questions were addressed. Maximal Sterile Barrier Technique was utilized including caps, mask, sterile gowns, sterile gloves, sterile drape, hand hygiene and skin antiseptic. A timeout was performed prior to the initiation of the procedure. Patient was placed supine on the interventional table. The anterior abdomen was prepped and draped in a sterile fashion. Dilated intrahepatic ducts identified with ultrasound. Skin was anesthetized with 1% lidocaine. Using ultrasound guidance, a 22 gauge needle was directed into a dilated intrahepatic duct in the left hepatic lobe. Contrast injection confirmed placement in the biliary system. Wire would not advance from this access due to excessive patient breathing. Needle was redirected slightly more central at a different angle. Contrast was again injected and confirmed placement in the biliary system. A wire was successfully advanced into the central biliary system. Tract was dilated with an Accustick dilator set. A 5 [place] the catheter was advanced beyond the common bile duct obstruction using a Glidewire. Glidewire and catheter  were advanced into the duodenum. Tract was dilated to accommodate a 10 [place] biliary drain. The distal aspect was advanced into the duodenum. Biliary sample was sent for cytology and culture. Small amount of contrast was injected to confirm placement. Catheter was sutured to skin. Dressing placed over the catheter. FINDINGS: Severe intrahepatic and extrahepatic biliary dilatation. Access obtained from a dilated branch in the lateral left hepatic lobe. At least 3 large intraluminal filling defects compatible with stones. There are additional smaller stones in the common bile duct and dilated cystic duct. Drainage catheter was successfully advanced into the duodenum. IMPRESSION: Successful percutaneous transhepatic cholangiogram and placement of internal/external biliary drain. Drain was placed from a left hepatic approach. Multiple large filling defects in the extrahepatic biliary system are compatible with stones. PLAN: Follow drain output and lab values. Will need to coordinate stone management with GI. Electronically Signed   By: Richarda OverlieAdam  Henn M.D.   On: 11/03/2015 19:23        Scheduled Meds: . amLODipine  5 mg Oral Daily  . ampicillin-sulbactam (UNASYN) IV  3 g Intravenous Q6H  . arformoterol  15 mcg Nebulization BID  . aspirin EC  81 mg Oral QHS  . atenolol  25 mg Oral Daily  . budesonide (PULMICORT) nebulizer solution  0.25 mg Nebulization BID  . cholestyramine  4 g Oral BID  . gabapentin  300 mg Oral BID  . metoCLOPramide  10 mg Oral Q6H  . pantoprazole  40 mg Oral Daily  . potassium chloride  10 mEq Oral Daily  . sodium phosphate  Dextrose 5% IVPB  15 mmol Intravenous Once   Continuous Infusions: . dextrose 5 % and 0.9 % NaCl with KCl 20 mEq/L 75 mL/hr at 11/04/15 1119     LOS: 5 days    Time spent: 35 minutes    THOMPSON,DANIEL, MD [facility] Pager [phone]  If 7PM-7AM, please contact night-coverage [email or web address] Password [email or web address] 11/04/2015, 12:56 PM

## 2015-11-04 NOTE — Progress Notes (Signed)
Internal/external biliary drain is now in place. Patient states she feels much better. Fortunately it appears this is all choledocholithiasis causing the obstruction of the biliary tree. Dr. Ewing SchleinMagod is aware of all of this and will plan repeat ERCP on Tuesday at 10 AM.

## 2015-11-04 NOTE — Evaluation (Signed)
Physical Therapy Evaluation Patient Details Name: Amy Sheppard MRN: [id number] DOB: Dec 29, 1940 Today's Date: 11/04/2015   History of Present Illness  This 75 year old female was admitted with nausea and poor intake and found to have obstructive jaundice. She is s/p biliary drain.  H/O HTN  Clinical Impression  Pt admitted with/for nausea and found to have obstructive jaundice.  S/p ERCP.  Pt currently limited functionally due to the problems listed below.  (see problems list.)  Pt will benefit from PT to maximize function and safety to be able to get home safely with available assist of family.     Follow Up Recommendations No PT follow up;Supervision - Intermittent;Supervision for mobility/OOB    Equipment Recommendations  None recommended by PT    Recommendations for Other Services       Precautions / Restrictions Precautions Precautions: Fall Restrictions Weight Bearing Restrictions: No      Mobility  Bed Mobility Overal bed mobility: Independent                Transfers Overall transfer level: Needs assistance Equipment used: None Transfers: Sit to/from Stand Sit to Stand: Supervision            Ambulation/Gait Ambulation/Gait assistance: Min guard;Supervision Ambulation Distance (Feet): 150 Feet Assistive device: None Gait Pattern/deviations: Step-through pattern;Scissoring;Drifts right/left   Gait velocity interpretation: Below normal speed for age/gender General Gait Details: mildly unsteady with occasional drifting or scissoring to maintain balance   Stairs            Wheelchair Mobility    Modified Rankin (Stroke Patients Only)       Balance                                             Pertinent Vitals/Pain Pain Assessment: Faces Faces Pain Scale: No hurt Pain Location: drain site Pain Descriptors / Indicators: Sore Pain Intervention(s): Limited activity within patient's tolerance;Monitored during  session;Repositioned (pinned drain to gown)    Home Living Family/patient expects to be discharged to:: Private residence Living Arrangements: Alone Available Help at Discharge: Other (Comment) (sister will come to assist her.) Type of Home: House Home Access: Stairs to enter Entrance Stairs-Rails: Doctor, general practice of Steps: 3 Home Layout: One level Home Equipment: Shower seat      Prior Function Level of Independence: Independent         Comments: adls/iadls. Will have sister stay with her post d/c     Hand Dominance        Extremity/Trunk Assessment   Upper Extremity Assessment: Defer to OT evaluation           Lower Extremity Assessment: Overall WFL for tasks assessed (proximal weaknesses incl trunk)         Communication   Communication: No difficulties  Cognition Arousal/Alertness: Awake/alert Behavior During Therapy: WFL for tasks assessed/performed Overall Cognitive Status: Within Functional Limits for tasks assessed                      General Comments      Exercises        Assessment/Plan    PT Assessment Patient needs continued PT services  PT Diagnosis Generalized weakness;Difficulty walking   PT Problem List Decreased strength;Decreased activity tolerance;Decreased balance;Decreased mobility  PT Treatment Interventions Gait training;Stair training;Functional mobility training;Therapeutic activities;Balance training;Patient/family education   PT Goals (Current  goals can be found in the Care Plan section) Acute Rehab PT Goals Patient Stated Goal: home PT Goal Formulation: With patient Time For Goal Achievement: 11/11/15 Potential to Achieve Goals: Good    Frequency Min 3X/week   Barriers to discharge        Co-evaluation               End of Session   Activity Tolerance: Patient tolerated treatment well Patient left: in bed;with family/visitor present;with call bell/phone within reach  (EOB) Nurse Communication: Mobility status         Time: [id number] PT Time Calculation (min) (ACUTE ONLY): 29 min   Charges:   PT Evaluation $PT Eval Low Complexity: 1 Procedure PT Treatments $Gait Training: 8-22 mins   PT G Codes:        Zykeria Laguardia, Eliseo GumKenneth V 11/04/2015, 5:39 PM 11/04/2015  Morrow BingKen Gabriella Guile, PT [phone]  (pager)

## 2015-11-04 NOTE — Progress Notes (Signed)
Referring Physician(s): Dr Herbert Moors  Supervising Physician: Jolaine Click  Patient Status:  Inpatient  Chief Complaint:  Obstructive jaundice secondary stones Procedure Details:  Successful PTC from left hepatic approach.  Successful placement of left int/ext biliary drain.  Subjective:  Feels better already Output is great All labs down TB 4.9 (7.1)   Allergies: Tetanus toxoids  Medications: Prior to Admission medications   Medication Sig Start Date End Date Taking? Authorizing Provider  acetaminophen (TYLENOL) 325 MG tablet Take 325 mg by mouth every 6 (six) hours as needed for headache.   Yes Historical Provider, MD  aspirin EC 81 MG tablet Take 81 mg by mouth at bedtime.   Yes Historical Provider, MD  atenolol (TENORMIN) 25 MG tablet Take 25 mg by mouth daily.   Yes Historical Provider, MD  gabapentin (NEURONTIN) 300 MG capsule Take 300 mg by mouth 2 (two) times daily. 06/09/15 06/08/16 Yes Historical Provider, MD  hydrochlorothiazide (HYDRODIURIL) 12.5 MG tablet Take 12.5 mg by mouth daily.   Yes Historical Provider, MD  omeprazole (PRILOSEC) 40 MG capsule Take 40 mg by mouth daily.   Yes Historical Provider, MD  potassium chloride (K-DUR,KLOR-CON) 10 MEQ tablet Take 10 mEq by mouth daily.   Yes Historical Provider, MD  simvastatin (ZOCOR) 20 MG tablet Take 20 mg by mouth daily at 6 PM.   Yes Historical Provider, MD  Vitamins/Minerals TABS Take 1 tablet by mouth daily.   Yes Historical Provider, MD     Vital Signs: BP (!) 114/55 (BP Location: Left Arm)   Pulse 84   Temp 98.7 F (37.1 C) (Oral)   Resp 16   Ht 5\' 2"  (1.575 m)   Wt 179 lb (81.2 kg)   SpO2 94%   BMI 32.74 kg/m   Physical Exam  Constitutional: She is oriented to person, place, and time.  Neurological: She is alert and oriented to person, place, and time.  Skin: Skin is warm and dry.  Site is clean and dry NT No bleeding  Output brown; junky fluid 1.3 L yesterday 350 cc in bag now    Psychiatric: She has a normal mood and affect. Her behavior is normal. Judgment and thought content normal.    Imaging: Dg Chest 2 View  Result Date: 11/01/2015 CLINICAL DATA:  Patient minute 5 days ago for vomiting and weakness EXAM: CHEST  2 VIEW COMPARISON:  March 21, 2012 FINDINGS: The heart size and mediastinal contours are within normal limits. Both lungs are clear. The visualized skeletal structures are unremarkable. IMPRESSION: No active cardiopulmonary disease. Electronically Signed   By: March 23, 2012 III M.D   On: 11/01/2015 14:28   Dg C-arm 61-120 Min-no Report  Result Date: 11/02/2015 CLINICAL DATA: obstructive jaundice C-ARM 61-120 MINUTES Fluoroscopy was utilized by the requesting physician.  No radiographic interpretation.   Ir Int 01/02/2016 Biliary Drain With Cholangiogram  Result Date: 11/03/2015 INDICATION: 75 year old with obstructive jaundice. CT imaging demonstrates an obstruction in the common bile duct. Unclear if this is related to a stone or tumor. Unfortunately, biliary system could not be decompressed or further evaluated from an endoscopic approach. Patient needs biliary decompression and diagnostic imaging of the biliary system. EXAM: PERCUTANEOUS TRANSHEPATIC CHOLANGIOGRAM WITH ULTRASOUND AND FLUOROSCOPIC GUIDANCE PLACEMENT OF INTERNAL/ EXTERNAL BILIARY DRAIN MEDICATIONS: Unasyn 3 g; The antibiotic was administered within an appropriate time frame prior to the initiation of the procedure. ANESTHESIA/SEDATION: Moderate (conscious) sedation was employed during this procedure. A total of Versed 2.0 mg and Fentanyl 75  mcg was administered intravenously. Moderate Sedation Time: 35 minutes. The patient's level of consciousness and vital signs were monitored continuously by radiology nursing throughout the procedure under my direct supervision. FLUOROSCOPY TIME:  Fluoroscopy Time: 8 minutes, 48 mGy COMPLICATIONS: None immediate. PROCEDURE: Informed written consent was obtained  from the patient after a thorough discussion of the procedural risks, benefits and alternatives. All questions were addressed. Maximal Sterile Barrier Technique was utilized including caps, mask, sterile gowns, sterile gloves, sterile drape, hand hygiene and skin antiseptic. A timeout was performed prior to the initiation of the procedure. Patient was placed supine on the interventional table. The anterior abdomen was prepped and draped in a sterile fashion. Dilated intrahepatic ducts identified with ultrasound. Skin was anesthetized with 1% lidocaine. Using ultrasound guidance, a 22 gauge needle was directed into a dilated intrahepatic duct in the left hepatic lobe. Contrast injection confirmed placement in the biliary system. Wire would not advance from this access due to excessive patient breathing. Needle was redirected slightly more central at a different angle. Contrast was again injected and confirmed placement in the biliary system. A wire was successfully advanced into the central biliary system. Tract was dilated with an Accustick dilator set. A 5 [place] the catheter was advanced beyond the common bile duct obstruction using a Glidewire. Glidewire and catheter were advanced into the duodenum. Tract was dilated to accommodate a 10 [place] biliary drain. The distal aspect was advanced into the duodenum. Biliary sample was sent for cytology and culture. Small amount of contrast was injected to confirm placement. Catheter was sutured to skin. Dressing placed over the catheter. FINDINGS: Severe intrahepatic and extrahepatic biliary dilatation. Access obtained from a dilated branch in the lateral left hepatic lobe. At least 3 large intraluminal filling defects compatible with stones. There are additional smaller stones in the common bile duct and dilated cystic duct. Drainage catheter was successfully advanced into the duodenum. IMPRESSION: Successful percutaneous transhepatic cholangiogram and placement of  internal/external biliary drain. Drain was placed from a left hepatic approach. Multiple large filling defects in the extrahepatic biliary system are compatible with stones. PLAN: Follow drain output and lab values. Will need to coordinate stone management with GI. Electronically Signed   By: Markus Daft M.D.   On: 11/03/2015 19:23    Labs:  CBC:  Recent Labs  10/31/15 0516 11/01/15 1040 11/02/15 0815 11/04/15 0439  WBC 8.4 11.1* 16.0* 9.8  HGB 12.4 11.1* 11.2* 8.7*  HCT 38.3 33.7* 33.3* 26.2*  PLT 187 191 224 222    COAGS:  Recent Labs  11/03/15 0751  INR 1.10  APTT 26    BMP:  Recent Labs  10/30/15 1010 10/30/15 1819 10/31/15 0516 11/01/15 1040 11/02/15 0600  NA 132*  --  132* 135 132*  K 3.4*  --  4.0 3.1* 3.5  CL 92*  --  99* 102 100*  CO2 30  --  '22 25 25  '$ GLUCOSE 137*  --  128* 116* 149*  BUN 13  --  '9 6 6  '$ CALCIUM 8.8*  --  8.3* 8.0* 7.7*  CREATININE 0.61 0.67 0.57 0.49 0.45  GFRNONAA >60 >60 >60 >60 >60  GFRAA >60 >60 >60 >60 >60    LIVER FUNCTION TESTS:  Recent Labs  10/30/15 1010 11/01/15 1040 11/02/15 0600 11/04/15 0439  BILITOT 6.3* 7.4* 7.1* 4.9*  AST 185* 81* 86* 30  ALT 231* 142* 129* 77*  ALKPHOS 274* 257* 257* 160*  PROT 7.8 5.6* 5.5* 4.4*  ALBUMIN  3.4* 2.3* 2.0* 1.7*    Assessment and Plan:  Int/Ext biliary drain placed 8/10 in IR Doing well Draining well Plan per GI  Electronically Signed: Kijuana Ruppel A 11/04/2015, 11:08 AM   I spent a total of 15 Minutes at the the patient's bedside AND on the patient's hospital floor or unit, greater than 50% of which was counseling/coordinating care for biliary drain

## 2015-11-04 NOTE — Evaluation (Signed)
Occupational Therapy Evaluation Patient Details Name: Amy ShanksGlenda Duplantis MRN: [id number] DOB: Aug 07, 1940 Today's Date: 11/04/2015    History of Present Illness This 75 year old female was admitted with poor intake and found to have obstructive jaundice. She is s/p biliary drain.     Clinical Impression   Pt was admitted for the above. All education was completed. No further OT is needed at this time    Follow Up Recommendations  No OT follow up;Supervision/Assistance - 24 hour    Equipment Recommendations  None recommended by OT    Recommendations for Other Services       Precautions / Restrictions Precautions Precautions: Fall Restrictions Weight Bearing Restrictions: No      Mobility Bed Mobility Overal bed mobility: Independent                Transfers Overall transfer level: Needs assistance Equipment used: None Transfers: Sit to/from Stand Sit to Stand: Supervision              Balance                                            ADL Overall ADL's : Needs assistance/impaired                         Toilet Transfer: Min guard;Ambulation;Regular Toilet       Tub/ Engineer, structuralhower Transfer: Min guard;Ambulation     General ADL Comments: pt can perform ADLs with set up, sit to stand.  Min guard when ambulating due to generalized weakness.  No LOB, but pt was a little unsteady. She states sister will stay with her. Granddaughter present and in room.  Educated on energy conservation and pt Web designerverbalizes understanding     Vision     Perception     Praxis      Pertinent Vitals/Pain Pain Assessment: Faces Faces Pain Scale: Hurts a little bit Pain Location: drain site Pain Descriptors / Indicators: Sore Pain Intervention(s): Limited activity within patient's tolerance;Monitored during session;Repositioned (pinned drain to gown)     Hand Dominance     Extremity/Trunk Assessment Upper Extremity Assessment Upper Extremity  Assessment: Overall WFL for tasks assessed           Communication Communication Communication: No difficulties   Cognition Arousal/Alertness: Awake/alert Behavior During Therapy: WFL for tasks assessed/performed Overall Cognitive Status: Within Functional Limits for tasks assessed                     General Comments       Exercises       Shoulder Instructions      Home Living Family/patient expects to be discharged to:: Private residence Living Arrangements: Alone                 Bathroom Shower/Tub: Producer, television/film/videoWalk-in shower   Bathroom Toilet: Handicapped height     Home Equipment: Shower seat          Prior Functioning/Environment Level of Independence: Independent        Comments: adls/iadls. Will have sister stay with her post d/c    OT Diagnosis: Generalized weakness   OT Problem List:     OT Treatment/Interventions:      OT Goals(Current goals can be found in the care plan section) Acute Rehab OT Goals Patient Stated Goal: home OT Goal Formulation:  All assessment and education complete, DC therapy  OT Frequency:     Barriers to D/C:            Co-evaluation              End of Session    Activity Tolerance: Patient tolerated treatment well Patient left: in bed;with call bell/phone within reach;with bed alarm set;with family/visitor present   Time: 1355-1409 OT Time Calculation (min): 14 min Charges:  OT General Charges $OT Visit: 1 Procedure OT Evaluation $OT Eval Low Complexity: 1 Procedure G-Codes:    Verenise Moulin 11-Nov-2015, 3:56 PM Marica Otter, OTR/L [phone] 11-Nov-2015

## 2015-11-04 NOTE — Care Management Important Message (Signed)
Important Message  Patient Details  Name: Amy Sheppard MRN: [id number] Date of Birth: 19-Jun-1940   Medicare Important Message Given:  Yes    Kyla BalzarineShealy, Tyreesha Maharaj Abena 11/04/2015, 11:21 AM

## 2015-11-05 LAB — CULTURE, BLOOD (ROUTINE X 2): Culture: NO GROWTH

## 2015-11-05 LAB — BLOOD CULTURE ID PANEL (REFLEXED)

## 2015-11-05 LAB — COMPREHENSIVE METABOLIC PANEL
ALK PHOS: 147 U/L — AB (ref 38–126)
ALT: 57 U/L — AB (ref 14–54)
AST: 21 U/L (ref 15–41)
Albumin: 1.8 g/dL — ABNORMAL LOW (ref 3.5–5.0)
Anion gap: 7 (ref 5–15)
CALCIUM: 7.8 mg/dL — AB (ref 8.9–10.3)
CHLORIDE: 100 mmol/L — AB (ref 101–111)
CO2: 25 mmol/L (ref 22–32)
CREATININE: 0.53 mg/dL (ref 0.44–1.00)
GFR calc Af Amer: >60 mL/min (ref >60)
Glucose, Bld: 107 mg/dL — ABNORMAL HIGH (ref 65–99)
Potassium: 3.3 mmol/L — ABNORMAL LOW (ref 3.5–5.1)
Sodium: 132 mmol/L — ABNORMAL LOW (ref 135–145)
Total Bilirubin: 2.8 mg/dL — ABNORMAL HIGH (ref 0.3–1.2)
Total Protein: 4.8 g/dL — ABNORMAL LOW (ref 6.5–8.1)

## 2015-11-05 LAB — PHOSPHORUS: Phosphorus: 3.6 mg/dL (ref 2.5–4.6)

## 2015-11-05 LAB — CBC
HCT: 26.1 % — ABNORMAL LOW (ref 36.0–46.0)
Hemoglobin: 8.6 g/dL — ABNORMAL LOW (ref 12.0–15.0)
MCH: 28.2 pg (ref 26.0–34.0)
MCHC: 33 g/dL (ref 30.0–36.0)
MCV: 85.6 fL (ref 78.0–100.0)
PLATELETS: 259 10*3/uL (ref 150–400)
RBC: 3.05 MIL/uL — ABNORMAL LOW (ref 3.87–5.11)
RDW: 14.7 % (ref 11.5–15.5)
WBC: 8.9 10*3/uL (ref 4.0–10.5)

## 2015-11-05 LAB — MAGNESIUM: MAGNESIUM: 1.8 mg/dL (ref 1.7–2.4)

## 2015-11-05 MED ORDER — DEXTROSE 5 % IV SOLN
3.0000 g | Freq: Once | INTRAVENOUS | Status: AC
  Administered 2015-11-05: 3 g via INTRAVENOUS
  Filled 2015-11-05: qty 6

## 2015-11-05 NOTE — Progress Notes (Signed)
[facility]  Amy ShanksGlenda Sheppard 75 y.o. [birth year]   Subjective: Patient  S/P Internal External Biliary drain placement. Feeling better. Denied fever, abdominal pain.   Objective: Vital signs in last 24 hours: Vitals:   11/04/15 2155 11/05/15 0524  BP: (!) 148/69 131/64  Pulse: (!) 44 81  Resp: 19 18  Temp: 98.4 F (36.9 C) 98.4 F (36.9 C)    Physical Exam:  General:  Alert, cooperative, no distress, appears stated age  Head:  Normocephalic, without obvious abnormality, atraumatic  Eyes:  , EOM's intact, Scleral Icterus   Lungs:   Clear to auscultation bilaterally, respirations unlabored  Heart:  Regular rate and rhythm, S1, S2 normal  Abdomen:   Soft,Drain intact with bilious fluid in the bag, Non tender , BS +   Extremities: Extremities normal, atraumatic, no  edema  Pulses: 2+ and symmetric    Lab Results:  Recent Labs  11/04/15 0439 11/04/15 1013  NA  --  133*  K  --  3.6  CL  --  102  CO2  --  25  GLUCOSE  --  96  BUN  --  11  CREATININE  --  0.59  CALCIUM  --  7.9*  MG 2.0  --     Recent Labs  11/04/15 0439  AST 30  ALT 77*  ALKPHOS 160*  BILITOT 4.9*  PROT 4.4*  ALBUMIN 1.7*    Recent Labs  11/04/15 0439 11/05/15 0808  WBC 9.8 8.9  NEUTROABS 8.1*  --   HGB 8.7* 8.6*  HCT 26.2* 26.1*  MCV 87.6 85.6  PLT 222 259    Recent Labs  11/03/15 0751  LABPROT 14.2  INR 1.10      Assessment/Plan: - Obstructive Jaundice . Now with Internal and External biliary drain  -  Obstructing hyperdense mass versus stone in the common bile duct, measuring approximately        1.2 cm greatest dimension  PLAN ------- - Monitor LFts  - Possible ERCP on Monday  - Continue Abx for now.   Amy Sheppard 11/05/2015, 9:01 AM  Pager [phone]  If no answer or after 5 PM call [phone]

## 2015-11-05 NOTE — Progress Notes (Signed)
PHARMACY - PHYSICIAN COMMUNICATION CRITICAL VALUE ALERT - BLOOD CULTURE IDENTIFICATION (BCID)  No results found for this or any previous visit.  Name of physician (or Provider) Contacted: Ramiro Harvestaniel Thompson  Changes to prescribed antibiotics required: pt has negative BCID and gram variable coccobacilli on gram stain from 1 of 2 blood cultures drawn from the same hand. Could be contaminant and recommended deescalating antibiotics. MD wants to continue.  Arlean Hoppingorey M. Newman PiesBall, PharmD, BCPS Clinical Pharmacist 11/05/2015  1:08 PM

## 2015-11-05 NOTE — Progress Notes (Signed)
PROGRESS NOTE    Amy Sheppard  ZOX:[id number] DOB: 13-May-1940 DOA: 10/30/2015 PCP: Gildardo Griffes, MD    Brief Narrative:  Amy Sheppard a 75 y.o.femalewith medical history significant of hypertension who presents with 5 day complaint of poor oral intake. Started on Wednesday night when patient had nausea and emesis. Has felt weak since. Has had poor oral intake as well. At first she thought it was something she ate Wednesday but the problem has persisted. Given her progressive weakness she decided to come to the hospital for further evaluation recommendations. Nothing she is aware of makes it better eating makes it worse.The problem occurred insidiously.   Assessment & Plan:   Principal Problem:   Obstructive jaundice Active Problems:   Calculus of bile duct with obstruction and without cholangitis or cholecystitis   Common bile duct dilatation   Transaminitis   Hypophosphatemia   Hypokalemia   Wheezing   Essential hypertension   Dehydration   SOB (shortness of breath)   #1 obstructive jaundice secondary to common bile duct stones/choledocholithiasis Patient had presented with nausea, vomiting noted to be jaundiced. Patient noted to have a transaminitis. CT abdomen and pelvis with an obstructing hyperdense mass versus stone in the common bile duct measuring 1.2 cm causing market CBD dilatation and intrahepatic bile duct dilatation. LFTs trending down. Patient has been assessed by GI and status post failed ERCP with balloon dilatation of distal duodenum and diagnostic endoscopic procedure 11/02/2015. Patient was seen in consultation by interventional radiology and subsequently underwent percutaneous transhepatic cholangiogram with left internal/external biliary drain and fluid sent for culture and cytology. Findings on PT cholangiogram did show severe biliary dilatation with multiple large filling defects in the, bile duct consistent with stones. Place patient on clear liquids and  advance slowly. IR and GI following. CT abdomen and pelvis on admission did show gallbladder distention containing small amount of layering stones and/or sludge. GI and IR following and appreciate input and recommendations. Patient for repeat ERCP on Monday per GI.?? Elective cholecystectomy once patient over acute illness. Continue IV Unasyn.   #2 hypokalemia/hypophosphatemia Likely secondary to GI losses. Replete.   #3 transaminitis Likely secondary to problem #1. LFTs trending down. Bilirubin trending back down. Patient s/p failed ERCP 11/02/2015 with balloon dilatation of distal duodenum and diagnostic endoscopy procedure. Patient underwent successful percutaneous transhepatic cholangiogram with internal/external biliary drain placement. Interventional radiology. Monitor LFTs closely. Continue empiric IV antibiotics. Per GI patient for probable ERCP on Monday. GI and IR following and appreciate input and recommendations.   #4 wheezing Patient noted to be wheezing on examination 11/01/15, which has since resolved with a dose of IV Solu-Medrol, scheduled nebs, Pulmicort and Brovana. Follow.  #5 leukocytosis Patient with a worsening leukocytosis with a white count of 16,000 (11/02/2015) from 11,000. Leukocytosis improved and white count currently at 8.9. Likely secondary to IV Solu-Medrol dose which was given on 11/01/2015. Patient currently on IV Unasyn. Follow.  #6 hypertension Continue home regimen of atenolol and Norvasc 5 mg daily and uptitrate if needed.   DVT prophylaxis: SCDs Code Status: Full Family Communication: Updated patient and family at bedside. Disposition Plan: Home when workup is complete a medically stable, and per GI   Consultants:   Gastroenterology: Dr. Evette Cristal 10/31/2015  Interventional radiology: Dr. Lowella Dandy 11/03/2015  Procedures:   CT abdomen and pelvis 10/30/2015  Chest x-ray 11/01/2015  Unsuccessful ERCP 11/02/2015 per Dr.Magod  Percutaneous chance hepatic  cholangiogram per Dr. Lowella Dandy 05/06/2015  Antimicrobials:   IV Unasyn 10/31/2015   Subjective:  Patient denies any chest. No shortness of breath. Patient states loose stools have improved on Questran. Patient denies any abdominal pain. Patient prefers to remain on full liquids until workup has been completed in entirety.  Objective: Vitals:   11/04/15 1014 11/04/15 2155 11/05/15 0524 11/05/15 0925  BP:  (!) 148/69 131/64   Pulse:  (!) 44 81 82  Resp:  19 18 18   Temp:  98.4 F (36.9 C) 98.4 F (36.9 C)   TempSrc:  Oral Oral   SpO2: 94% 95% 90% 93%  Weight:      Height:        Intake/Output Summary (Last 24 hours) at 11/05/15 1122 Last data filed at 11/05/15 0952  Gross per 24 hour  Intake              260 ml  Output             2725 ml  Net            -2465 ml   Filed Weights   10/30/15 0927 10/30/15 1757 11/02/15 1400  Weight: 77.1 kg (170 lb) 81.2 kg (179 lb) 81.2 kg (179 lb)    Examination:  General exam: Appears calm and comfortable.Slightly jaundiced. Respiratory system: CTAB. Respiratory effort normal. Cardiovascular system: S1 & S2 heard, RRR. No JVD, murmurs, rubs, gallops or clicks. No pedal edema. Gastrointestinal system: Abdomen is nondistended, soft and nontender. No organomegaly or masses felt. Normal bowel sounds heard. Drain with biliary drainage noted. Central nervous system: Alert and oriented. No focal neurological deficits. Extremities: Symmetric 5 x 5 power. Skin: No rashes, lesions or ulcers Psychiatry: Judgement and insight appear normal. Mood & affect appropriate.     Data Reviewed: I have personally reviewed following labs and imaging studies  CBC:  Recent Labs Lab 10/30/15 1010  10/31/15 0516 11/01/15 1040 11/02/15 0815 11/04/15 0439 11/05/15 0808  WBC 9.5  < > 8.4 11.1* 16.0* 9.8 8.9  NEUTROABS 8.0*  --   --  9.7* 14.1* 8.1*  --   HGB 12.7  < > 12.4 11.1* 11.2* 8.7* 8.6*  HCT 37.3  < > 38.3 33.7* 33.3* 26.2* 26.1*  MCV 86.3  < >  88.0 86.9 86.9 87.6 85.6  PLT 230  < > 187 191 224 222 259  < > = values in this interval not displayed. Basic Metabolic Panel:  Recent Labs Lab 10/30/15 1819 10/31/15 0516 11/01/15 1040 11/02/15 0600 11/04/15 0439 11/04/15 1013 11/05/15 0808  NA  --  132* 135 132*  --  133* 132*  K  --  4.0 3.1* 3.5  --  3.6 3.3*  CL  --  99* 102 100*  --  102 100*  CO2  --  22 25 25   --  25 25  GLUCOSE  --  128* 116* 149*  --  96 107*  BUN  --  9 6 6   --  11 <5*  CREATININE 0.67 0.57 0.49 0.45  --  0.59 0.53  CALCIUM  --  8.3* 8.0* 7.7*  --  7.9* 7.8*  MG 2.2  --  2.1 2.0 2.0  --  1.8  PHOS 2.0*  --  1.3* 2.0*  --   --  3.6   GFR: Estimated Creatinine Clearance: 61.8 mL/min (by C-G formula based on SCr of 0.8 mg/dL). Liver Function Tests:  Recent Labs Lab 10/30/15 1010 11/01/15 1040 11/02/15 0600 11/04/15 0439 11/05/15 0808  AST 185* 81* 86* 30 21  ALT 231* 142*  129* 77* 57*  ALKPHOS 274* 257* 257* 160* 147*  BILITOT 6.3* 7.4* 7.1* 4.9* 2.8*  PROT 7.8 5.6* 5.5* 4.4* 4.8*  ALBUMIN 3.4* 2.3* 2.0* 1.7* 1.8*    Recent Labs Lab 10/30/15 1010  LIPASE 23   No results for input(s): AMMONIA in the last 168 hours. Coagulation Profile:  Recent Labs Lab 11/03/15 0751  INR 1.10   Cardiac Enzymes: No results for input(s): CKTOTAL, CKMB, CKMBINDEX, TROPONINI in the last 168 hours. BNP (last 3 results) No results for input(s): PROBNP in the last 8760 hours. HbA1C: No results for input(s): HGBA1C in the last 72 hours. CBG: No results for input(s): GLUCAP in the last 168 hours. Lipid Profile: No results for input(s): CHOL, HDL, LDLCALC, TRIG, CHOLHDL, LDLDIRECT in the last 72 hours. Thyroid Function Tests: No results for input(s): TSH, T4TOTAL, FREET4, T3FREE, THYROIDAB in the last 72 hours. Anemia Panel:  Recent Labs  11/04/15 1013  VITAMINB12 784  FOLATE 24.6  FERRITIN 186  TIBC 202*  IRON 59  RETICCTPCT 1.3   Sepsis Labs: No results for input(s): PROCALCITON,  LATICACIDVEN in the last 168 hours.  Recent Results (from the past 240 hour(s))  Culture, blood (routine x 2)     Status: None (Preliminary result)   Collection Time: 10/31/15  6:40 AM  Result Value Ref Range Status   Specimen Description BLOOD LEFT HAND  Final   Special Requests IN PEDIATRIC BOTTLE 0.1CC  Final   Culture NO GROWTH 4 DAYS  Final   Report Status PENDING  Incomplete  Culture, blood (routine x 2)     Status: None (Preliminary result)   Collection Time: 10/31/15  6:53 AM  Result Value Ref Range Status   Specimen Description BLOOD LEFT HAND  Final   Special Requests IN PEDIATRIC BOTTLE 0.2CC  Final   Culture NO GROWTH 4 DAYS  Final   Report Status PENDING  Incomplete  Culture, body fluid-bottle     Status: None (Preliminary result)   Collection Time: 11/03/15  2:55 PM  Result Value Ref Range Status   Specimen Description FLUID BILE  Final   Special Requests BOTTLES DRAWN AEROBIC AND ANAEROBIC 10CC  Final   Gram Stain   Final    GRAM VARIABLE ROD IN BOTH AEROBIC AND ANAEROBIC BOTTLES BOTTLE GROWTH CONSISTENT WITH ORIGINAL GRAM STAIN RESULTS    Culture TOO YOUNG TO READ  Final   Report Status PENDING  Incomplete  Gram stain     Status: None   Collection Time: 11/03/15  2:55 PM  Result Value Ref Range Status   Specimen Description FLUID BILE  Final   Special Requests NONE  Final   Gram Stain   Final    FEW WBC PRESENT, PREDOMINANTLY PMN MODERATE GRAM NEGATIVE RODS FEW GRAM POSITIVE RODS    Report Status 11/03/2015 FINAL  Final         Radiology Studies: Ir Int Ezzard Standing Biliary Drain With Cholangiogram  Result Date: 11/03/2015 INDICATION: 75 year old with obstructive jaundice. CT imaging demonstrates an obstruction in the common bile duct. Unclear if this is related to a stone or tumor. Unfortunately, biliary system could not be decompressed or further evaluated from an endoscopic approach. Patient needs biliary decompression and diagnostic imaging of the biliary  system. EXAM: PERCUTANEOUS TRANSHEPATIC CHOLANGIOGRAM WITH ULTRASOUND AND FLUOROSCOPIC GUIDANCE PLACEMENT OF INTERNAL/ EXTERNAL BILIARY DRAIN MEDICATIONS: Unasyn 3 g; The antibiotic was administered within an appropriate time frame prior to the initiation of the procedure. ANESTHESIA/SEDATION: Moderate (conscious) sedation was  employed during this procedure. A total of Versed 2.0 mg and Fentanyl 75 mcg was administered intravenously. Moderate Sedation Time: 35 minutes. The patient's level of consciousness and vital signs were monitored continuously by radiology nursing throughout the procedure under my direct supervision. FLUOROSCOPY TIME:  Fluoroscopy Time: 8 minutes, 48 mGy COMPLICATIONS: None immediate. PROCEDURE: Informed written consent was obtained from the patient after a thorough discussion of the procedural risks, benefits and alternatives. All questions were addressed. Maximal Sterile Barrier Technique was utilized including caps, mask, sterile gowns, sterile gloves, sterile drape, hand hygiene and skin antiseptic. A timeout was performed prior to the initiation of the procedure. Patient was placed supine on the interventional table. The anterior abdomen was prepped and draped in a sterile fashion. Dilated intrahepatic ducts identified with ultrasound. Skin was anesthetized with 1% lidocaine. Using ultrasound guidance, a 22 gauge needle was directed into a dilated intrahepatic duct in the left hepatic lobe. Contrast injection confirmed placement in the biliary system. Wire would not advance from this access due to excessive patient breathing. Needle was redirected slightly more central at a different angle. Contrast was again injected and confirmed placement in the biliary system. A wire was successfully advanced into the central biliary system. Tract was dilated with an Accustick dilator set. A 5 [place] Kumpe the catheter was advanced beyond the common bile duct obstruction using a Glidewire. Glidewire  and catheter were advanced into the duodenum. Tract was dilated to accommodate a 10 [place] biliary drain. The distal aspect was advanced into the duodenum. Biliary sample was sent for cytology and culture. Small amount of contrast was injected to confirm placement. Catheter was sutured to skin. Dressing placed over the catheter. FINDINGS: Severe intrahepatic and extrahepatic biliary dilatation. Access obtained from a dilated branch in the lateral left hepatic lobe. At least 3 large intraluminal filling defects compatible with stones. There are additional smaller stones in the common bile duct and dilated cystic duct. Drainage catheter was successfully advanced into the duodenum. IMPRESSION: Successful percutaneous transhepatic cholangiogram and placement of internal/external biliary drain. Drain was placed from a left hepatic approach. Multiple large filling defects in the extrahepatic biliary system are compatible with stones. PLAN: Follow drain output and lab values. Will need to coordinate stone management with GI. Electronically Signed   By: Richarda Overlie M.D.   On: 11/03/2015 19:23        Scheduled Meds: . amLODipine  5 mg Oral Daily  . ampicillin-sulbactam (UNASYN) IV  3 g Intravenous Q6H  . arformoterol  15 mcg Nebulization BID  . aspirin EC  81 mg Oral QHS  . atenolol  25 mg Oral Daily  . budesonide (PULMICORT) nebulizer solution  0.25 mg Nebulization BID  . cholestyramine  4 g Oral BID  . gabapentin  300 mg Oral BID  . magnesium sulfate 1 - 4 g bolus IVPB  3 g Intravenous Once  . metoCLOPramide  10 mg Oral Q6H  . pantoprazole  40 mg Oral Daily  . potassium chloride  10 mEq Oral Daily  . sodium phosphate  Dextrose 5% IVPB  15 mmol Intravenous Once   Continuous Infusions: . dextrose 5 % and 0.9 % NaCl with KCl 20 mEq/L 75 mL/hr at 11/05/15 0401     LOS: 6 days    Time spent: 35 minutes    Amy Fangman, MD [facility] Pager [phone]  If 7PM-7AM, please contact  night-coverage [email or web address] Password [facility] 11/05/2015, 11:22 AM

## 2015-11-05 NOTE — Progress Notes (Signed)
Patient ID: Amy Sheppard, female   DOB: 1940/09/25, 75 y.o.   MRN: [id number]    Referring Physician(s): Dr. Clarene Essex  Supervising Physician: Marybelle Killings  Patient Status: inpt  Chief Complaint: Obstructive jaundice  Subjective: Patient with no pain this morning.  Tolerating full liquids.  Drain is draining well.  Allergies: Tetanus toxoids  Medications: Prior to Admission medications   Medication Sig Start Date End Date Taking? Authorizing Provider  acetaminophen (TYLENOL) 325 MG tablet Take 325 mg by mouth every 6 (six) hours as needed for headache.   Yes Historical Provider, MD  aspirin EC 81 MG tablet Take 81 mg by mouth at bedtime.   Yes Historical Provider, MD  atenolol (TENORMIN) 25 MG tablet Take 25 mg by mouth daily.   Yes Historical Provider, MD  gabapentin (NEURONTIN) 300 MG capsule Take 300 mg by mouth 2 (two) times daily. 06/09/15 06/08/16 Yes Historical Provider, MD  hydrochlorothiazide (HYDRODIURIL) 12.5 MG tablet Take 12.5 mg by mouth daily.   Yes Historical Provider, MD  omeprazole (PRILOSEC) 40 MG capsule Take 40 mg by mouth daily.   Yes Historical Provider, MD  potassium chloride (K-DUR,KLOR-CON) 10 MEQ tablet Take 10 mEq by mouth daily.   Yes Historical Provider, MD  simvastatin (ZOCOR) 20 MG tablet Take 20 mg by mouth daily at 6 PM.   Yes Historical Provider, MD  Vitamins/Minerals TABS Take 1 tablet by mouth daily.   Yes Historical Provider, MD    Vital Signs: BP 131/64 (BP Location: Right Arm)   Pulse 82   Temp 98.4 F (36.9 C) (Oral)   Resp 18   Ht '5\' 2"'$  (1.575 m)   Wt 179 lb (81.2 kg)   SpO2 93%   BMI 32.74 kg/m   Physical Exam: Abd: soft, NT, ND, drain site is c/d/i.  Draining bilious output, 1450cc yesterday  Imaging: Dg Chest 2 View  Result Date: 11/01/2015 CLINICAL DATA:  Patient minute 5 days ago for vomiting and weakness EXAM: CHEST  2 VIEW COMPARISON:  March 21, 2012 FINDINGS: The heart size and mediastinal contours are within  normal limits. Both lungs are clear. The visualized skeletal structures are unremarkable. IMPRESSION: No active cardiopulmonary disease. Electronically Signed   By: Dorise Bullion III M.D   On: 11/01/2015 14:28   Dg C-arm 61-120 Min-no Report  Result Date: 11/02/2015 CLINICAL DATA: obstructive jaundice C-ARM 61-120 MINUTES Fluoroscopy was utilized by the requesting physician.  No radiographic interpretation.   Ir Int Lianne Cure Biliary Drain With Cholangiogram  Result Date: 11/03/2015 INDICATION: 75 year old with obstructive jaundice. CT imaging demonstrates an obstruction in the common bile duct. Unclear if this is related to a stone or tumor. Unfortunately, biliary system could not be decompressed or further evaluated from an endoscopic approach. Patient needs biliary decompression and diagnostic imaging of the biliary system. EXAM: PERCUTANEOUS TRANSHEPATIC CHOLANGIOGRAM WITH ULTRASOUND AND FLUOROSCOPIC GUIDANCE PLACEMENT OF INTERNAL/ EXTERNAL BILIARY DRAIN MEDICATIONS: Unasyn 3 g; The antibiotic was administered within an appropriate time frame prior to the initiation of the procedure. ANESTHESIA/SEDATION: Moderate (conscious) sedation was employed during this procedure. A total of Versed 2.0 mg and Fentanyl 75 mcg was administered intravenously. Moderate Sedation Time: 35 minutes. The patient's level of consciousness and vital signs were monitored continuously by radiology nursing throughout the procedure under my direct supervision. FLUOROSCOPY TIME:  Fluoroscopy Time: 8 minutes, 48 mGy COMPLICATIONS: None immediate. PROCEDURE: Informed written consent was obtained from the patient after a thorough discussion of the procedural risks, benefits and alternatives. All questions  were addressed. Maximal Sterile Barrier Technique was utilized including caps, mask, sterile gowns, sterile gloves, sterile drape, hand hygiene and skin antiseptic. A timeout was performed prior to the initiation of the procedure. Patient  was placed supine on the interventional table. The anterior abdomen was prepped and draped in a sterile fashion. Dilated intrahepatic ducts identified with ultrasound. Skin was anesthetized with 1% lidocaine. Using ultrasound guidance, a 22 gauge needle was directed into a dilated intrahepatic duct in the left hepatic lobe. Contrast injection confirmed placement in the biliary system. Wire would not advance from this access due to excessive patient breathing. Needle was redirected slightly more central at a different angle. Contrast was again injected and confirmed placement in the biliary system. A wire was successfully advanced into the central biliary system. Tract was dilated with an Accustick dilator set. A 5 [place] Kumpe the catheter was advanced beyond the common bile duct obstruction using a Glidewire. Glidewire and catheter were advanced into the duodenum. Tract was dilated to accommodate a 10 [place] biliary drain. The distal aspect was advanced into the duodenum. Biliary sample was sent for cytology and culture. Small amount of contrast was injected to confirm placement. Catheter was sutured to skin. Dressing placed over the catheter. FINDINGS: Severe intrahepatic and extrahepatic biliary dilatation. Access obtained from a dilated branch in the lateral left hepatic lobe. At least 3 large intraluminal filling defects compatible with stones. There are additional smaller stones in the common bile duct and dilated cystic duct. Drainage catheter was successfully advanced into the duodenum. IMPRESSION: Successful percutaneous transhepatic cholangiogram and placement of internal/external biliary drain. Drain was placed from a left hepatic approach. Multiple large filling defects in the extrahepatic biliary system are compatible with stones. PLAN: Follow drain output and lab values. Will need to coordinate stone management with GI. Electronically Signed   By: Markus Daft M.D.   On: 11/03/2015 19:23     Labs:  CBC:  Recent Labs  11/01/15 1040 11/02/15 0815 11/04/15 0439 11/05/15 0808  WBC 11.1* 16.0* 9.8 8.9  HGB 11.1* 11.2* 8.7* 8.6*  HCT 33.7* 33.3* 26.2* 26.1*  PLT 191 224 222 259    COAGS:  Recent Labs  11/03/15 0751  INR 1.10  APTT 26    BMP:  Recent Labs  11/01/15 1040 11/02/15 0600 11/04/15 1013 11/05/15 0808  NA 135 132* 133* 132*  K 3.1* 3.5 3.6 3.3*  CL 102 100* 102 100*  CO2 '25 25 25 25  '$ GLUCOSE 116* 149* 96 107*  BUN '6 6 11 '$ <5*  CALCIUM 8.0* 7.7* 7.9* 7.8*  CREATININE 0.49 0.45 0.59 0.53  GFRNONAA >60 >60 >60 >60  GFRAA >60 >60 >60 >60    LIVER FUNCTION TESTS:  Recent Labs  11/01/15 1040 11/02/15 0600 11/04/15 0439 11/05/15 0808  BILITOT 7.4* 7.1* 4.9* 2.8*  AST 81* 86* 30 21  ALT 142* 129* 77* 57*  ALKPHOS 257* 257* 160* 147*  PROT 5.6* 5.5* 4.4* 4.8*  ALBUMIN 2.3* 2.0* 1.7* 1.8*    Assessment and Plan: 1, s/p PTC, I/E drain on 8/10 -LFTs cont to trend down -drain draining well -will follow. -ERCP on Monday per GI  Electronically Signed: Albert Devaul E 11/05/2015, 11:18 AM   I spent a total of 15 Minutes at the the patient's bedside AND on the patient's hospital floor or unit, greater than 50% of which was counseling/coordinating care for obstructive jaundice

## 2015-11-06 LAB — COMPREHENSIVE METABOLIC PANEL
ALT: 46 U/L (ref 14–54)
AST: 19 U/L (ref 15–41)
Albumin: 2 g/dL — ABNORMAL LOW (ref 3.5–5.0)
Alkaline Phosphatase: 134 U/L — ABNORMAL HIGH (ref 38–126)
Anion gap: 10 (ref 5–15)
BILIRUBIN TOTAL: 2.4 mg/dL — AB (ref 0.3–1.2)
CHLORIDE: 98 mmol/L — AB (ref 101–111)
CO2: 26 mmol/L (ref 22–32)
CREATININE: 0.58 mg/dL (ref 0.44–1.00)
Calcium: 8 mg/dL — ABNORMAL LOW (ref 8.9–10.3)
GFR calc Af Amer: >60 mL/min (ref >60)
GLUCOSE: 94 mg/dL (ref 65–99)
Potassium: 3.3 mmol/L — ABNORMAL LOW (ref 3.5–5.1)
Sodium: 134 mmol/L — ABNORMAL LOW (ref 135–145)
Total Protein: 5.1 g/dL — ABNORMAL LOW (ref 6.5–8.1)

## 2015-11-06 LAB — CBC
HCT: 27 % — ABNORMAL LOW (ref 36.0–46.0)
Hemoglobin: 9 g/dL — ABNORMAL LOW (ref 12.0–15.0)
MCH: 28.5 pg (ref 26.0–34.0)
MCHC: 33.3 g/dL (ref 30.0–36.0)
MCV: 85.4 fL (ref 78.0–100.0)
PLATELETS: 307 10*3/uL (ref 150–400)
RBC: 3.16 MIL/uL — AB (ref 3.87–5.11)
RDW: 14.6 % (ref 11.5–15.5)
WBC: 6.9 10*3/uL (ref 4.0–10.5)

## 2015-11-06 LAB — PHOSPHORUS: PHOSPHORUS: 4.1 mg/dL (ref 2.5–4.6)

## 2015-11-06 LAB — MAGNESIUM: MAGNESIUM: 2.2 mg/dL (ref 1.7–2.4)

## 2015-11-06 MED ORDER — POTASSIUM CHLORIDE CRYS ER 20 MEQ PO TBCR
40.0000 meq | EXTENDED_RELEASE_TABLET | Freq: Once | ORAL | Status: AC
  Administered 2015-11-06: 40 meq via ORAL
  Filled 2015-11-06: qty 2

## 2015-11-06 NOTE — Progress Notes (Signed)
PROGRESS NOTE    Amy Sheppard  ZOX:[id number] DOB: [birth year] DOA: 10/30/2015 PCP: Gildardo Griffes, MD    Brief Narrative:  Amy Sheppard a 75 y.o.femalewith medical history significant of hypertension who presents with 5 day complaint of poor oral intake. Started on Wednesday night when patient had nausea and emesis. Has felt weak since. Has had poor oral intake as well. At first she thought it was something she ate Wednesday but the problem has persisted. Given her progressive weakness she decided to come to the hospital for further evaluation recommendations. Nothing she is aware of makes it better eating makes it worse.The problem occurred insidiously.   Assessment & Plan:   Principal Problem:   Obstructive jaundice Active Problems:   Calculus of bile duct with obstruction and without cholangitis or cholecystitis   Common bile duct dilatation   Transaminitis   Hypophosphatemia   Hypokalemia   Wheezing   Essential hypertension   Dehydration   SOB (shortness of breath)   #1 obstructive jaundice secondary to common bile duct stones/choledocholithiasis Patient had presented with nausea, vomiting noted to be jaundiced. Patient noted to have a transaminitis. CT abdomen and pelvis with an obstructing hyperdense mass versus stone in the common bile duct measuring 1.2 cm causing market CBD dilatation and intrahepatic bile duct dilatation. LFTs trending down. Patient has been assessed by GI and status post failed ERCP with balloon dilatation of distal duodenum and diagnostic endoscopic procedure 11/02/2015. Patient was seen in consultation by interventional radiology and subsequently underwent percutaneous transhepatic cholangiogram with left internal/external biliary drain and fluid sent for culture and cytology. Findings on PT cholangiogram did show severe biliary dilatation with multiple large filling defects in the, bile duct consistent with stones. Patient tolerating full liquid diet.  Culture results from drain positive for Klebsiella pneumonia sensitive to Unasyn. Continue Unasyn. CT abdomen and pelvis on admission did show gallbladder distention containing small amount of layering stones and/or sludge. GI and IR following and appreciate input and recommendations. Patient for repeat ERCP on Monday per GI.?? Elective cholecystectomy once patient over acute illness. Continue IV Unasyn.   #2 hypokalemia/hypophosphatemia Likely secondary to GI losses. Replete.   #3 transaminitis Likely secondary to problem #1. LFTs trending down. Bilirubin trending back down. Patient s/p failed ERCP 11/02/2015 with balloon dilatation of distal duodenum and diagnostic endoscopy procedure. Patient underwent successful percutaneous transhepatic cholangiogram with internal/external biliary drain placement. Interventional radiology. Culture results from drain positive for Klebsiella pneumonia which is sensitive to Unasyn. Monitor LFTs closely. Continue empiric IV antibiotics. Per GI patient for probable ERCP on Monday. GI and IR following and appreciate input and recommendations.   #4 wheezing Patient noted to be wheezing on examination 11/01/15, which has since resolved with a dose of IV Solu-Medrol, scheduled nebs, Pulmicort and Brovana. Follow.  #5 leukocytosis Patient with a worsening leukocytosis with a white count of 16,000 (11/02/2015) from 11,000. Leukocytosis improved and white count currently at 6.9. Likely secondary to Klebsiella pneumonia which grew out of drain culture and IV Solu-Medrol dose which was given on 11/01/2015. Patient currently on IV Unasyn. Follow.  #6 hypertension Continue home regimen of atenolol and Norvasc.   DVT prophylaxis: SCDs Code Status: Full Family Communication: Updated patient and family at bedside. Disposition Plan: Home when workup is complete a medically stable, and per GI   Consultants:   Gastroenterology: Dr. Evette Cristal 10/31/2015  Interventional radiology:  Dr. Lowella Dandy 11/03/2015  Procedures:   CT abdomen and pelvis 10/30/2015  Chest x-ray 11/01/2015  Unsuccessful ERCP 11/02/2015  per Dr.Magod  Percutaneous chance hepatic cholangiogram per Dr. Lowella DandyHenn 05/06/2015  Antimicrobials:   IV Unasyn 10/31/2015   Subjective: Patient denies any chest. No shortness of breath. Patient states loose stools have improved on Questran. Patient denies any abdominal pain. Patient sitting up in chair by the window.   Objective: Vitals:   11/05/15 1958 11/05/15 2145 11/06/15 0537 11/06/15 1053  BP:  136/75 (!) 151/75   Pulse:  82 80   Resp:  17 20   Temp:  98.4 F (36.9 C) 98.5 F (36.9 C)   TempSrc:  Oral Oral   SpO2: 97% 97% 95% 95%  Weight:      Height:        Intake/Output Summary (Last 24 hours) at 11/06/15 1236 Last data filed at 11/06/15 1100  Gross per 24 hour  Intake             1660 ml  Output             5600 ml  Net            -3940 ml   Filed Weights   10/30/15 0927 10/30/15 1757 11/02/15 1400  Weight: 77.1 kg (170 lb) 81.2 kg (179 lb) 81.2 kg (179 lb)    Examination:  General exam: Appears calm and comfortable.Less jaundiced. Respiratory system: CTAB. Respiratory effort normal. Cardiovascular system: S1 & S2 heard, RRR. No JVD, murmurs, rubs, gallops or clicks. No pedal edema. Gastrointestinal system: Abdomen is nondistended, soft and nontender. No organomegaly or masses felt. Normal bowel sounds heard. Drain with biliary drainage noted. Central nervous system: Alert and oriented. No focal neurological deficits. Extremities: Symmetric 5 x 5 power. Skin: No rashes, lesions or ulcers Psychiatry: Judgement and insight appear normal. Mood & affect appropriate.     Data Reviewed: I have personally reviewed following labs and imaging studies  CBC:  Recent Labs Lab 11/01/15 1040 11/02/15 0815 11/04/15 0439 11/05/15 0808 11/06/15 0551  WBC 11.1* 16.0* 9.8 8.9 6.9  NEUTROABS 9.7* 14.1* 8.1*  --   --   HGB 11.1* 11.2* 8.7*  8.6* 9.0*  HCT 33.7* 33.3* 26.2* 26.1* 27.0*  MCV 86.9 86.9 87.6 85.6 85.4  PLT 191 224 222 259 307   Basic Metabolic Panel:  Recent Labs Lab 10/30/15 1819  11/01/15 1040 11/02/15 0600 11/04/15 0439 11/04/15 1013 11/05/15 0808 11/06/15 0551  NA  --   < > 135 132*  --  133* 132* 134*  K  --   < > 3.1* 3.5  --  3.6 3.3* 3.3*  CL  --   < > 102 100*  --  102 100* 98*  CO2  --   < > 25 25  --  25 25 26   GLUCOSE  --   < > 116* 149*  --  96 107* 94  BUN  --   < > 6 6  --  11 <5* <5*  CREATININE 0.67  < > 0.49 0.45  --  0.59 0.53 0.58  CALCIUM  --   < > 8.0* 7.7*  --  7.9* 7.8* 8.0*  MG 2.2  --  2.1 2.0 2.0  --  1.8 2.2  PHOS 2.0*  --  1.3* 2.0*  --   --  3.6 4.1  < > = values in this interval not displayed. GFR: Estimated Creatinine Clearance: 61.8 mL/min (by C-G formula based on SCr of 0.8 mg/dL). Liver Function Tests:  Recent Labs Lab 11/01/15 1040 11/02/15 0600 11/04/15 0439 11/05/15  9562 11/06/15 0551  AST 81* 86* 30 21 19   ALT 142* 129* 77* 57* 46  ALKPHOS 257* 257* 160* 147* 134*  BILITOT 7.4* 7.1* 4.9* 2.8* 2.4*  PROT 5.6* 5.5* 4.4* 4.8* 5.1*  ALBUMIN 2.3* 2.0* 1.7* 1.8* 2.0*   No results for input(s): LIPASE, AMYLASE in the last 168 hours. No results for input(s): AMMONIA in the last 168 hours. Coagulation Profile:  Recent Labs Lab 11/03/15 0751  INR 1.10   Cardiac Enzymes: No results for input(s): CKTOTAL, CKMB, CKMBINDEX, TROPONINI in the last 168 hours. BNP (last 3 results) No results for input(s): PROBNP in the last 8760 hours. HbA1C: No results for input(s): HGBA1C in the last 72 hours. CBG: No results for input(s): GLUCAP in the last 168 hours. Lipid Profile: No results for input(s): CHOL, HDL, LDLCALC, TRIG, CHOLHDL, LDLDIRECT in the last 72 hours. Thyroid Function Tests: No results for input(s): TSH, T4TOTAL, FREET4, T3FREE, THYROIDAB in the last 72 hours. Anemia Panel:  Recent Labs  11/04/15 1013  VITAMINB12 784  FOLATE 24.6  FERRITIN  186  TIBC 202*  IRON 59  RETICCTPCT 1.3   Sepsis Labs: No results for input(s): PROCALCITON, LATICACIDVEN in the last 168 hours.  Recent Results (from the past 240 hour(s))  Culture, blood (routine x 2)     Status: None (Preliminary result)   Collection Time: 10/31/15  6:40 AM  Result Value Ref Range Status   Specimen Description BLOOD LEFT HAND  Final   Special Requests IN PEDIATRIC BOTTLE 0.1CC  Final   Culture  Setup Time   Final    GRAM VARIABLE COCCOBACILLI AEROBIC BOTTLE ONLY CRITICAL RESULT CALLED TO, READ BACK BY AND VERIFIED WITH: C BALL,PHARMD AT 1301 11/05/15 BY L BENFIELD    Culture CULTURE REINCUBATED FOR BETTER GROWTH  Final   Report Status PENDING  Incomplete  Blood Culture ID Panel (Reflexed)     Status: None   Collection Time: 10/31/15  6:40 AM  Result Value Ref Range Status   Enterococcus species NOT DETECTED NOT DETECTED Final   Vancomycin resistance NOT DETECTED NOT DETECTED Final   Listeria monocytogenes NOT DETECTED NOT DETECTED Final   Staphylococcus species NOT DETECTED NOT DETECTED Final   Staphylococcus aureus NOT DETECTED NOT DETECTED Final   Methicillin resistance NOT DETECTED NOT DETECTED Final   Streptococcus species NOT DETECTED NOT DETECTED Final   Streptococcus agalactiae NOT DETECTED NOT DETECTED Final   Streptococcus pneumoniae NOT DETECTED NOT DETECTED Final   Streptococcus pyogenes NOT DETECTED NOT DETECTED Final   Acinetobacter baumannii NOT DETECTED NOT DETECTED Final   Enterobacteriaceae species NOT DETECTED NOT DETECTED Final   Enterobacter cloacae complex NOT DETECTED NOT DETECTED Final   Escherichia coli NOT DETECTED NOT DETECTED Final   Klebsiella oxytoca NOT DETECTED NOT DETECTED Final   Klebsiella pneumoniae NOT DETECTED NOT DETECTED Final   Proteus species NOT DETECTED NOT DETECTED Final   Serratia marcescens NOT DETECTED NOT DETECTED Final   Carbapenem resistance NOT DETECTED NOT DETECTED Final   Haemophilus influenzae NOT  DETECTED NOT DETECTED Final   Neisseria meningitidis NOT DETECTED NOT DETECTED Final   Pseudomonas aeruginosa NOT DETECTED NOT DETECTED Final   Candida albicans NOT DETECTED NOT DETECTED Final   Candida glabrata NOT DETECTED NOT DETECTED Final   Candida krusei NOT DETECTED NOT DETECTED Final   Candida parapsilosis NOT DETECTED NOT DETECTED Final   Candida tropicalis NOT DETECTED NOT DETECTED Final  Culture, blood (routine x 2)     Status: None  Collection Time: 10/31/15  6:53 AM  Result Value Ref Range Status   Specimen Description BLOOD LEFT HAND  Final   Special Requests IN PEDIATRIC BOTTLE 0.2CC  Final   Culture NO GROWTH 5 DAYS  Final   Report Status 11/05/2015 FINAL  Final  Culture, body fluid-bottle     Status: Abnormal (Preliminary result)   Collection Time: 11/03/15  2:55 PM  Result Value Ref Range Status   Specimen Description FLUID BILE  Final   Special Requests BOTTLES DRAWN AEROBIC AND ANAEROBIC 10CC  Final   Gram Stain   Final    GRAM VARIABLE ROD IN BOTH AEROBIC AND ANAEROBIC BOTTLES BOTTLE GROWTH CONSISTENT WITH ORIGINAL GRAM STAIN RESULTS    Culture KLEBSIELLA PNEUMONIAE (A)  Final   Report Status PENDING  Incomplete   Organism ID, Bacteria KLEBSIELLA PNEUMONIAE  Final      Susceptibility   Klebsiella pneumoniae - MIC*    AMPICILLIN 16 RESISTANT Resistant     CEFAZOLIN <=4 SENSITIVE Sensitive     CEFEPIME <=1 SENSITIVE Sensitive     CEFTAZIDIME <=1 SENSITIVE Sensitive     CEFTRIAXONE <=1 SENSITIVE Sensitive     CIPROFLOXACIN <=0.25 SENSITIVE Sensitive     GENTAMICIN <=1 SENSITIVE Sensitive     IMIPENEM <=0.25 SENSITIVE Sensitive     TRIMETH/SULFA <=20 SENSITIVE Sensitive     AMPICILLIN/SULBACTAM 4 SENSITIVE Sensitive     PIP/TAZO 8 SENSITIVE Sensitive     Extended ESBL NEGATIVE Sensitive     * KLEBSIELLA PNEUMONIAE  Gram stain     Status: None   Collection Time: 11/03/15  2:55 PM  Result Value Ref Range Status   Specimen Description FLUID BILE  Final    Special Requests NONE  Final   Gram Stain   Final    FEW WBC PRESENT, PREDOMINANTLY PMN MODERATE GRAM NEGATIVE RODS FEW GRAM POSITIVE RODS    Report Status 11/03/2015 FINAL  Final         Radiology Studies: No results found.      Scheduled Meds: . amLODipine  5 mg Oral Daily  . ampicillin-sulbactam (UNASYN) IV  3 g Intravenous Q6H  . arformoterol  15 mcg Nebulization BID  . aspirin EC  81 mg Oral QHS  . atenolol  25 mg Oral Daily  . budesonide (PULMICORT) nebulizer solution  0.25 mg Nebulization BID  . cholestyramine  4 g Oral BID  . gabapentin  300 mg Oral BID  . pantoprazole  40 mg Oral Daily  . potassium chloride  10 mEq Oral Daily  . sodium phosphate  Dextrose 5% IVPB  15 mmol Intravenous Once   Continuous Infusions:     LOS: 7 days    Time spent: 35 minutes    Andrae Claunch, MD [facility] Pager [phone]  If 7PM-7AM, please contact night-coverage [email or web address] Password [facility] 11/06/2015, 12:36 PM

## 2015-11-06 NOTE — Progress Notes (Signed)
[facility]  Charlesetta ShanksGlenda Fromm 75 y.o. 07-May-1940   Subjective: Patient doing better. T Bili improving. Denied fever, chills.   Objective: Vital signs in last 24 hours: Vitals:   11/05/15 2145 11/06/15 0537  BP: 136/75 (!) 151/75  Pulse: 82 80  Resp: 17 20  Temp: 98.4 F (36.9 C) 98.5 F (36.9 C)    Physical Exam:  General:  Alert, cooperative,   Head:  Normocephalic, without obvious abnormality, atraumatic  Eyes:  , EOM's intact, mild icterus   Lungs:   Decreased breath sounds   Heart:  Regular rate and rhythm, S1, S2 normal  Abdomen:   Soft,non-tender, bowel sounds active all four quadrants,  no masses,   Extremities: 1 + edema     Lab Results:  Recent Labs  11/05/15 0808 11/06/15 0551  NA 132* 134*  K 3.3* 3.3*  CL 100* 98*  CO2 25 26  GLUCOSE 107* 94  BUN <5* <5*  CREATININE 0.53 0.58  CALCIUM 7.8* 8.0*  MG 1.8 2.2  PHOS 3.6 4.1    Recent Labs  11/05/15 0808 11/06/15 0551  AST 21 19  ALT 57* 46  ALKPHOS 147* 134*  BILITOT 2.8* 2.4*  PROT 4.8* 5.1*  ALBUMIN 1.8* 2.0*    Recent Labs  11/04/15 0439 11/05/15 0808 11/06/15 0551  WBC 9.8 8.9 6.9  NEUTROABS 8.1*  --   --   HGB 8.7* 8.6* 9.0*  HCT 26.2* 26.1* 27.0*  MCV 87.6 85.6 85.4  PLT 222 259 307   No results for input(s): LABPROT, INR in the last 72 hours.    Assessment/Plan: - Obstructive Jaundice . Now with Internal and External biliary drain . T Bili improving  -  Obstructing hyperdense mass versus stone in the common bile duct, measuring approximately        1.2 cm greatest dimension  PLAN ------- - Monitor LFts  - Possible ERCP on Monday /Tuesday  - Continue Abx for now.    Garcia Dalzell 11/06/2015, 10:20 AM  Pager [phone]  If no answer or after 5 PM call [phone]

## 2015-11-07 LAB — COMPREHENSIVE METABOLIC PANEL
ALK PHOS: 139 U/L — AB (ref 38–126)
ALT: 45 U/L (ref 14–54)
ANION GAP: 8 (ref 5–15)
AST: 22 U/L (ref 15–41)
Albumin: 2.2 g/dL — ABNORMAL LOW (ref 3.5–5.0)
BUN: <5 mg/dL — ABNORMAL LOW (ref 6–20)
CALCIUM: 8.4 mg/dL — AB (ref 8.9–10.3)
CO2: 26 mmol/L (ref 22–32)
Chloride: 100 mmol/L — ABNORMAL LOW (ref 101–111)
Creatinine, Ser: 0.53 mg/dL (ref 0.44–1.00)
GFR calc non Af Amer: >60 mL/min (ref >60)
Glucose, Bld: 93 mg/dL (ref 65–99)
POTASSIUM: 3.7 mmol/L (ref 3.5–5.1)
SODIUM: 134 mmol/L — AB (ref 135–145)
Total Bilirubin: 2.2 mg/dL — ABNORMAL HIGH (ref 0.3–1.2)
Total Protein: 5.5 g/dL — ABNORMAL LOW (ref 6.5–8.1)

## 2015-11-07 LAB — CBC WITH DIFFERENTIAL/PLATELET
BASOS ABS: 0.1 10*3/uL (ref 0.0–0.1)
BASOS PCT: 1 %
Eosinophils Absolute: 0.2 10*3/uL (ref 0.0–0.7)
Eosinophils Relative: 3 %
HEMATOCRIT: 30.2 % — AB (ref 36.0–46.0)
HEMOGLOBIN: 9.7 g/dL — AB (ref 12.0–15.0)
Lymphocytes Relative: 13 %
Lymphs Abs: 0.9 10*3/uL (ref 0.7–4.0)
MCH: 28.4 pg (ref 26.0–34.0)
MCHC: 32.1 g/dL (ref 30.0–36.0)
MCV: 88.6 fL (ref 78.0–100.0)
MONOS PCT: 9 %
Monocytes Absolute: 0.6 10*3/uL (ref 0.1–1.0)
NEUTROS ABS: 5.1 10*3/uL (ref 1.7–7.7)
NEUTROS PCT: 74 %
Platelets: 346 10*3/uL (ref 150–400)
RBC: 3.41 MIL/uL — ABNORMAL LOW (ref 3.87–5.11)
RDW: 15.2 % (ref 11.5–15.5)
WBC: 6.9 10*3/uL (ref 4.0–10.5)

## 2015-11-07 LAB — PHOSPHORUS: PHOSPHORUS: 3.8 mg/dL (ref 2.5–4.6)

## 2015-11-07 LAB — MAGNESIUM: Magnesium: 2.1 mg/dL (ref 1.7–2.4)

## 2015-11-07 NOTE — Progress Notes (Signed)
Subjective: No abdominal pain. Ready to get her procedure done.  Objective: Vital signs in last 24 hours: Temp:  [97.7 F (36.5 C)-98.6 F (37 C)] 98.3 F (36.8 C) (08/14 1326) Pulse Rate:  [71-86] 79 (08/14 1326) Resp:  [16-22] 22 (08/14 1326) BP: (130-150)/(67-76) 130/76 (08/14 1326) SpO2:  [96 %-98 %] 97 % (08/14 1326) Weight change:  Last BM Date: 11/06/15  PE: GEN:  NAD ABD:  Percutaneous biliary drain in place, soft non-tender  Lab Results: CBC    Component Value Date/Time   WBC 6.9 11/07/2015 0624   RBC 3.41 (L) 11/07/2015 0624   HGB 9.7 (L) 11/07/2015 0624   HCT 30.2 (L) 11/07/2015 0624   PLT 346 11/07/2015 0624   MCV 88.6 11/07/2015 0624   MCH 28.4 11/07/2015 0624   MCHC 32.1 11/07/2015 0624   RDW 15.2 11/07/2015 0624   LYMPHSABS 0.9 11/07/2015 0624   MONOABS 0.6 11/07/2015 0624   EOSABS 0.2 11/07/2015 0624   BASOSABS 0.1 11/07/2015 0624   CMP     Component Value Date/Time   NA 134 (L) 11/07/2015 0624   K 3.7 11/07/2015 0624   CL 100 (L) 11/07/2015 0624   CO2 26 11/07/2015 0624   GLUCOSE 93 11/07/2015 0624   BUN <5 (L) 11/07/2015 0624   CREATININE 0.53 11/07/2015 0624   CALCIUM 8.4 (L) 11/07/2015 0624   PROT 5.5 (L) 11/07/2015 0624   ALBUMIN 2.2 (L) 11/07/2015 0624   AST 22 11/07/2015 0624   ALT 45 11/07/2015 0624   ALKPHOS 139 (H) 11/07/2015 0624   BILITOT 2.2 (H) 11/07/2015 0624   GFRNONAA >60 11/07/2015 0624   GFRAA >60 11/07/2015 [id number]   Assessment:  1.  Common bile duct stones, initial failed ERCP, now with percutaneous biliary in/out catheter. 2.  Elevated LFTs, downtrending since percutaneous biliary drain.  Plan:  1.  ERCP with Dr. Ewing SchleinMagod tomorrow, for hopeful biliary sphincterotomy and stone extraction. 2.  NPO after midnight. 3.  [facility] will follow.   Amy Sheppard,Amy Sheppard 11/07/2015, 1:53 PM   Pager [phone] If no answer or after 5 PM call [phone]

## 2015-11-07 NOTE — Progress Notes (Signed)
Physical Therapy Treatment Patient Details Name: Amy Sheppard MRN: [id number] DOB: 1941/03/07 Today's Date: 11/07/2015    History of Present Illness This 75 year old female was admitted with nausea and poor intake and found to have obstructive jaundice. She is s/p biliary drain.  H/O HTN    PT Comments    Patient continues to be unsteady and required min guard/min A for safe ambulation with pt holding onto rail in hallway at times. Scored 13 on DGI indicating high risk for falls. Review HEP next session.   Follow Up Recommendations  No PT follow up;Supervision - Intermittent;Supervision for mobility/OOB     Equipment Recommendations  None recommended by PT    Recommendations for Other Services       Precautions / Restrictions Precautions Precautions: Fall Restrictions Weight Bearing Restrictions: No    Mobility  Bed Mobility Overal bed mobility: Independent                Transfers Overall transfer level: Needs assistance Equipment used: None   Sit to Stand: Supervision         General transfer comment: cues for safety  Ambulation/Gait Ambulation/Gait assistance: Min guard;Min assist Ambulation Distance (Feet): 250 Feet Assistive device: None (held onto rail in hallway at times) Gait Pattern/deviations: Step-through pattern;Decreased stride length;Staggering left;Staggering right     General Gait Details: grossly needed min guard and min A to recover from LOB X1 when pt began staggering to L side   Stairs Stairs: Yes Stairs assistance: Min guard Stair Management: One rail Right;Forwards;Sideways Number of Stairs: 3 General stair comments: cues for safety and step to pattern; forward ascending and sideways to descend  Wheelchair Mobility    Modified Rankin (Stroke Patients Only)       Balance                                    Cognition Arousal/Alertness: Awake/alert Behavior During Therapy: WFL for tasks  assessed/performed Overall Cognitive Status: Within Functional Limits for tasks assessed                      Exercises      General Comments        Pertinent Vitals/Pain Pain Assessment: Faces Faces Pain Scale: No hurt Pain Intervention(s): Monitored during session    Home Living                      Prior Function            PT Goals (current goals can now be found in the care plan section) Acute Rehab PT Goals Patient Stated Goal: home PT Goal Formulation: With patient Time For Goal Achievement: 11/11/15 Potential to Achieve Goals: Good Progress towards PT goals: Progressing toward goals    Frequency  Min 3X/week    PT Plan Current plan remains appropriate    Co-evaluation             End of Session Equipment Utilized During Treatment: Gait belt Activity Tolerance: Patient tolerated treatment well Patient left: with family/visitor present;with call bell/phone within reach;in bed     Time: [id number] PT Time Calculation (min) (ACUTE ONLY): 17 min  Charges:  $Gait Training: 8-22 mins                    G Codes:      Hope PigeonKellyn R Karey Stucki  Ladona Ridgelaylor, PTA Pager: [phone]   11/07/2015, 2:50 PM

## 2015-11-07 NOTE — Progress Notes (Signed)
Pharmacy Antibiotic Note  Amy ShanksGlenda Sheppard is a 75 y.o. female admitted on 10/30/2015 with nausea and poor oral intake. Continues on Unasyn for intra-abdominal infection. CT scan reveals stone vs mass in the CBD. Tentative plan for repeat ERCP with stone extraction 8/15. Remains afeb, WBC now wnl, SCr stable.  Plan: Continue Unasyn 3 g IV q6h Monitor renal function, c/s, clinical progress, LOT F/u repeat ERCP Tues  Height: 5\' 2"  (157.5 cm) Weight: 179 lb (81.2 kg) IBW/kg (Calculated) : 50.1  Temp (24hrs), Avg:98.5 F (36.9 C), Min:98.3 F (36.8 C), Max:98.6 F (37 C)   Recent Labs Lab 11/02/15 0600 11/02/15 0815 11/04/15 0439 11/04/15 1013 11/05/15 0808 11/06/15 0551 11/07/15 0624  WBC  --  16.0* 9.8  --  8.9 6.9 6.9  CREATININE 0.45  --   --  0.59 0.53 0.58 0.53    Estimated Creatinine Clearance: 61.8 mL/min (by C-G formula based on SCr of 0.8 mg/dL).    Allergies  Allergen Reactions  . Tetanus Toxoids Swelling    WELTS    Antimicrobials this admission: Unasyn 8/7 >>   Microbiology results: 8/7 BCx: 1/2 gm variable coccobacilli 8/7 BCID -negative 8/10 bile cx: Klebsiella (R-Amp, Sens to all others tested)  Thank you for allowing pharmacy to be a part of this patient's care.  Sherle Poeob Vincent, PharmD Clinical Pharmacist 2:34 PM, 11/07/2015

## 2015-11-07 NOTE — Care Management Important Message (Signed)
Important Message  Patient Details  Name: Amy Sheppard MRN: [id number] Date of Birth: 1940/10/04   Medicare Important Message Given:  Yes    Kyla BalzarineShealy, Prestyn Stanco Abena 11/07/2015, 10:53 AM

## 2015-11-07 NOTE — Progress Notes (Signed)
PROGRESS NOTE    Amy ShanksGlenda Sheppard  ZOX:[id number] DOB: Dec 07, 1940 DOA: 10/30/2015 PCP: Gildardo GriffesBURNS,KEVIN, MD    Brief Narrative:  Amy AsalGlenda Bellamyis a 75 y.o.femalewith medical history significant of hypertension who presents with 5 day complaint of poor oral intake. Started on Wednesday night when patient had nausea and emesis. Has felt weak since. Has had poor oral intake as well. At first she thought it was something she ate Wednesday but the problem has persisted. Given her progressive weakness she decided to come to the hospital for further evaluation recommendations. Nothing she is aware of makes it better eating makes it worse.The problem occurred insidiously.   Assessment & Plan:   Principal Problem:   Obstructive jaundice Active Problems:   Calculus of bile duct with obstruction and without cholangitis or cholecystitis   Common bile duct dilatation   Transaminitis   Hypophosphatemia   Hypokalemia   Wheezing   Essential hypertension   Dehydration   SOB (shortness of breath)   #1 obstructive jaundice secondary to common bile duct stones/choledocholithiasis Patient had presented with nausea, vomiting noted to be jaundiced. Patient noted to have a transaminitis. CT abdomen and pelvis with an obstructing hyperdense mass versus stone in the common bile duct measuring 1.2 cm causing market CBD dilatation and intrahepatic bile duct dilatation. LFTs trending down. Patient has been assessed by GI and status post failed ERCP with balloon dilatation of distal duodenum and diagnostic endoscopic procedure 11/02/2015. Patient was seen in consultation by interventional radiology and subsequently underwent percutaneous transhepatic cholangiogram with left internal/external biliary drain and fluid sent for culture and cytology. Findings on PT cholangiogram did show severe biliary dilatation with multiple large filling defects in the, bile duct consistent with stones. Patient tolerating full liquid diet.  Culture results from drain positive for Klebsiella pneumonia sensitive to Unasyn. Continue Unasyn. CT abdomen and pelvis on admission did show gallbladder distention containing small amount of layering stones and/or sludge. GI and IR following and appreciate input and recommendations. Patient for repeat ERCP early this week per GI.?? Elective cholecystectomy once patient over acute illness. Continue IV Unasyn.   #2 hypokalemia/hypophosphatemia Likely secondary to GI losses. Replete.   #3 transaminitis Likely secondary to problem #1. LFTs trending down. Bilirubin trending back down. Patient s/p failed ERCP 11/02/2015 with balloon dilatation of distal duodenum and diagnostic endoscopy procedure. Patient underwent successful percutaneous transhepatic cholangiogram with internal/external biliary drain placement. Interventional radiology. Culture results from drain positive for Klebsiella pneumonia which is sensitive to Unasyn. Monitor LFTs closely. Continue empiric IV antibiotics. Per GI patient for probable ERCP early this week.. GI and IR following and appreciate input and recommendations.   #4 wheezing Patient noted to be wheezing on examination 11/01/15, which has since resolved with a dose of IV Solu-Medrol, scheduled nebs, Pulmicort and Brovana. Follow.  #5 leukocytosis Patient with a worsening leukocytosis with a white count of 16,000 (11/02/2015) from 11,000. Leukocytosis improved and white count currently at 6.9. Likely secondary to Klebsiella pneumonia which grew out of drain culture and IV Solu-Medrol dose which was given on 11/01/2015. Patient currently on IV Unasyn. Follow.  #6 hypertension Continue home regimen of atenolol and Norvasc.   DVT prophylaxis: SCDs Code Status: Full Family Communication: Updated patient. No family at bedside.  Disposition Plan: Home when workup is complete and medically stable, and per GI   Consultants:   Gastroenterology: Dr. Evette CristalGanem  10/31/2015  Interventional radiology: Dr. Lowella DandyHenn 11/03/2015  Procedures:   CT abdomen and pelvis 10/30/2015  Chest x-ray 11/01/2015  Unsuccessful ERCP 11/02/2015 per Dr.Magod  Percutaneous chance hepatic cholangiogram per Dr. Lowella Dandy 05/06/2015  Antimicrobials:   IV Unasyn 10/31/2015   Subjective: Patient sitting in chair by the window. No shortness of breath. No chest pain. Loose stools improved. No abdominal pain. Tolerating full liquids.   Objective: Vitals:   11/06/15 2004 11/06/15 2007 11/06/15 2140 11/07/15 0538  BP:   (!) 150/72 (!) 145/67  Pulse:   81 86  Resp:   16 17  Temp:   98.6 F (37 C) 98.6 F (37 C)  TempSrc:   Oral Oral  SpO2: 97% 98% 97% 96%  Weight:      Height:        Intake/Output Summary (Last 24 hours) at 11/07/15 1123 Last data filed at 11/07/15 1023  Gross per 24 hour  Intake             1056 ml  Output             4425 ml  Net            -3369 ml   Filed Weights   10/30/15 0927 10/30/15 1757 11/02/15 1400  Weight: 77.1 kg (170 lb) 81.2 kg (179 lb) 81.2 kg (179 lb)    Examination:  General exam: Appears calm and comfortable.Less jaundiced. Respiratory system: CTAB. Respiratory effort normal. Cardiovascular system: S1 & S2 heard, RRR. No JVD, murmurs, rubs, gallops or clicks. No pedal edema. Gastrointestinal system: Abdomen is nondistended, soft and nontender. No organomegaly or masses felt. Normal bowel sounds heard. Drain with biliary drainage noted. Central nervous system: Alert and oriented. No focal neurological deficits. Extremities: Symmetric 5 x 5 power. Skin: No rashes, lesions or ulcers Psychiatry: Judgement and insight appear normal. Mood & affect appropriate.     Data Reviewed: I have personally reviewed following labs and imaging studies  CBC:  Recent Labs Lab 11/01/15 1040 11/02/15 0815 11/04/15 0439 11/05/15 0808 11/06/15 0551 11/07/15 0624  WBC 11.1* 16.0* 9.8 8.9 6.9 6.9  NEUTROABS 9.7* 14.1* 8.1*  --   --   5.1  HGB 11.1* 11.2* 8.7* 8.6* 9.0* 9.7*  HCT 33.7* 33.3* 26.2* 26.1* 27.0* 30.2*  MCV 86.9 86.9 87.6 85.6 85.4 88.6  PLT 191 224 222 259 307 346   Basic Metabolic Panel:  Recent Labs Lab 11/01/15 1040 11/02/15 0600 11/04/15 0439 11/04/15 1013 11/05/15 0808 11/06/15 0551 11/07/15 0624  NA 135 132*  --  133* 132* 134* 134*  K 3.1* 3.5  --  3.6 3.3* 3.3* 3.7  CL 102 100*  --  102 100* 98* 100*  CO2 25 25  --  25 25 26 26   GLUCOSE 116* 149*  --  96 107* 94 93  BUN 6 6  --  11 <5* <5* <5*  CREATININE 0.49 0.45  --  0.59 0.53 0.58 0.53  CALCIUM 8.0* 7.7*  --  7.9* 7.8* 8.0* 8.4*  MG 2.1 2.0 2.0  --  1.8 2.2 2.1  PHOS 1.3* 2.0*  --   --  3.6 4.1 3.8   GFR: Estimated Creatinine Clearance: 61.8 mL/min (by C-G formula based on SCr of 0.8 mg/dL). Liver Function Tests:  Recent Labs Lab 11/02/15 0600 11/04/15 0439 11/05/15 0808 11/06/15 0551 11/07/15 0624  AST 86* 30 21 19 22   ALT 129* 77* 57* 46 45  ALKPHOS 257* 160* 147* 134* 139*  BILITOT 7.1* 4.9* 2.8* 2.4* 2.2*  PROT 5.5* 4.4* 4.8* 5.1* 5.5*  ALBUMIN 2.0* 1.7* 1.8* 2.0* 2.2*  No results for input(s): LIPASE, AMYLASE in the last 168 hours. No results for input(s): AMMONIA in the last 168 hours. Coagulation Profile:  Recent Labs Lab 11/03/15 0751  INR 1.10   Cardiac Enzymes: No results for input(s): CKTOTAL, CKMB, CKMBINDEX, TROPONINI in the last 168 hours. BNP (last 3 results) No results for input(s): PROBNP in the last 8760 hours. HbA1C: No results for input(s): HGBA1C in the last 72 hours. CBG: No results for input(s): GLUCAP in the last 168 hours. Lipid Profile: No results for input(s): CHOL, HDL, LDLCALC, TRIG, CHOLHDL, LDLDIRECT in the last 72 hours. Thyroid Function Tests: No results for input(s): TSH, T4TOTAL, FREET4, T3FREE, THYROIDAB in the last 72 hours. Anemia Panel: No results for input(s): VITAMINB12, FOLATE, FERRITIN, TIBC, IRON, RETICCTPCT in the last 72 hours. Sepsis Labs: No results for  input(s): PROCALCITON, LATICACIDVEN in the last 168 hours.  Recent Results (from the past 240 hour(s))  Culture, blood (routine x 2)     Status: None (Preliminary result)   Collection Time: 10/31/15  6:40 AM  Result Value Ref Range Status   Specimen Description BLOOD LEFT HAND  Final   Special Requests IN PEDIATRIC BOTTLE 0.1CC  Final   Culture  Setup Time   Final    GRAM VARIABLE COCCOBACILLI AEROBIC BOTTLE ONLY CRITICAL RESULT CALLED TO, READ BACK BY AND VERIFIED WITH: C BALL,PHARMD AT 1301 11/05/15 BY L BENFIELD    Culture CULTURE REINCUBATED FOR BETTER GROWTH  Final   Report Status PENDING  Incomplete  Blood Culture ID Panel (Reflexed)     Status: None   Collection Time: 10/31/15  6:40 AM  Result Value Ref Range Status   Enterococcus species NOT DETECTED NOT DETECTED Final   Vancomycin resistance NOT DETECTED NOT DETECTED Final   Listeria monocytogenes NOT DETECTED NOT DETECTED Final   Staphylococcus species NOT DETECTED NOT DETECTED Final   Staphylococcus aureus NOT DETECTED NOT DETECTED Final   Methicillin resistance NOT DETECTED NOT DETECTED Final   Streptococcus species NOT DETECTED NOT DETECTED Final   Streptococcus agalactiae NOT DETECTED NOT DETECTED Final   Streptococcus pneumoniae NOT DETECTED NOT DETECTED Final   Streptococcus pyogenes NOT DETECTED NOT DETECTED Final   Acinetobacter baumannii NOT DETECTED NOT DETECTED Final   Enterobacteriaceae species NOT DETECTED NOT DETECTED Final   Enterobacter cloacae complex NOT DETECTED NOT DETECTED Final   Escherichia coli NOT DETECTED NOT DETECTED Final   Klebsiella oxytoca NOT DETECTED NOT DETECTED Final   Klebsiella pneumoniae NOT DETECTED NOT DETECTED Final   Proteus species NOT DETECTED NOT DETECTED Final   Serratia marcescens NOT DETECTED NOT DETECTED Final   Carbapenem resistance NOT DETECTED NOT DETECTED Final   Haemophilus influenzae NOT DETECTED NOT DETECTED Final   Neisseria meningitidis NOT DETECTED NOT DETECTED  Final   Pseudomonas aeruginosa NOT DETECTED NOT DETECTED Final   Candida albicans NOT DETECTED NOT DETECTED Final   Candida glabrata NOT DETECTED NOT DETECTED Final   Candida krusei NOT DETECTED NOT DETECTED Final   Candida parapsilosis NOT DETECTED NOT DETECTED Final   Candida tropicalis NOT DETECTED NOT DETECTED Final  Culture, blood (routine x 2)     Status: None   Collection Time: 10/31/15  6:53 AM  Result Value Ref Range Status   Specimen Description BLOOD LEFT HAND  Final   Special Requests IN PEDIATRIC BOTTLE 0.2CC  Final   Culture NO GROWTH 5 DAYS  Final   Report Status 11/05/2015 FINAL  Final  Culture, body fluid-bottle  Status: Abnormal (Preliminary result)   Collection Time: 11/03/15  2:55 PM  Result Value Ref Range Status   Specimen Description FLUID BILE  Final   Special Requests BOTTLES DRAWN AEROBIC AND ANAEROBIC 10CC  Final   Gram Stain   Final    GRAM VARIABLE ROD IN BOTH AEROBIC AND ANAEROBIC BOTTLES BOTTLE GROWTH CONSISTENT WITH ORIGINAL GRAM STAIN RESULTS    Culture KLEBSIELLA PNEUMONIAE GRAM NEGATIVE RODS  (A)  Final   Report Status PENDING  Incomplete   Organism ID, Bacteria KLEBSIELLA PNEUMONIAE  Final      Susceptibility   Klebsiella pneumoniae - MIC*    AMPICILLIN 16 RESISTANT Resistant     CEFAZOLIN <=4 SENSITIVE Sensitive     CEFEPIME <=1 SENSITIVE Sensitive     CEFTAZIDIME <=1 SENSITIVE Sensitive     CEFTRIAXONE <=1 SENSITIVE Sensitive     CIPROFLOXACIN <=0.25 SENSITIVE Sensitive     GENTAMICIN <=1 SENSITIVE Sensitive     IMIPENEM <=0.25 SENSITIVE Sensitive     TRIMETH/SULFA <=20 SENSITIVE Sensitive     AMPICILLIN/SULBACTAM 4 SENSITIVE Sensitive     PIP/TAZO 8 SENSITIVE Sensitive     Extended ESBL NEGATIVE Sensitive     * KLEBSIELLA PNEUMONIAE  Gram stain     Status: None   Collection Time: 11/03/15  2:55 PM  Result Value Ref Range Status   Specimen Description FLUID BILE  Final   Special Requests NONE  Final   Gram Stain   Final    FEW  WBC PRESENT, PREDOMINANTLY PMN MODERATE GRAM NEGATIVE RODS FEW GRAM POSITIVE RODS    Report Status 11/03/2015 FINAL  Final         Radiology Studies: No results found.      Scheduled Meds: . amLODipine  5 mg Oral Daily  . ampicillin-sulbactam (UNASYN) IV  3 g Intravenous Q6H  . arformoterol  15 mcg Nebulization BID  . aspirin EC  81 mg Oral QHS  . atenolol  25 mg Oral Daily  . budesonide (PULMICORT) nebulizer solution  0.25 mg Nebulization BID  . cholestyramine  4 g Oral BID  . gabapentin  300 mg Oral BID  . pantoprazole  40 mg Oral Daily  . potassium chloride  10 mEq Oral Daily  . sodium phosphate  Dextrose 5% IVPB  15 mmol Intravenous Once   Continuous Infusions:     LOS: 7 days    Time spent: 35 minutes    THOMPSON,DANIEL, MD [facility] Pager [phone]  If 7PM-7AM, please contact night-coverage [email or web address] Password [email or web address] 11/07/2015, 11:23 AM

## 2015-11-07 NOTE — Progress Notes (Signed)
Referring Physician(s): Dr Anson Fret  Supervising Physician: Daryll Brod  Patient Status:  Inpatient  Chief Complaint:  obstructive jaundice secondary stones PTC with int/ext biliary drain placement 8/10   Subjective:  Better daily Biliary drain draining well TB 2.2 today Less jaundiced For ERCP today or Tuesday  Allergies: Tetanus toxoids  Medications: Prior to Admission medications   Medication Sig Start Date End Date Taking? Authorizing Provider  acetaminophen (TYLENOL) 325 MG tablet Take 325 mg by mouth every 6 (six) hours as needed for headache.   Yes Historical Provider, MD  aspirin EC 81 MG tablet Take 81 mg by mouth at bedtime.   Yes Historical Provider, MD  atenolol (TENORMIN) 25 MG tablet Take 25 mg by mouth daily.   Yes Historical Provider, MD  gabapentin (NEURONTIN) 300 MG capsule Take 300 mg by mouth 2 (two) times daily. 06/09/15 06/08/16 Yes Historical Provider, MD  hydrochlorothiazide (HYDRODIURIL) 12.5 MG tablet Take 12.5 mg by mouth daily.   Yes Historical Provider, MD  omeprazole (PRILOSEC) 40 MG capsule Take 40 mg by mouth daily.   Yes Historical Provider, MD  potassium chloride (K-DUR,KLOR-CON) 10 MEQ tablet Take 10 mEq by mouth daily.   Yes Historical Provider, MD  simvastatin (ZOCOR) 20 MG tablet Take 20 mg by mouth daily at 6 PM.   Yes Historical Provider, MD  Vitamins/Minerals TABS Take 1 tablet by mouth daily.   Yes Historical Provider, MD     Vital Signs: BP (!) 145/67 (BP Location: Right Arm)   Pulse 86   Temp 98.6 F (37 C) (Oral)   Resp 17   Ht '5\' 2"'$  (1.575 m)   Wt 179 lb (81.2 kg)   SpO2 96%   BMI 32.74 kg/m   Physical Exam  Constitutional: She is oriented to person, place, and time.  Abdominal: Soft. Bowel sounds are normal.  Musculoskeletal: Normal range of motion.  Neurological: She is alert and oriented to person, place, and time.  Skin: Skin is warm and dry.  Bili drain intact Output great 1.8 L yesterday TB 2.2  today    Psychiatric: She has a normal mood and affect. Her behavior is normal. Judgment and thought content normal.  Nursing note and vitals reviewed.   Imaging: Ir Int Lianne Cure Biliary Drain With Cholangiogram  Result Date: 11/03/2015 INDICATION: 75 year old with obstructive jaundice. CT imaging demonstrates an obstruction in the common bile duct. Unclear if this is related to a stone or tumor. Unfortunately, biliary system could not be decompressed or further evaluated from an endoscopic approach. Patient needs biliary decompression and diagnostic imaging of the biliary system. EXAM: PERCUTANEOUS TRANSHEPATIC CHOLANGIOGRAM WITH ULTRASOUND AND FLUOROSCOPIC GUIDANCE PLACEMENT OF INTERNAL/ EXTERNAL BILIARY DRAIN MEDICATIONS: Unasyn 3 g; The antibiotic was administered within an appropriate time frame prior to the initiation of the procedure. ANESTHESIA/SEDATION: Moderate (conscious) sedation was employed during this procedure. A total of Versed 2.0 mg and Fentanyl 75 mcg was administered intravenously. Moderate Sedation Time: 35 minutes. The patient's level of consciousness and vital signs were monitored continuously by radiology nursing throughout the procedure under my direct supervision. FLUOROSCOPY TIME:  Fluoroscopy Time: 8 minutes, 48 mGy COMPLICATIONS: None immediate. PROCEDURE: Informed written consent was obtained from the patient after a thorough discussion of the procedural risks, benefits and alternatives. All questions were addressed. Maximal Sterile Barrier Technique was utilized including caps, mask, sterile gowns, sterile gloves, sterile drape, hand hygiene and skin antiseptic. A timeout was performed prior to the initiation of the procedure. Patient was  placed supine on the interventional table. The anterior abdomen was prepped and draped in a sterile fashion. Dilated intrahepatic ducts identified with ultrasound. Skin was anesthetized with 1% lidocaine. Using ultrasound guidance, a 22 gauge  needle was directed into a dilated intrahepatic duct in the left hepatic lobe. Contrast injection confirmed placement in the biliary system. Wire would not advance from this access due to excessive patient breathing. Needle was redirected slightly more central at a different angle. Contrast was again injected and confirmed placement in the biliary system. A wire was successfully advanced into the central biliary system. Tract was dilated with an Accustick dilator set. A 5 [place] the catheter was advanced beyond the common bile duct obstruction using a Glidewire. Glidewire and catheter were advanced into the duodenum. Tract was dilated to accommodate a 10 [place] biliary drain. The distal aspect was advanced into the duodenum. Biliary sample was sent for cytology and culture. Small amount of contrast was injected to confirm placement. Catheter was sutured to skin. Dressing placed over the catheter. FINDINGS: Severe intrahepatic and extrahepatic biliary dilatation. Access obtained from a dilated branch in the lateral left hepatic lobe. At least 3 large intraluminal filling defects compatible with stones. There are additional smaller stones in the common bile duct and dilated cystic duct. Drainage catheter was successfully advanced into the duodenum. IMPRESSION: Successful percutaneous transhepatic cholangiogram and placement of internal/external biliary drain. Drain was placed from a left hepatic approach. Multiple large filling defects in the extrahepatic biliary system are compatible with stones. PLAN: Follow drain output and lab values. Will need to coordinate stone management with GI. Electronically Signed   By: Markus Daft M.D.   On: 11/03/2015 19:23    Labs:  CBC:  Recent Labs  11/04/15 0439 11/05/15 0808 11/06/15 0551 11/07/15 0624  WBC 9.8 8.9 6.9 6.9  HGB 8.7* 8.6* 9.0* 9.7*  HCT 26.2* 26.1* 27.0* 30.2*  PLT 222 259 307 346    COAGS:  Recent Labs  11/03/15 0751  INR 1.10  APTT 26      BMP:  Recent Labs  11/04/15 1013 11/05/15 0808 11/06/15 0551 11/07/15 0624  NA 133* 132* 134* 134*  K 3.6 3.3* 3.3* 3.7  CL 102 100* 98* 100*  CO2 '25 25 26 26  '$ GLUCOSE 96 107* 94 93  BUN 11 <5* <5* <5*  CALCIUM 7.9* 7.8* 8.0* 8.4*  CREATININE 0.59 0.53 0.58 0.53  GFRNONAA >60 >60 >60 >60  GFRAA >60 >60 >60 >60    LIVER FUNCTION TESTS:  Recent Labs  11/04/15 0439 11/05/15 0808 11/06/15 0551 11/07/15 0624  BILITOT 4.9* 2.8* 2.4* 2.2*  AST '30 21 19 22  '$ ALT 77* 57* 46 45  ALKPHOS 160* 147* 134* 139*  PROT 4.4* 4.8* 5.1* 5.5*  ALBUMIN 1.7* 1.8* 2.0* 2.2*    Assessment and Plan:  Int/Ext Biliary drain placed 8/10 +stones--obstruction Draining well For ERCP today or tomorrow  Electronically Signed: Nazeer Romney A 11/07/2015, 10:12 AM   I spent a total of 15 Minutes at the the patient's bedside AND on the patient's hospital floor or unit, greater than 50% of which was counseling/coordinating care for Biliary drain

## 2015-11-08 ENCOUNTER — Encounter (HOSPITAL_COMMUNITY)
Admission: EM | Disposition: A | Discharge status: Home Health | Payer: Self-pay | Source: Home / Self Care | Attending: Internal Medicine

## 2015-11-08 ENCOUNTER — Inpatient Hospital Stay (HOSPITAL_COMMUNITY): Payer: Medicare Other | Admitting: Anesthesiology

## 2015-11-08 ENCOUNTER — Inpatient Hospital Stay (HOSPITAL_COMMUNITY): Payer: Medicare Other

## 2015-11-08 ENCOUNTER — Encounter (HOSPITAL_COMMUNITY): Payer: Self-pay | Admitting: *Deleted

## 2015-11-08 HISTORY — PX: ERCP: SHX5425

## 2015-11-08 HISTORY — PX: SPYGLASS CHOLANGIOSCOPY: SHX5441

## 2015-11-08 LAB — COMPREHENSIVE METABOLIC PANEL
ALK PHOS: 130 U/L — AB (ref 38–126)
ALT: 37 U/L (ref 14–54)
AST: 20 U/L (ref 15–41)
Albumin: 2.3 g/dL — ABNORMAL LOW (ref 3.5–5.0)
Anion gap: 7 (ref 5–15)
BUN: <5 mg/dL — ABNORMAL LOW (ref 6–20)
CALCIUM: 8.6 mg/dL — AB (ref 8.9–10.3)
CO2: 27 mmol/L (ref 22–32)
CREATININE: 0.59 mg/dL (ref 0.44–1.00)
Chloride: 99 mmol/L — ABNORMAL LOW (ref 101–111)
Glucose, Bld: 101 mg/dL — ABNORMAL HIGH (ref 65–99)
Potassium: 3.9 mmol/L (ref 3.5–5.1)
Sodium: 133 mmol/L — ABNORMAL LOW (ref 135–145)
TOTAL PROTEIN: 5.7 g/dL — AB (ref 6.5–8.1)
Total Bilirubin: 2.1 mg/dL — ABNORMAL HIGH (ref 0.3–1.2)

## 2015-11-08 LAB — CULTURE, BLOOD (ROUTINE X 2)

## 2015-11-08 SURGERY — ERCP, WITH INTERVENTION IF INDICATED
Anesthesia: General

## 2015-11-08 MED ORDER — FENTANYL CITRATE (PF) 100 MCG/2ML IJ SOLN
INTRAMUSCULAR | Status: DC | PRN
  Administered 2015-11-08: 100 ug via INTRAVENOUS

## 2015-11-08 MED ORDER — SODIUM CHLORIDE 0.9 % IV SOLN: INTRAVENOUS | Status: DC

## 2015-11-08 MED ORDER — ONDANSETRON HCL 4 MG/2ML IJ SOLN
INTRAMUSCULAR | Status: DC | PRN
  Administered 2015-11-08: 4 mg via INTRAVENOUS

## 2015-11-08 MED ORDER — SUGAMMADEX SODIUM 200 MG/2ML IV SOLN
INTRAVENOUS | Status: DC | PRN
  Administered 2015-11-08: 200 mg via INTRAVENOUS

## 2015-11-08 MED ORDER — PROPOFOL 10 MG/ML IV BOLUS
INTRAVENOUS | Status: DC | PRN
  Administered 2015-11-08: 100 mg via INTRAVENOUS

## 2015-11-08 MED ORDER — IOPAMIDOL (ISOVUE-300) INJECTION 61%
INTRAVENOUS | Status: DC | PRN
  Administered 2015-11-08: 75 mL

## 2015-11-08 MED ORDER — PHENYLEPHRINE HCL 10 MG/ML IJ SOLN
INTRAVENOUS | Status: DC | PRN
  Administered 2015-11-08: 20 ug/min via INTRAVENOUS

## 2015-11-08 MED ORDER — LIDOCAINE HCL (CARDIAC) 20 MG/ML IV SOLN
INTRAVENOUS | Status: DC | PRN
  Administered 2015-11-08: 60 mg via INTRAVENOUS

## 2015-11-08 MED ORDER — GLUCAGON HCL RDNA (DIAGNOSTIC) 1 MG IJ SOLR
INTRAMUSCULAR | Status: AC
  Filled 2015-11-08: qty 1

## 2015-11-08 MED ORDER — ROCURONIUM BROMIDE 100 MG/10ML IV SOLN
INTRAVENOUS | Status: DC | PRN
  Administered 2015-11-08: 10 mg via INTRAVENOUS
  Administered 2015-11-08: 40 mg via INTRAVENOUS

## 2015-11-08 MED ORDER — LACTATED RINGERS IV SOLN
INTRAVENOUS | Status: DC
  Administered 2015-11-08: 1000 mL via INTRAVENOUS
  Administered 2015-11-08: 13:00:00 via INTRAVENOUS

## 2015-11-08 MED ORDER — IOPAMIDOL (ISOVUE-300) INJECTION 61%
INTRAVENOUS | Status: AC
  Filled 2015-11-08: qty 50

## 2015-11-08 NOTE — Op Note (Signed)
[facility] Patient Name: Amy ShanksGlenda Sheppard Procedure Date : 11/08/2015 MRN: [id number] Attending MD: Vida RiggerMarc Silviano Neuser , MD Date of Birth: [birth year] CSN: [id number] Age: [id number] Admit Type: Inpatient Procedure:                ERCP Indications:              Bile duct stone(s) Providers:                Vida RiggerMarc Mairany Bruno, MD, Dwain SarnaPatricia Ford, RN, Kandice RobinsonsGuillaume Awaka,                            Technician Referring MD:              Medicines:                General Anesthesia Complications:            No immediate complications. Estimated Blood Loss:     Estimated blood loss: none. Procedure:                Pre-Anesthesia Assessment:                           - Prior to the procedure, a History and Physical                            was performed, and patient medications and                            allergies were reviewed. The patient's tolerance of                            previous anesthesia was also reviewed. The risks                            and benefits of the procedure and the sedation                            options and risks were discussed with the patient.                            All questions were answered, and informed consent                            was obtained. Prior Anticoagulants: The patient has                            taken no previous anticoagulant or antiplatelet                            agents. ASA Grade Assessment: II - A patient with                            mild systemic disease. After reviewing the risks                            and benefits,  the patient was deemed in                            satisfactory condition to undergo the procedure.                           After obtaining informed consent, the scope was                            passed under direct vision. Throughout the                            procedure, the patient's blood pressure, pulse, and                            oxygen saturations were monitored continuously. The                     [id number]) scope was introduced through                            the mouth, and used to inject contrast into and                            used to locate the major papilla. The ERCP was                            technically difficult and complex due to abnormal                            anatomy. The patient tolerated the procedure well. Scope In: Scope Out: Findings:      the side-viewing scope was inserted could not be advanced past denal       bulb stricture so we dilated it with the 5.5 cm 12 mm balloon and were       able to advance scope and the drain was brought into viewThe major       papilla was located entirely within a diverticulum. The major papilla       was small. we elected to cannulate next to the drain which was done on       the first attempt using the triple lumen sphincterotome with the JAG       Jagwire and obvious stones were seen on the initial cholangiogram and we       proceeded with the Biliary sphincterotomy was made with a Hydratome       sphincterotome using ERBE electrocautery in the customary fashion until       he had some biliary drainage and could get a fully bowed sphincterotome       easily in and out of the duct and although we cut as far as we thought       we could at this juncturewound up increasing the sphincterotomy later in       the procedure as well. There was no post-sphincterotomy bleeding. The       bile duct was explored endoscopically using the SpyGlass       directisualization probe. The SpyGlass probe was advanced to  the       bifurcation. Visibility with the probe was excellent. The main bile duct       contained multiple stones, the largest of which was large in diameter.       Electrohydraulic lithotripsy was successful mnj but lots of stones and       fragments remained and after a prolonged lithotrips cc session. Dilation       of the common bile duct with a 10 mm balloon dilator was successful. The        biliary tree was swept with a 15 mm balloon starting at the upper third       of the main bile duct. A few stones were removed. Many stones remained.       Debris was swept from the duct. 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                           - The major papilla appeared to be small.                           - Choledocholithiasis was found. Partial removal                            was accomplished by biliary sphincterotomySpyglass                            and lithotripsy and balloon dilation no stent was                            inserted.                           - A biliary sphincterotomy was performed.                           - Lithotripsy was successful.                           - Common bile duct distally was successfully                            dilated.                           - The biliary tree was swept and debris was found. Moderate Sedation:      moderate sedation-none Recommendation:           - Advance diet as tolerated today.                           - Continue present medications.                           - Return to GI clinic PRN.                           -  Telephone GI clinic if symptomatic PRN.                           - Refer to an interventional radiologist To discuss                            options. which include surgery multiple endoscopic                            ttempts as above and consider placing a metal stent                            distally to help dilate the tract and allow                            external drain to be removed and assist with stone                            removal via the stent versus University opinion and                            have discussed those with the family and will                            discuss with patient tomorrow as well Procedure Code(s):        --- Professional ---                           [phone],  Esophagogastroduodenoscopy, flexible,                            transoral; diagnostic, including collection of                            specimen(s) by brushing or washing, when performed                            (separate procedure) Diagnosis Code(s):        --- Professional ---                           K83.9, Disease of biliary tract, unspecified                           K80.50, Calculus of bile duct without cholangitis                            or cholecystitis without obstruction CPT copyright 2016 [employer]. All rights reserved. The codes documented in this report are preliminary and upon coder review may  be revised to meet current compliance requirements. Vida Rigger, MD 11/08/2015 1:56:53 PM This report has been signed electronically. Number of Addenda: 0

## 2015-11-08 NOTE — Anesthesia Procedure Notes (Signed)
Procedure Name: Intubation Date/Time: 11/08/2015 10:45 AM Performed by: Marena ChancyBECKNER, Marializ Ferrebee S Pre-anesthesia Checklist: Emergency Drugs available, Patient identified, Suction available, Patient being monitored and Timeout performed Patient Re-evaluated:Patient Re-evaluated prior to inductionOxygen Delivery Method: Circle system utilized Preoxygenation: Pre-oxygenation with 100% oxygen Intubation Type: IV induction Ventilation: Mask ventilation without difficulty Laryngoscope Size: Miller and 2 Grade View: Grade I Tube type: Oral Tube size: 7.0 mm Number of attempts: 1 Placement Confirmation: ETT inserted through vocal cords under direct vision,  positive ETCO2 and breath sounds checked- equal and bilateral Tube secured with: Tape Dental Injury: Teeth and Oropharynx as per pre-operative assessment

## 2015-11-08 NOTE — Anesthesia Preprocedure Evaluation (Addendum)
Anesthesia Evaluation  Patient identified by MRN, date of birth, ID band Patient awake    Reviewed: Allergy & Precautions, H&P , NPO status , Patient's Chart, lab work & pertinent test results, reviewed documented beta blocker date and time   Airway Mallampati: II  TM Distance: >3 FB Neck ROM: full    Dental no notable dental hx. (+) Dental Advisory Given, Teeth Intact   Pulmonary neg pulmonary ROS, former smoker,    Pulmonary exam normal breath sounds clear to auscultation       Cardiovascular hypertension (medications being held in setting of sepsis), Pt. on home beta blockers Normal cardiovascular exam Rhythm:regular Rate:Normal     Neuro/Psych negative neurological ROS  negative psych ROS   GI/Hepatic negative GI ROS, Neg liver ROS, GERD  Medicated and Controlled,Cholangitis, obstructive jaundice   Endo/Other  negative endocrine ROS  Renal/GU negative Renal ROS  negative genitourinary   Musculoskeletal   Abdominal   Peds  Hematology negative hematology ROS (+) anemia ,   Anesthesia Other Findings Sepsis, on Unasyn  Reproductive/Obstetrics negative OB ROS                             Lab Results  Component Value Date   WBC 6.9 11/07/2015   HGB 9.7 (L) 11/07/2015   HCT 30.2 (L) 11/07/2015   MCV 88.6 11/07/2015   PLT 346 11/07/2015   Lab Results  Component Value Date   CREATININE 0.59 11/08/2015   BUN <5 (L) 11/08/2015   NA 133 (L) 11/08/2015   K 3.9 11/08/2015   CL 99 (L) 11/08/2015   CO2 27 11/08/2015    Anesthesia Physical  Anesthesia Plan  ASA: III  Anesthesia Plan: General   Post-op Pain Management:    Induction: Intravenous  Airway Management Planned: Oral ETT  Additional Equipment:   Intra-op Plan:   Post-operative Plan: Extubation in OR  Informed Consent: I have reviewed the patients History and Physical, chart, labs and discussed the procedure  including the risks, benefits and alternatives for the proposed anesthesia with the patient or authorized representative who has indicated his/her understanding and acceptance.   Dental advisory given  Plan Discussed with: CRNA  Anesthesia Plan Comments:         Anesthesia Quick Evaluation

## 2015-11-08 NOTE — Transfer of Care (Signed)
Immediate Anesthesia Transfer of Care Note  Patient: Amy ShanksGlenda Sheppard  Procedure(s) Performed: Procedure(s): ENDOSCOPIC RETROGRADE CHOLANGIOPANCREATOGRAPHY (ERCP) (N/A) SPYGLASS CHOLANGIOSCOPY (N/A)  Patient Location: PACU  Anesthesia Type:General  Level of Consciousness: awake, alert  and oriented  Airway & Oxygen Therapy: Patient Spontanous Breathing and Patient connected to nasal cannula oxygen  Post-op Assessment: Report given to RN, Post -op Vital signs reviewed and stable and Patient moving all extremities X 4  Post vital signs: Reviewed and stable  Last Vitals:  Vitals:   11/08/15 0554 11/08/15 0924  BP: 136/69 136/78  Pulse: 82   Resp: 18 (!) 24  Temp: 36.9 C 36.9 C    Last Pain:  Vitals:   11/08/15 0924  TempSrc: Oral  PainSc:       Patients Stated Pain Goal: 3 (11/06/15 2128)  Complications: No apparent anesthesia complications

## 2015-11-08 NOTE — Progress Notes (Signed)
PROGRESS NOTE    Amy Sheppard  UJW:[id number] DOB: July 13, 1940 DOA: 10/30/2015 PCP: Gildardo Griffes, MD    Brief Narrative:  Amy Sheppard a 75 y.o.femalewith medical history significant of hypertension who presents with 5 day complaint of poor oral intake. Started on Wednesday night when patient had nausea and emesis. Has felt weak since. Has had poor oral intake as well. At first she thought it was something she ate Wednesday but the problem has persisted. Given her progressive weakness she decided to come to the hospital for further evaluation recommendations.  Patient was assessed noted to be jaundiced with a transaminitis and noted to have biliary duct dilatation. GI was consulted and patient underwent an unsuccessful ERCP. Patient was subsequently seen by interventional radiology and underwent percutaneous hepatic cholangiogram with drain placement and fluid obtained positive for Klebsiella pneumonia. Patient placed empirically on IV Unasyn. It was noted patient likely had choledocholithiasis. Patient improved clinically LFTs trended down. Patient underwent repeat ERCP 11/08/2015 Gastroenterology to present various options to the patient on 11/09/2015.   Assessment & Plan:   Principal Problem:   Obstructive jaundice Active Problems:   Calculus of bile duct with obstruction and without cholangitis or cholecystitis   Common bile duct dilatation   Transaminitis   Hypophosphatemia   Hypokalemia   Wheezing   Essential hypertension   Dehydration   SOB (shortness of breath)   #1 obstructive jaundice secondary to common bile duct stones/choledocholithiasis Patient had presented with nausea, vomiting noted to be jaundiced. Patient noted to have a transaminitis. CT abdomen and pelvis with an obstructing hyperdense mass versus stone in the common bile duct measuring 1.2 cm causing market CBD dilatation and intrahepatic bile duct dilatation. LFTs trending down. Patient has been assessed by  GI and status post failed ERCP with balloon dilatation of distal duodenum and diagnostic endoscopic procedure 11/02/2015. Patient was seen in consultation by interventional radiology and subsequently underwent percutaneous transhepatic cholangiogram with left internal/external biliary drain and fluid sent for culture and cytology. Findings on PT cholangiogram did show severe biliary dilatation with multiple large filling defects in the, bile duct consistent with stones. Patient tolerating full liquid diet. Culture results from drain positive for Klebsiella pneumonia sensitive to Unasyn. Continue Unasyn. CT abdomen and pelvis on admission did show gallbladder distention containing small amount of layering stones and/or sludge. GI and IR following and appreciate input and recommendations. Patient s/p repeat ERCP today 11/08/2015 which showed major papillar entirely within diverticulum. Choledocholithiasis found. Partial removal was accomplished by biliary sphincterotomy and lithotripsy and balloon dilatation no stent inserted. Biliary sphincterotomy was performed., Bile duct distally was successfully dilated. Biliary tree was swept and debris was found. GI to discuss various options to patient tomorrow. On discharge will recommend transition IV antibiotics to oral Augmentin to complete a two-week course of antibiotic treatment.   #2 hypokalemia/hypophosphatemia Likely secondary to GI losses. Replete.   #3 transaminitis Likely secondary to problem #1. LFTs trending down. Bilirubin trending back down. Patient s/p failed ERCP 11/02/2015 with balloon dilatation of distal duodenum and diagnostic endoscopy procedure. Patient underwent successful percutaneous transhepatic cholangiogram with internal/external biliary drain placement. Interventional radiology. Culture results from drain positive for Klebsiella pneumonia which is sensitive to Unasyn. Monitor LFTs closely. Continue empiric IV antibiotics. Pt s/p repeat  ERCP 11/08/2015 with choledocholiathiasis, partial removal via biliary sphincterotomy and lithotripsy and balloon dilatation. GI and IR following and appreciate input and recommendations.   #4 wheezing Patient noted to be wheezing on examination 11/01/15, which has since resolved with  a dose of IV Solu-Medrol, scheduled nebs, Pulmicort and Brovana. Follow.  #5 leukocytosis Patient with a worsening leukocytosis with a white count of 16,000 (11/02/2015) from 11,000. Leukocytosis improved and white count currently at 6.9. Likely secondary to Klebsiella pneumonia which grew out of drain culture and IV Solu-Medrol dose which was given on 11/01/2015. Patient currently on IV Unasyn. Follow.  #6 hypertension Continue home regimen of atenolol and Norvasc.   DVT prophylaxis: SCDs Code Status: Full Family Communication: Updated patient. No family at bedside.  Disposition Plan: Home when workup is complete and medically stable, and per GI   Consultants:   Gastroenterology: Dr. Evette CristalGanem 10/31/2015  Interventional radiology: Dr. Lowella DandyHenn 11/03/2015  Procedures:   CT abdomen and pelvis 10/30/2015  Chest x-ray 11/01/2015  Unsuccessful ERCP 11/02/2015 per Dr.Magod  Percutaneous chance hepatic cholangiogram per Dr. Lowella DandyHenn 05/06/2015  ERCP 11/08/2015--Per Dr Ewing SchleinMagod  Antimicrobials:   IV Unasyn 10/31/2015   Subjective: Patient laying in bed and has just returned from ERCP. Patient denies any chest pain. No shortness of breath.   Objective: Vitals:   11/08/15 1324 11/08/15 1345 11/08/15 1400 11/08/15 1529  BP: (!) 156/93 (!) 161/89 (!) 158/84 136/76  Pulse: 95 86 84 94  Resp: 14 16 (!) 48 20  Temp: 97.5 F (36.4 C)  97.8 F (36.6 C) 97 F (36.1 C)  TempSrc:      SpO2: 95% 92% 94% 98%  Weight:      Height:        Intake/Output Summary (Last 24 hours) at 11/08/15 2013 Last data filed at 11/08/15 1833  Gross per 24 hour  Intake             1660 ml  Output             3655 ml  Net             -1995 ml   Filed Weights   10/30/15 0927 10/30/15 1757 11/02/15 1400  Weight: 77.1 kg (170 lb) 81.2 kg (179 lb) 81.2 kg (179 lb)    Examination:  General exam: Appears calm and comfortable.Less jaundiced. Respiratory system: CTAB. Respiratory effort normal. Cardiovascular system: S1 & S2 heard, RRR. No JVD, murmurs, rubs, gallops or clicks. No pedal edema. Gastrointestinal system: Abdomen is nondistended, soft and nontender. No organomegaly or masses felt. Normal bowel sounds heard. Drain with biliary drainage noted. Central nervous system: Alert and oriented. No focal neurological deficits. Extremities: Symmetric 5 x 5 power. Skin: No rashes, lesions or ulcers Psychiatry: Judgement and insight appear normal. Mood & affect appropriate.     Data Reviewed: I have personally reviewed following labs and imaging studies  CBC:  Recent Labs Lab 11/02/15 0815 11/04/15 0439 11/05/15 0808 11/06/15 0551 11/07/15 0624  WBC 16.0* 9.8 8.9 6.9 6.9  NEUTROABS 14.1* 8.1*  --   --  5.1  HGB 11.2* 8.7* 8.6* 9.0* 9.7*  HCT 33.3* 26.2* 26.1* 27.0* 30.2*  MCV 86.9 87.6 85.6 85.4 88.6  PLT 224 222 259 307 346   Basic Metabolic Panel:  Recent Labs Lab 11/02/15 0600 11/04/15 0439 11/04/15 1013 11/05/15 0808 11/06/15 0551 11/07/15 0624 11/08/15 0602  NA 132*  --  133* 132* 134* 134* 133*  K 3.5  --  3.6 3.3* 3.3* 3.7 3.9  CL 100*  --  102 100* 98* 100* 99*  CO2 25  --  25 25 26 26 27   GLUCOSE 149*  --  96 107* 94 93 101*  BUN 6  --  11 <5* <5* <5* <5*  CREATININE 0.45  --  0.59 0.53 0.58 0.53 0.59  CALCIUM 7.7*  --  7.9* 7.8* 8.0* 8.4* 8.6*  MG 2.0 2.0  --  1.8 2.2 2.1  --   PHOS 2.0*  --   --  3.6 4.1 3.8  --    GFR: Estimated Creatinine Clearance: 61.8 mL/min (by C-G formula based on SCr of 0.8 mg/dL). Liver Function Tests:  Recent Labs Lab 11/04/15 0439 11/05/15 0808 11/06/15 0551 11/07/15 0624 11/08/15 0602  AST 30 21 19 22 20   ALT 77* 57* 46 45 37  ALKPHOS 160*  147* 134* 139* 130*  BILITOT 4.9* 2.8* 2.4* 2.2* 2.1*  PROT 4.4* 4.8* 5.1* 5.5* 5.7*  ALBUMIN 1.7* 1.8* 2.0* 2.2* 2.3*   No results for input(s): LIPASE, AMYLASE in the last 168 hours. No results for input(s): AMMONIA in the last 168 hours. Coagulation Profile:  Recent Labs Lab 11/03/15 0751  INR 1.10   Cardiac Enzymes: No results for input(s): CKTOTAL, CKMB, CKMBINDEX, TROPONINI in the last 168 hours. BNP (last 3 results) No results for input(s): PROBNP in the last 8760 hours. HbA1C: No results for input(s): HGBA1C in the last 72 hours. CBG: No results for input(s): GLUCAP in the last 168 hours. Lipid Profile: No results for input(s): CHOL, HDL, LDLCALC, TRIG, CHOLHDL, LDLDIRECT in the last 72 hours. Thyroid Function Tests: No results for input(s): TSH, T4TOTAL, FREET4, T3FREE, THYROIDAB in the last 72 hours. Anemia Panel: No results for input(s): VITAMINB12, FOLATE, FERRITIN, TIBC, IRON, RETICCTPCT in the last 72 hours. Sepsis Labs: No results for input(s): PROCALCITON, LATICACIDVEN in the last 168 hours.  Recent Results (from the past 240 hour(s))  Culture, blood (routine x 2)     Status: Abnormal   Collection Time: 10/31/15  6:40 AM  Result Value Ref Range Status   Specimen Description BLOOD LEFT HAND  Final   Special Requests IN PEDIATRIC BOTTLE 0.1CC  Final   Culture  Setup Time   Final    GRAM VARIABLE COCCOBACILLI AEROBIC BOTTLE ONLY CRITICAL RESULT CALLED TO, READ BACK BY AND VERIFIED WITH: C BALL,PHARMD AT 1301 11/05/15 BY L BENFIELD    Culture MORAXELLA SPECIES BETA LACTAMASE NEGATIVE  (A)  Final   Report Status 11/08/2015 FINAL  Final  Blood Culture ID Panel (Reflexed)     Status: None   Collection Time: 10/31/15  6:40 AM  Result Value Ref Range Status   Enterococcus species NOT DETECTED NOT DETECTED Final   Vancomycin resistance NOT DETECTED NOT DETECTED Final   Listeria monocytogenes NOT DETECTED NOT DETECTED Final   Staphylococcus species NOT DETECTED  NOT DETECTED Final   Staphylococcus aureus NOT DETECTED NOT DETECTED Final   Methicillin resistance NOT DETECTED NOT DETECTED Final   Streptococcus species NOT DETECTED NOT DETECTED Final   Streptococcus agalactiae NOT DETECTED NOT DETECTED Final   Streptococcus pneumoniae NOT DETECTED NOT DETECTED Final   Streptococcus pyogenes NOT DETECTED NOT DETECTED Final   Acinetobacter baumannii NOT DETECTED NOT DETECTED Final   Enterobacteriaceae species NOT DETECTED NOT DETECTED Final   Enterobacter cloacae complex NOT DETECTED NOT DETECTED Final   Escherichia coli NOT DETECTED NOT DETECTED Final   Klebsiella oxytoca NOT DETECTED NOT DETECTED Final   Klebsiella pneumoniae NOT DETECTED NOT DETECTED Final   Proteus species NOT DETECTED NOT DETECTED Final   Serratia marcescens NOT DETECTED NOT DETECTED Final   Carbapenem resistance NOT DETECTED NOT DETECTED Final   Haemophilus influenzae NOT DETECTED NOT DETECTED  Final   Neisseria meningitidis NOT DETECTED NOT DETECTED Final   Pseudomonas aeruginosa NOT DETECTED NOT DETECTED Final   Candida albicans NOT DETECTED NOT DETECTED Final   Candida glabrata NOT DETECTED NOT DETECTED Final   Candida krusei NOT DETECTED NOT DETECTED Final   Candida parapsilosis NOT DETECTED NOT DETECTED Final   Candida tropicalis NOT DETECTED NOT DETECTED Final  Culture, blood (routine x 2)     Status: None   Collection Time: 10/31/15  6:53 AM  Result Value Ref Range Status   Specimen Description BLOOD LEFT HAND  Final   Special Requests IN PEDIATRIC BOTTLE 0.2CC  Final   Culture NO GROWTH 5 DAYS  Final   Report Status 11/05/2015 FINAL  Final  Culture, body fluid-bottle     Status: Abnormal (Preliminary result)   Collection Time: 11/03/15  2:55 PM  Result Value Ref Range Status   Specimen Description FLUID BILE  Final   Special Requests BOTTLES DRAWN AEROBIC AND ANAEROBIC 10CC  Final   Gram Stain   Final    GRAM VARIABLE ROD IN BOTH AEROBIC AND ANAEROBIC  BOTTLES BOTTLE GROWTH CONSISTENT WITH ORIGINAL GRAM STAIN RESULTS    Culture (A)  Final    KLEBSIELLA PNEUMONIAE ESCHERICHIA COLI Sent to [facility] for further susceptibility testing. CULTURE REINCUBATED FOR BETTER GROWTH    Report Status PENDING  Incomplete   Organism ID, Bacteria KLEBSIELLA PNEUMONIAE  Final      Susceptibility   Klebsiella pneumoniae - MIC*    AMPICILLIN 16 RESISTANT Resistant     CEFAZOLIN <=4 SENSITIVE Sensitive     CEFEPIME <=1 SENSITIVE Sensitive     CEFTAZIDIME <=1 SENSITIVE Sensitive     CEFTRIAXONE <=1 SENSITIVE Sensitive     CIPROFLOXACIN <=0.25 SENSITIVE Sensitive     GENTAMICIN <=1 SENSITIVE Sensitive     IMIPENEM <=0.25 SENSITIVE Sensitive     TRIMETH/SULFA <=20 SENSITIVE Sensitive     AMPICILLIN/SULBACTAM 4 SENSITIVE Sensitive     PIP/TAZO 8 SENSITIVE Sensitive     Extended ESBL NEGATIVE Sensitive     * KLEBSIELLA PNEUMONIAE  Gram stain     Status: None   Collection Time: 11/03/15  2:55 PM  Result Value Ref Range Status   Specimen Description FLUID BILE  Final   Special Requests NONE  Final   Gram Stain   Final    FEW WBC PRESENT, PREDOMINANTLY PMN MODERATE GRAM NEGATIVE RODS FEW GRAM POSITIVE RODS    Report Status 11/03/2015 FINAL  Final         Radiology Studies: Dg Ercp Biliary & Pancreatic Ducts  Result Date: 11/08/2015 CLINICAL DATA:  Biliary stone EXAM: ERCP TECHNIQUE: Multiple spot images obtained with the fluoroscopic device and submitted for interpretation post-procedure. COMPARISON:  Ultrasound fluoroscopic guided placement of a left biliary approach percutaneous drainage catheter - 11/03/2015; CT abdomen pelvis -10/29/2025 FINDINGS: Five spot intraoperative fluoroscopic images of the right upper abdominal quadrant during ERCP are provided for review. Initial image demonstrates an ERCP probe overlying the right upper abdominal quadrant. Additionally, there is a left hepatic approach biliary percutaneous drainage catheter also  overlying the left upper abdominal quadrant. Subsequent images demonstrate faint opacification of the markedly dilated common bile duct with multiple nonocclusive filling defects, potentially representative of biliary sludge Completion image demonstrates advancement of the percutaneous biliary drainage catheter with radiopaque tip overlying the expected location of the cranial aspect of the CBD. IMPRESSION: ERCP as detailed above. These images were submitted for radiologic interpretation only. Please see the procedural  report for the amount of contrast and the fluoroscopy time utilized. Electronically Signed   By: Simonne Come M.D.   On: 11/08/2015 13:28        Scheduled Meds: . amLODipine  5 mg Oral Daily  . ampicillin-sulbactam (UNASYN) IV  3 g Intravenous Q6H  . arformoterol  15 mcg Nebulization BID  . aspirin EC  81 mg Oral QHS  . atenolol  25 mg Oral Daily  . budesonide (PULMICORT) nebulizer solution  0.25 mg Nebulization BID  . cholestyramine  4 g Oral BID  . gabapentin  300 mg Oral BID  . pantoprazole  40 mg Oral Daily  . potassium chloride  10 mEq Oral Daily  . sodium phosphate  Dextrose 5% IVPB  15 mmol Intravenous Once   Continuous Infusions:     LOS: 8 days    Time spent: 35 minutes    Gizzelle Lacomb, MD [facility] Pager [phone]  If 7PM-7AM, please contact night-coverage [email or web address] Password [facility] 11/08/2015, 8:13 PM

## 2015-11-08 NOTE — Anesthesia Postprocedure Evaluation (Signed)
Anesthesia Post Note  Patient: Amy ShanksGlenda Sheppard  Procedure(s) Performed: Procedure(s) (LRB): ENDOSCOPIC RETROGRADE CHOLANGIOPANCREATOGRAPHY (ERCP) (N/A) SPYGLASS CHOLANGIOSCOPY (N/A)  Patient location during evaluation: PACU Anesthesia Type: General Level of consciousness: awake and alert Pain management: pain level controlled Vital Signs Assessment: post-procedure vital signs reviewed and stable Respiratory status: spontaneous breathing, nonlabored ventilation, respiratory function stable and patient connected to nasal cannula oxygen Cardiovascular status: blood pressure returned to baseline and stable Postop Assessment: no signs of nausea or vomiting Anesthetic complications: no    Last Vitals:  Vitals:   11/08/15 1345 11/08/15 1400  BP: (!) 161/89 (!) 158/84  Pulse: 86 84  Resp: 16 (!) 48  Temp:  36.6 C    Last Pain:  Vitals:   11/08/15 1400  TempSrc:   PainSc: 0-No pain                 Jester Klingberg,JAMES TERRILL

## 2015-11-08 NOTE — Progress Notes (Signed)
Amy Sheppard 10:10 AM  Subjective: Patient without any GI complaints having lots of drainage from her tube and case rediscussed with her and her sister  Objective: Vital signs stable afebrile no acute distress exam please see preassessment evaluation PTC and labs reviewed  Assessment: CBD stones  Plan: Okay to proceed with ERCP with anesthesia assistance today and will discuss the timing of drain removal with IR particularly if successful  MAGOD,MARC E  Pager [phone] After 5PM or if no answer call [phone] 

## 2015-11-09 ENCOUNTER — Encounter (HOSPITAL_COMMUNITY): Payer: Self-pay | Admitting: Gastroenterology

## 2015-11-09 DIAGNOSIS — K838 Other specified diseases of biliary tract: Secondary | ICD-10-CM

## 2015-11-09 LAB — CBC
HEMATOCRIT: 31.7 % — AB (ref 36.0–46.0)
HEMOGLOBIN: 9.9 g/dL — AB (ref 12.0–15.0)
MCH: 28 pg (ref 26.0–34.0)
MCHC: 31.2 g/dL (ref 30.0–36.0)
MCV: 89.5 fL (ref 78.0–100.0)
Platelets: 373 10*3/uL (ref 150–400)
RBC: 3.54 MIL/uL — ABNORMAL LOW (ref 3.87–5.11)
RDW: 15.4 % (ref 11.5–15.5)
WBC: 9.4 10*3/uL (ref 4.0–10.5)

## 2015-11-09 LAB — COMPREHENSIVE METABOLIC PANEL
ALK PHOS: 124 U/L (ref 38–126)
ALT: 31 U/L (ref 14–54)
ANION GAP: 9 (ref 5–15)
AST: 17 U/L (ref 15–41)
Albumin: 2.3 g/dL — ABNORMAL LOW (ref 3.5–5.0)
BUN: 5 mg/dL — AB (ref 6–20)
CHLORIDE: 99 mmol/L — AB (ref 101–111)
CO2: 25 mmol/L (ref 22–32)
Calcium: 8.5 mg/dL — ABNORMAL LOW (ref 8.9–10.3)
Creatinine, Ser: 0.66 mg/dL (ref 0.44–1.00)
GFR calc Af Amer: >60 mL/min (ref >60)
GLUCOSE: 99 mg/dL (ref 65–99)
POTASSIUM: 3.9 mmol/L (ref 3.5–5.1)
Sodium: 133 mmol/L — ABNORMAL LOW (ref 135–145)
TOTAL PROTEIN: 5.6 g/dL — AB (ref 6.5–8.1)
Total Bilirubin: 1.9 mg/dL — ABNORMAL HIGH (ref 0.3–1.2)

## 2015-11-09 MED ORDER — HEPARIN SODIUM (PORCINE) 5000 UNIT/ML IJ SOLN
5000.0000 [IU] | Freq: Three times a day (TID) | INTRAMUSCULAR | Status: DC
  Administered 2015-11-09 – 2015-11-10 (×3): 5000 [IU] via SUBCUTANEOUS
  Filled 2015-11-09 (×4): qty 1

## 2015-11-09 NOTE — Progress Notes (Signed)
Patient seen and case discussed with my partner Dr. Dulce Sellaroutlaw and we discussed yesterday's procedure and discussed the options including surgical consultation [facility] or another attempt here week as an outpatient and [facility] and she is eating fine without obvious problems from her procedure yesterday and once she goes home we are happy to set her up for that consult and she'll need outpatient follow-up with interventional radiology as well but I did offer proceeding next week and leaving a stent internally and then we could remove her drain the following day if she would like us to continue to manage her care so please let me know if I can be of any further assistance and I am happy to see back when necessary

## 2015-11-09 NOTE — Progress Notes (Signed)
Referring Physician(s): Dr Anson Fret  Supervising Physician: Dr. Corrie Mckusick  Patient Status:  Inpatient  Chief Complaint: Obstructive jaundice secondary stones PTC with int/ext biliary drain placement 8/10   Subjective: Better daily Biliary drain draining well Underwent ERCP yesterday, findings/report reviewed   Allergies: Tetanus toxoids  Medications:  Current Facility-Administered Medications:  .  acetaminophen (TYLENOL) tablet 650 mg, 650 mg, Oral, Q4H PRN, Debbe Odea, MD, 650 mg at 11/09/15 0436 .  amLODipine (NORVASC) tablet 5 mg, 5 mg, Oral, Daily, Eugenie Filler, MD, 5 mg at 11/07/15 1016 .  Ampicillin-Sulbactam (UNASYN) 3 g in sodium chloride 0.9 % 100 mL IVPB, 3 g, Intravenous, Q6H, Donalynn Furlong [place], RPH, 3 g at 11/09/15 4132 .  arformoterol (BROVANA) nebulizer solution 15 mcg, 15 mcg, Nebulization, BID, Eugenie Filler, MD, 15 mcg at 11/09/15 0757 .  aspirin EC tablet 81 mg, 81 mg, Oral, QHS, Eugenie Filler, MD, 81 mg at 11/08/15 2102 .  atenolol (TENORMIN) tablet 25 mg, 25 mg, Oral, Daily, Eugenie Filler, MD, 25 mg at 11/07/15 1016 .  budesonide (PULMICORT) nebulizer solution 0.25 mg, 0.25 mg, Nebulization, BID, Eugenie Filler, MD, 0.25 mg at 11/09/15 0757 .  cholestyramine (QUESTRAN) packet 4 g, 4 g, Oral, BID, Eugenie Filler, MD, 4 g at 11/08/15 2301 .  gabapentin (NEURONTIN) capsule 300 mg, 300 mg, Oral, BID, Eugenie Filler, MD, 300 mg at 11/08/15 2102 .  hydrALAZINE (APRESOLINE) injection 10 mg, 10 mg, Intravenous, Q6H PRN, Eugenie Filler, MD .  HYDROcodone-acetaminophen (NORCO/VICODIN) 5-325 MG per tablet 1-2 tablet, 1-2 tablet, Oral, Q4H PRN, Velvet Bathe, MD, 1 tablet at 10/30/15 1927 .  ibuprofen (ADVIL,MOTRIN) tablet 400 mg, 400 mg, Oral, Q4H PRN, Debbe Odea, MD .  ipratropium-albuterol (DUONEB) 0.5-2.5 (3) MG/3ML nebulizer solution 3 mL, 3 mL, Nebulization, Q2H PRN, Eugenie Filler, MD .  ondansetron [facility]) tablet 4  mg, 4 mg, Oral, Q6H PRN **OR** ondansetron (ZOFRAN) injection 4 mg, 4 mg, Intravenous, Q6H PRN, Velvet Bathe, MD .  pantoprazole (PROTONIX) EC tablet 40 mg, 40 mg, Oral, Daily, Eugenie Filler, MD, 40 mg at 11/07/15 1016 .  potassium chloride (K-DUR,KLOR-CON) CR tablet 10 mEq, 10 mEq, Oral, Daily, Eugenie Filler, MD, 10 mEq at 11/07/15 1016 .  sodium phosphate 15 mmol in dextrose 5 % 250 mL infusion, 15 mmol, Intravenous, Once, Donalynn Furlong [place], RPH    Vital Signs: BP 115/60 (BP Location: Right Arm)   Pulse 92   Temp 98.4 F (36.9 C)   Resp 18   Ht '5\' 2"'$  (1.575 m)   Wt 179 lb (81.2 kg)   SpO2 95%   BMI 32.74 kg/m   Physical Exam  Constitutional: She is oriented to person, place, and time.  Abdominal: Soft. Bowel sounds are normal.  Musculoskeletal: Normal range of motion.  Neurological: She is alert and oriented to person, place, and time.  Skin: Skin is warm and dry.  Bili drain intact Output with some sludge/small gravel debris    Psychiatric: She has a normal mood and affect. Her behavior is normal. Judgment and thought content normal.  Nursing note and vitals reviewed.   Imaging: Dg Ercp Biliary & Pancreatic Ducts  Result Date: 11/08/2015 CLINICAL DATA:  Biliary stone EXAM: ERCP TECHNIQUE: Multiple spot images obtained with the fluoroscopic device and submitted for interpretation post-procedure. COMPARISON:  Ultrasound fluoroscopic guided placement of a left biliary approach percutaneous drainage catheter - 11/03/2015; CT abdomen pelvis -10/29/2025 FINDINGS: Five spot  intraoperative fluoroscopic images of the right upper abdominal quadrant during ERCP are provided for review. Initial image demonstrates an ERCP probe overlying the right upper abdominal quadrant. Additionally, there is a left hepatic approach biliary percutaneous drainage catheter also overlying the left upper abdominal quadrant. Subsequent images demonstrate faint opacification of the markedly dilated  common bile duct with multiple nonocclusive filling defects, potentially representative of biliary sludge Completion image demonstrates advancement of the percutaneous biliary drainage catheter with radiopaque tip overlying the expected location of the cranial aspect of the CBD. IMPRESSION: ERCP as detailed above. These images were submitted for radiologic interpretation only. Please see the procedural report for the amount of contrast and the fluoroscopy time utilized. Electronically Signed   By: Sandi Mariscal M.D.   On: 11/08/2015 13:28    Labs:  CBC:  Recent Labs  11/05/15 0808 11/06/15 0551 11/07/15 0624 11/09/15 0805  WBC 8.9 6.9 6.9 9.4  HGB 8.6* 9.0* 9.7* 9.9*  HCT 26.1* 27.0* 30.2* 31.7*  PLT 259 307 346 373    COAGS:  Recent Labs  11/03/15 0751  INR 1.10  APTT 26    BMP:  Recent Labs  11/05/15 0808 11/06/15 0551 11/07/15 0624 11/08/15 0602  NA 132* 134* 134* 133*  K 3.3* 3.3* 3.7 3.9  CL 100* 98* 100* 99*  CO2 _0 GLUCOSE 107* 94 93 101*  BUN <5* <5* <5* <5*  CALCIUM 7.8* 8.0* 8.4* 8.6*  CREATININE 0.53 0.58 0.53 0.59  GFRNONAA >60 >60 >60 >60  GFRAA >60 >60 >60 >60    LIVER FUNCTION TESTS:  Recent Labs  11/05/15 0808 11/06/15 0551 11/07/15 0624 11/08/15 0602  BILITOT 2.8* 2.4* 2.2* 2.1*  AST _1 ALT 57* 46 45 37  ALKPHOS 147* 134* 139* 130*  PROT 4.8* 5.1* 5.5* 5.7*  ALBUMIN 1.8* 2.0* 2.2* 2.3*    Assessment and Plan: Int/Ext Biliary drain placed 8/10 S/p ERCP yesterday with partial clearance, sphincterotomy and lithrotripsy  Labs pending today Clinically doing well. Would favor leaving drain to gravity for now to assist with removal of residual sludge/stone particles. GI to further discuss today  Electronically Signed: Ascencion Dike 11/09/2015, 9:03 AM   I spent a total of 15 Minutes at the the patient's bedside AND on the patient's hospital floor or unit, greater than 50% of which was counseling/coordinating care  for Biliary drain

## 2015-11-09 NOTE — Progress Notes (Signed)
Talked to patient at length.  [facility]  I told her that I think this could be accomplished as an outpatient.  I will discuss with Dr. Ewing SchleinMagod.  Unless there are any other extenuating circumstances, I am hopeful that patient can be discharged home soon, assuming patient can be transitioned to oral antibiotics and is strong enough from a PT/OT perspective to be released.  I will advance her diet today.

## 2015-11-09 NOTE — Progress Notes (Addendum)
PROGRESS NOTE    Amy Sheppard  ZOX:[id number] DOB: 06-06-1940 DOA: 10/30/2015 PCP: Gildardo Griffes, MD    Brief Narrative:  Amy Sheppard a 75 y.o.femalewith medical history significant of hypertension who presents with 5 day complaint of poor oral intake. Started on Wednesday night when patient had nausea and emesis. Has felt weak since. Has had poor oral intake as well. At first she thought it was something she ate Wednesday but the problem has persisted. Given her progressive weakness she decided to come to the hospital for further evaluation recommendations.  Patient was assessed noted to be jaundiced with a transaminitis and noted to have biliary duct dilatation. GI was consulted and patient underwent an unsuccessful ERCP. Patient was subsequently seen by interventional radiology and underwent percutaneous hepatic cholangiogram with drain placement and fluid obtained positive for Klebsiella pneumonia. Patient placed empirically on IV Unasyn. It was noted patient likely had choledocholithiasis. Patient improved clinically LFTs trended down. Patient underwent repeat ERCP 11/08/2015 Gastroenterology to present various options to the patient on 11/09/2015.   Assessment & Plan:   Principal Problem:   Obstructive jaundice Active Problems:   Calculus of bile duct with obstruction and without cholangitis or cholecystitis   Common bile duct dilatation   Transaminitis   Hypophosphatemia   Hypokalemia   Wheezing   Essential hypertension   Dehydration   SOB (shortness of breath)   #1 obstructive jaundice secondary to common bile duct stones/choledocholithiasis Patient had presented with nausea, vomiting noted to be jaundiced. Patient noted to have a transaminitis. CT abdomen and pelvis with an obstructing hyperdense mass versus stone in the common bile duct measuring 1.2 cm causing market CBD dilatation and intrahepatic bile duct dilatation. LFTs trending down. Patient has been assessed by  GI and status post failed ERCP with balloon dilatation of distal duodenum and diagnostic endoscopic procedure 11/02/2015. Patient was seen in consultation by interventional radiology and subsequently underwent percutaneous transhepatic cholangiogram with left internal/external biliary drain and fluid sent for culture and cytology. Findings on PT cholangiogram did show severe biliary dilatation with multiple large filling defects in the, bile duct consistent with stones. Patient tolerating full liquid diet. Culture results from drain positive for Klebsiella pneumonia sensitive to Unasyn. Continue Unasyn. CT abdomen and pelvis on admission did show gallbladder distention containing small amount of layering stones and/or sludge. GI and IR following and appreciate input and recommendations. Patient s/p repeat ERCP today 11/08/2015 which showed major papillar entirely within diverticulum. Choledocholithiasis found. Partial removal was accomplished by biliary sphincterotomy and lithotripsy and balloon dilatation no stent inserted. Biliary sphincterotomy was performed., Bile duct distally was successfully dilated. Biliary tree was swept and debris was found. GI to discuss various options with patient today. On discharge will recommend transition IV antibiotics to oral Augmentin to complete a two-week course of antibiotic treatment.   #2 hypokalemia/hypophosphatemia Likely secondary to GI losses. Replete.   #3 transaminitis Likely secondary to problem #1. LFTs trending down. Bilirubin trending back down. Patient s/p failed ERCP 11/02/2015 with balloon dilatation of distal duodenum and diagnostic endoscopy procedure. Patient underwent successful percutaneous transhepatic cholangiogram with internal/external biliary drain placement. Interventional radiology. Culture results from drain positive for Klebsiella pneumonia which is sensitive to Unasyn. Monitor LFTs closely. Continue empiric IV antibiotics. Pt s/p repeat  ERCP 11/08/2015 with choledocholiathiasis, partial removal via biliary sphincterotomy and lithotripsy and balloon dilatation. GI and IR following and appreciate input and recommendations.   #4 wheezing Patient noted to be wheezing on examination 11/01/15, which has since resolved with  a dose of IV Solu-Medrol, scheduled nebs, Pulmicort and Brovana. Follow.  #5 leukocytosis Patient with a worsening leukocytosis with a white count of 16,000 (11/02/2015) from 11,000. Leukocytosis improved and white count currently at 6.9. Likely secondary to Klebsiella pneumonia which grew out of drain culture and IV Solu-Medrol dose which was given on 11/01/2015. Patient currently on IV Unasyn. Follow.  #6 hypertension Continue home regimen of atenolol and Norvasc.   DVT prophylaxis: SCDs/[place] Heparin Code Status: Full Family Communication: Updated patient. No family at bedside.  Disposition Plan: Home when workup is complete and medically stable, and per GI   Consultants:   Gastroenterology: Dr. Evette CristalGanem 10/31/2015  Interventional radiology: Dr. Lowella DandyHenn 11/03/2015  Procedures:   CT abdomen and pelvis 10/30/2015  Chest x-ray 11/01/2015  Unsuccessful ERCP 11/02/2015 per Dr.Magod  Percutaneous chance hepatic cholangiogram per Dr. Lowella DandyHenn 05/06/2015  ERCP 11/08/2015--Per Dr Ewing SchleinMagod  Antimicrobials:   IV Unasyn 10/31/2015   Subjective: Patient laying . Patient denies any chest pain. No shortness of breath.   Objective: Vitals:   11/08/15 2203 11/09/15 0626 11/09/15 0757 11/09/15 0758  BP: 130/70 115/60    Pulse: 98 92    Resp: 19 18    Temp: 99.8 F (37.7 C) 98.4 F (36.9 C)    TempSrc: Oral     SpO2: 94% 95% 95% 95%  Weight:      Height:        Intake/Output Summary (Last 24 hours) at 11/09/15 1444 Last data filed at 11/09/15 1313  Gross per 24 hour  Intake             1020 ml  Output             3450 ml  Net            -2430 ml   Filed Weights   10/30/15 0927 10/30/15 1757 11/02/15 1400    Weight: 77.1 kg (170 lb) 81.2 kg (179 lb) 81.2 kg (179 lb)    Examination:  General exam: Appears calm and comfortable. Respiratory system: CTAB. Respiratory effort normal. Cardiovascular system: S1 & S2 heard, RRR. No JVD, murmurs, rubs, gallops or clicks. No pedal edema. Gastrointestinal system: Abdomen is nondistended, soft and nontender. No organomegaly or masses felt. Normal bowel sounds heard. Drain with biliary drainage noted. Central nervous system: Alert and oriented. No focal neurological deficits. Extremities: Symmetric 5 x 5 power. Skin: No rashes, lesions or ulcers Psychiatry: Judgement and insight appear normal. Mood & affect appropriate.     Data Reviewed: I have personally reviewed following labs and imaging studies  CBC:  Recent Labs Lab 11/04/15 0439 11/05/15 0808 11/06/15 0551 11/07/15 0624 11/09/15 0805  WBC 9.8 8.9 6.9 6.9 9.4  NEUTROABS 8.1*  --   --  5.1  --   HGB 8.7* 8.6* 9.0* 9.7* 9.9*  HCT 26.2* 26.1* 27.0* 30.2* 31.7*  MCV 87.6 85.6 85.4 88.6 89.5  PLT 222 259 307 346 373   Basic Metabolic Panel:  Recent Labs Lab 11/04/15 0439  11/05/15 0808 11/06/15 0551 11/07/15 0624 11/08/15 0602 11/09/15 0805  NA  --   < > 132* 134* 134* 133* 133*  K  --   < > 3.3* 3.3* 3.7 3.9 3.9  CL  --   < > 100* 98* 100* 99* 99*  CO2  --   < > 25 26 26 27 25   GLUCOSE  --   < > 107* 94 93 101* 99  BUN  --   < > <  5* <5* <5* <5* 5*  CREATININE  --   < > 0.53 0.58 0.53 0.59 0.66  CALCIUM  --   < > 7.8* 8.0* 8.4* 8.6* 8.5*  MG 2.0  --  1.8 2.2 2.1  --   --   PHOS  --   --  3.6 4.1 3.8  --   --   < > = values in this interval not displayed. GFR: Estimated Creatinine Clearance: 61.8 mL/min (by C-G formula based on SCr of 0.8 mg/dL). Liver Function Tests:  Recent Labs Lab 11/05/15 0808 11/06/15 0551 11/07/15 0624 11/08/15 0602 11/09/15 0805  AST 21 19 22 20 17   ALT 57* 46 45 37 31  ALKPHOS 147* 134* 139* 130* 124  BILITOT 2.8* 2.4* 2.2* 2.1* 1.9*   PROT 4.8* 5.1* 5.5* 5.7* 5.6*  ALBUMIN 1.8* 2.0* 2.2* 2.3* 2.3*   No results for input(s): LIPASE, AMYLASE in the last 168 hours. No results for input(s): AMMONIA in the last 168 hours. Coagulation Profile:  Recent Labs Lab 11/03/15 0751  INR 1.10   Cardiac Enzymes: No results for input(s): CKTOTAL, CKMB, CKMBINDEX, TROPONINI in the last 168 hours. BNP (last 3 results) No results for input(s): PROBNP in the last 8760 hours. HbA1C: No results for input(s): HGBA1C in the last 72 hours. CBG: No results for input(s): GLUCAP in the last 168 hours. Lipid Profile: No results for input(s): CHOL, HDL, LDLCALC, TRIG, CHOLHDL, LDLDIRECT in the last 72 hours. Thyroid Function Tests: No results for input(s): TSH, T4TOTAL, FREET4, T3FREE, THYROIDAB in the last 72 hours. Anemia Panel: No results for input(s): VITAMINB12, FOLATE, FERRITIN, TIBC, IRON, RETICCTPCT in the last 72 hours. Sepsis Labs: No results for input(s): PROCALCITON, LATICACIDVEN in the last 168 hours.  Recent Results (from the past 240 hour(s))  Culture, blood (routine x 2)     Status: Abnormal   Collection Time: 10/31/15  6:40 AM  Result Value Ref Range Status   Specimen Description BLOOD LEFT HAND  Final   Special Requests IN PEDIATRIC BOTTLE 0.1CC  Final   Culture  Setup Time   Final    GRAM VARIABLE COCCOBACILLI AEROBIC BOTTLE ONLY CRITICAL RESULT CALLED TO, READ BACK BY AND VERIFIED WITH: C BALL,PHARMD AT 1301 11/05/15 BY L BENFIELD    Culture MORAXELLA SPECIES BETA LACTAMASE NEGATIVE  (A)  Final   Report Status 11/08/2015 FINAL  Final  Blood Culture ID Panel (Reflexed)     Status: None   Collection Time: 10/31/15  6:40 AM  Result Value Ref Range Status   Enterococcus species NOT DETECTED NOT DETECTED Final   Vancomycin resistance NOT DETECTED NOT DETECTED Final   Listeria monocytogenes NOT DETECTED NOT DETECTED Final   Staphylococcus species NOT DETECTED NOT DETECTED Final   Staphylococcus aureus NOT  DETECTED NOT DETECTED Final   Methicillin resistance NOT DETECTED NOT DETECTED Final   Streptococcus species NOT DETECTED NOT DETECTED Final   Streptococcus agalactiae NOT DETECTED NOT DETECTED Final   Streptococcus pneumoniae NOT DETECTED NOT DETECTED Final   Streptococcus pyogenes NOT DETECTED NOT DETECTED Final   Acinetobacter baumannii NOT DETECTED NOT DETECTED Final   Enterobacteriaceae species NOT DETECTED NOT DETECTED Final   Enterobacter cloacae complex NOT DETECTED NOT DETECTED Final   Escherichia coli NOT DETECTED NOT DETECTED Final   Klebsiella oxytoca NOT DETECTED NOT DETECTED Final   Klebsiella pneumoniae NOT DETECTED NOT DETECTED Final   Proteus species NOT DETECTED NOT DETECTED Final   Serratia marcescens NOT DETECTED NOT DETECTED Final  Carbapenem resistance NOT DETECTED NOT DETECTED Final   Haemophilus influenzae NOT DETECTED NOT DETECTED Final   Neisseria meningitidis NOT DETECTED NOT DETECTED Final   Pseudomonas aeruginosa NOT DETECTED NOT DETECTED Final   Candida albicans NOT DETECTED NOT DETECTED Final   Candida glabrata NOT DETECTED NOT DETECTED Final   Candida krusei NOT DETECTED NOT DETECTED Final   Candida parapsilosis NOT DETECTED NOT DETECTED Final   Candida tropicalis NOT DETECTED NOT DETECTED Final  Culture, blood (routine x 2)     Status: None   Collection Time: 10/31/15  6:53 AM  Result Value Ref Range Status   Specimen Description BLOOD LEFT HAND  Final   Special Requests IN PEDIATRIC BOTTLE 0.2CC  Final   Culture NO GROWTH 5 DAYS  Final   Report Status 11/05/2015 FINAL  Final  Culture, body fluid-bottle     Status: Abnormal (Preliminary result)   Collection Time: 11/03/15  2:55 PM  Result Value Ref Range Status   Specimen Description FLUID BILE  Final   Special Requests BOTTLES DRAWN AEROBIC AND ANAEROBIC 10CC  Final   Gram Stain   Final    GRAM VARIABLE ROD IN BOTH AEROBIC AND ANAEROBIC BOTTLES BOTTLE GROWTH CONSISTENT WITH ORIGINAL GRAM STAIN  RESULTS    Culture (A)  Final    KLEBSIELLA PNEUMONIAE ESCHERICHIA COLI Sent to [facility] for further susceptibility testing. CULTURE REINCUBATED FOR BETTER GROWTH    Report Status PENDING  Incomplete   Organism ID, Bacteria KLEBSIELLA PNEUMONIAE  Final      Susceptibility   Klebsiella pneumoniae - MIC*    AMPICILLIN 16 RESISTANT Resistant     CEFAZOLIN <=4 SENSITIVE Sensitive     CEFEPIME <=1 SENSITIVE Sensitive     CEFTAZIDIME <=1 SENSITIVE Sensitive     CEFTRIAXONE <=1 SENSITIVE Sensitive     CIPROFLOXACIN <=0.25 SENSITIVE Sensitive     GENTAMICIN <=1 SENSITIVE Sensitive     IMIPENEM <=0.25 SENSITIVE Sensitive     TRIMETH/SULFA <=20 SENSITIVE Sensitive     AMPICILLIN/SULBACTAM 4 SENSITIVE Sensitive     PIP/TAZO 8 SENSITIVE Sensitive     Extended ESBL NEGATIVE Sensitive     * KLEBSIELLA PNEUMONIAE  Gram stain     Status: None   Collection Time: 11/03/15  2:55 PM  Result Value Ref Range Status   Specimen Description FLUID BILE  Final   Special Requests NONE  Final   Gram Stain   Final    FEW WBC PRESENT, PREDOMINANTLY PMN MODERATE GRAM NEGATIVE RODS FEW GRAM POSITIVE RODS    Report Status 11/03/2015 FINAL  Final         Radiology Studies: Dg Ercp Biliary & Pancreatic Ducts  Result Date: 11/08/2015 CLINICAL DATA:  Biliary stone EXAM: ERCP TECHNIQUE: Multiple spot images obtained with the fluoroscopic device and submitted for interpretation post-procedure. COMPARISON:  Ultrasound fluoroscopic guided placement of a left biliary approach percutaneous drainage catheter - 11/03/2015; CT abdomen pelvis -10/29/2025 FINDINGS: Five spot intraoperative fluoroscopic images of the right upper abdominal quadrant during ERCP are provided for review. Initial image demonstrates an ERCP probe overlying the right upper abdominal quadrant. Additionally, there is a left hepatic approach biliary percutaneous drainage catheter also overlying the left upper abdominal quadrant. Subsequent images  demonstrate faint opacification of the markedly dilated common bile duct with multiple nonocclusive filling defects, potentially representative of biliary sludge Completion image demonstrates advancement of the percutaneous biliary drainage catheter with radiopaque tip overlying the expected location of the cranial aspect of the CBD. IMPRESSION: ERCP  as detailed above. These images were submitted for radiologic interpretation only. Please see the procedural report for the amount of contrast and the fluoroscopy time utilized. Electronically Signed   By: Simonne Come M.D.   On: 11/08/2015 13:28        Scheduled Meds: . amLODipine  5 mg Oral Daily  . ampicillin-sulbactam (UNASYN) IV  3 g Intravenous Q6H  . arformoterol  15 mcg Nebulization BID  . aspirin EC  81 mg Oral QHS  . atenolol  25 mg Oral Daily  . budesonide (PULMICORT) nebulizer solution  0.25 mg Nebulization BID  . cholestyramine  4 g Oral BID  . gabapentin  300 mg Oral BID  . pantoprazole  40 mg Oral Daily  . potassium chloride  10 mEq Oral Daily  . sodium phosphate  Dextrose 5% IVPB  15 mmol Intravenous Once   Continuous Infusions:     LOS: 9 days     Braylin Formby, MD [facility] Pager [phone]  If 7PM-7AM, please contact night-coverage [email or web address] Password [facility] 11/09/2015, 2:44 PM

## 2015-11-10 LAB — COMPREHENSIVE METABOLIC PANEL
ALBUMIN: 2.3 g/dL — AB (ref 3.5–5.0)
ALK PHOS: 112 U/L (ref 38–126)
ALT: 28 U/L (ref 14–54)
AST: 14 U/L — AB (ref 15–41)
Anion gap: 8 (ref 5–15)
BILIRUBIN TOTAL: 1.8 mg/dL — AB (ref 0.3–1.2)
CALCIUM: 8.4 mg/dL — AB (ref 8.9–10.3)
CO2: 25 mmol/L (ref 22–32)
Chloride: 100 mmol/L — ABNORMAL LOW (ref 101–111)
Creatinine, Ser: 0.65 mg/dL (ref 0.44–1.00)
GFR calc Af Amer: >60 mL/min (ref >60)
GFR calc non Af Amer: >60 mL/min (ref >60)
GLUCOSE: 96 mg/dL (ref 65–99)
Potassium: 4 mmol/L (ref 3.5–5.1)
SODIUM: 133 mmol/L — AB (ref 135–145)
TOTAL PROTEIN: 5.8 g/dL — AB (ref 6.5–8.1)

## 2015-11-10 MED ORDER — AMOXICILLIN-POT CLAVULANATE 875-125 MG PO TABS: 1.0000 | ORAL_TABLET | Freq: Two times a day (BID) | ORAL | 0 refills | Status: DC

## 2015-11-10 MED ORDER — ACYCLOVIR 5 % EX OINT
TOPICAL_OINTMENT | CUTANEOUS | Status: DC
  Administered 2015-11-10 (×2): via TOPICAL
  Filled 2015-11-10: qty 5

## 2015-11-10 NOTE — Progress Notes (Signed)
Patient ID: Amy ShanksGlenda Gangl, female   DOB: [birth year], 75 y.o.   MRN: [id number]   Discussed with Dr Loreta AveWagner Capped drain now  Will see pt in [facility] IR [facility] in 7-10 days  If sxs of abd pain; jaundice; fever--- Return to ED for evaluation  Pt has good understanding of plan   Dr Dulce Sellarutlaw and Dr Ewing SchleinMagod will follow pt Plan for [facility] eval soon

## 2015-11-10 NOTE — Progress Notes (Signed)
Patient was discharged home by MD order; discharged instructions  review and give to patient with care notes and prescriptions; IV DIC; patient will be escorted to the car by nurse tech via wheelchair.  

## 2015-11-10 NOTE — Discharge Summary (Signed)
Amy Sheppard, is a 75 y.o. female  DOB 1940/07/09  MRN [id number].  Admission date:  10/30/2015  Admitting Physician  Shon Hale, MD  Discharge Date:  11/10/2015   Primary MD  Gildardo Griffes, MD  Recommendations for primary care physician for things to follow:  - Please check CBC, CMP during next visit - Patient to follow with [facility], fetal GI will arrange for outpatient consult  - Patient to follow with IR in 7-10 days.   Admission Diagnosis  Dehydration [E86.0] Common bile duct (CBD) obstruction [K83.1]   Discharge Diagnosis  Dehydration [E86.0] Common bile duct (CBD) obstruction [K83.1]    Principal Problem:   Obstructive jaundice Active Problems:   Calculus of bile duct with obstruction and without cholangitis or cholecystitis   Common bile duct dilatation   Transaminitis   Hypophosphatemia   Hypokalemia   Wheezing   Essential hypertension   Dehydration   SOB (shortness of breath)      Past Medical History:  Diagnosis Date  . GERD (gastroesophageal reflux disease)   . High cholesterol   . Hypertension   . Lymphocytic colitis   . Migraine    "never anymore; it's been awhile since I've had one" (11/01/2015)  . UTI (lower urinary tract infection) X 1    Past Surgical History:  Procedure Laterality Date  . ERCP N/A 11/08/2015   Procedure: ENDOSCOPIC RETROGRADE CHOLANGIOPANCREATOGRAPHY (ERCP);  Surgeon: Vida Rigger, MD;  Location: [facility] ENDOSCOPY;  Service: Endoscopy;  Laterality: N/A;  . ESOPHAGOGASTRODUODENOSCOPY N/A 11/02/2015   Procedure: ESOPHAGOGASTRODUODENOSCOPY (EGD);  Surgeon: Vida Rigger, MD;  Location: [facility] ENDOSCOPY;  Service: Endoscopy;  Laterality: N/A;  . IR GENERIC HISTORICAL  11/03/2015   IR INT EXT BILIARY DRAIN WITH CHOLANGIOGRAM 11/03/2015 Richarda Overlie, MD [facility]-INTERV RAD  . SPYGLASS CHOLANGIOSCOPY N/A 11/08/2015   Procedure: SPYGLASS CHOLANGIOSCOPY;  Surgeon: Vida Rigger, MD;   Location: [facility] ENDOSCOPY;  Service: Endoscopy;  Laterality: N/A;  . TUBAL LIGATION  1970s       History of present illness and  Hospital Course:     Kindly see H&P for history of present illness and admission details, please review complete Labs, Consult reports and Test reports for all details in brief  HPI  from the history and physical done on the day of admission 10/30/2015  HPI: Amy Sheppard is a 75 y.o. female with medical history significant of hypertension who presents with 5 day complaint of poor oral intake. Started on Wednesday night when patient had nausea and emesis. Has felt weak since. Has had poor oral intake as well. At first she thought it was something she ate Wednesday but the problem has persisted. Given her progressive weakness she decided to come to the hospital for further evaluation recommendations. Nothing she is aware of makes it better eating makes it worse. The problem occurred insidiously.  ED Course: Patient had CT scan which reported obstructive thing hyperdense mass versus stone in the common bile duct with associated bladder distention. Please see full CT scan result  Hospital Course  Amy Bellamyis a 75 y.o.femalewith medical history significant of hypertension who presents with 5 day complaint of poor oral intake. Started on Wednesday night when patient had nausea and emesis. Has felt weak since. Has had poor oral intake as well. At first she thought it was something she ate Wednesday but the problem has persisted. Given her progressive weakness she decided to come to the hospital for further evaluation recommendations.  Patient was assessed noted to be jaundiced with a transaminitis and noted to have biliary duct dilatation. GI was consulted and patient underwent an unsuccessful ERCP. Patient was subsequently seen by interventional radiology and underwent percutaneous hepatic cholangiogram with drain placement and fluid obtained positive for Klebsiella  pneumonia. Patient placed empirically on IV Unasyn. It was noted patient likely had choledocholithiasis. Patient improved clinically LFTs trended down. Patient underwent repeat ERCP 11/08/2015 Gastroenterology discussed with patient unable option, and [facility].  #1 obstructive jaundice secondary to common bile duct stones/choledocholithiasis Patient had presented with nausea, vomiting noted to be jaundiced. Patient noted to have a transaminitis. CT abdomen and pelvis with an obstructing hyperdense mass versus stone in the common bile duct measuring 1.2 cm causing market CBD dilatation and intrahepatic bile duct dilatation. LFTs trending down. Patient has been assessed by GI and status post failed ERCP with balloon dilatation of distal duodenum and diagnostic endoscopic procedure 11/02/2015. Patient was seen in consultation by interventional radiology and subsequently underwent percutaneous transhepatic cholangiogram with left internal/external biliary drain and fluid sent for culture and cytology. Findings on PT cholangiogram did show severe biliary dilatation with multiple large filling defects in the, bile duct consistent with stones. Patient tolerating full liquid diet. Culture results from drain positive for Klebsiella pneumonia sensitive to Unasyn. Continue Unasyn. CT abdomen and pelvis on admission did show gallbladder distention containing small amount of layering stones and/or sludge. GI and IR following and appreciate input and recommendations. Patient s/p repeat ERCP today 11/08/2015 which showed major papillar entirely within diverticulum. Choledocholithiasis found. Partial removal was accomplished by biliary sphincterotomy and lithotripsy and balloon dilatation no stent inserted. Biliary sphincterotomy was performed., Bile duct distally was successfully dilated. Biliary tree was swept and debris was found. GI to discussed various options with  patient , and she wants to follow with [facility] as an outpatient, cecal GI to arrange for outpatient consult. - Was on IV Unasyn during hospital stay, will be discharged on another 4 days of by mouth Augmentin to finish total of 2 weeks treatment for biliary culture growing Klebsiella pneumonia and Escherichia coli - blood culture growing Moraxella species Beta lactamase negative, discussed with ID, should be covered with IV Unasyn during hospital stay, and oral Augmentin as an outpatient for total of 2 weeks. - Patient had her biliary drain capped on discharge by IR, and to follow as an outpatient with IR in 7-10 days.  #2 hypokalemia/hypophosphatemia Likely secondary to GI losses. Repleted.   #3 transaminitis Likely secondary to problem #1. LFTs trending down. Bilirubin trending back down. Patient s/p failed ERCP 11/02/2015 with balloon dilatation of distal duodenum and diagnostic endoscopy procedure. Patient underwent successful percutaneous transhepatic cholangiogram with internal/external biliary drain placement. Interventional radiology. Culture results from drain positive for Klebsiella pneumonia which is sensitive to Unasyn. Monitor LFTs closely. Pt s/p repeat ERCP 11/08/2015 with choledocholiathiasis, partial removal via biliary sphincterotomy and lithotripsy and balloon dilatation. GI and IR following and appreciate input and recommendations.   #4 wheezing Patient noted to  be wheezing on examination 11/01/15, which has since resolved with a dose of IV Solu-Medrol,   #5 leukocytosis Patient with a worsening leukocytosis with a white count of 16,000 (11/02/2015) from 11,000. Leukocytosis improved and white count currently at 6.9. Likely secondary to Klebsiella pneumonia which grew out of drain culture and IV Solu-Medrol dose which was given on 11/01/2015. Patient currently on IV Unasyn. Follow.  #6 hypertension Continue home regimen of atenolol , hold hydrochlorothiazide given  acceptable blood pressure during hospital stay    Discharge Condition:  Stable  Follow UP  Follow-up Information    BURNS,KEVIN, MD. Schedule an appointment as soon as possible for a visit on 11/11/2015.   Specialty:  Internal Medicine Why:  Appointment with Dr. Lawerance Bach is on 11/11/15 at 12 Contact information: [address] [place] [address] [phone]        Gilmer Mor, DO .   Specialty:  Interventional Radiology Why:  pt will hear from IR OP drain clinic for follow up Contact information: [address] [place] [address] [phone]             Discharge Instructions  and  Discharge Medications    Discharge Instructions    Diet - low sodium heart healthy    Complete by:  As directed   Increase activity slowly    Complete by:  As directed       Medication List    STOP taking these medications   acetaminophen 325 MG tablet Commonly known as:  TYLENOL   hydrochlorothiazide 12.5 MG tablet Commonly known as:  HYDRODIURIL     TAKE these medications   amoxicillin-clavulanate 875-125 MG tablet Commonly known as:  AUGMENTIN Take 1 tablet by mouth 2 (two) times daily.   aspirin EC 81 MG tablet Take 81 mg by mouth at bedtime.   atenolol 25 MG tablet Commonly known as:  TENORMIN Take 25 mg by mouth daily.   gabapentin 300 MG capsule Commonly known as:  NEURONTIN Take 300 mg by mouth 2 (two) times daily.   omeprazole 40 MG capsule Commonly known as:  PRILOSEC Take 40 mg by mouth daily.   potassium chloride 10 MEQ tablet Commonly known as:  K-DUR,KLOR-CON Take 10 mEq by mouth daily.   simvastatin 20 MG tablet Commonly known as:  ZOCOR Take 20 mg by mouth daily at 6 PM.   Vitamins/Minerals Tabs Take 1 tablet by mouth daily.         Diet and Activity recommendation: See Discharge Instructions above   Consults obtained -  GI Interventional radiology   Major procedures and Radiology Reports -  PLEASE review detailed and final reports for all details, in brief -   CT abdomen and pelvis 10/30/2015  Chest x-ray 11/01/2015  Unsuccessful ERCP 11/02/2015 per Dr.Magod  Percutaneous chance hepatic cholangiogram per Dr. Lowella Dandy 05/06/2015  ERCP 11/08/2015--Per Dr Ewing Schlein   Dg Chest 2 View  Result Date: 11/01/2015 CLINICAL DATA:  Patient minute 5 days ago for vomiting and weakness EXAM: CHEST  2 VIEW COMPARISON:  March 21, 2012 FINDINGS: The heart size and mediastinal contours are within normal limits. Both lungs are clear. The visualized skeletal structures are unremarkable. IMPRESSION: No active cardiopulmonary disease. Electronically Signed   By: Gerome Sam III M.D   On: 11/01/2015 14:28   Ct Abdomen Pelvis W Contrast  Result Date: 10/30/2015 CLINICAL DATA:  Persistent vomiting for 8-10 hours 4 days ago. Now weekend dehydrated. Elevated liver function test. EXAM:  CT ABDOMEN AND PELVIS WITH CONTRAST TECHNIQUE: Multidetector CT imaging of the abdomen and pelvis was performed using the standard protocol following bolus administration of intravenous contrast. CONTRAST:  ISOVUE-300 IOPAMIDOL (ISOVUE-300) INJECTION 61% COMPARISON:  None. FINDINGS: Lower chest:  No acute findings. Hepatobiliary: Obstructing mass versus stone in the common bile duct, measuring approximately 1.2 cm, causing marked common bile duct and intrahepatic bile duct dilatation. Gallbladder is moderately distended. Layering stones versus sludge in the gallbladder. No pericholecystic inflammation seen. Pancreas: No pancreatic duct dilatation. No peripancreatic fluid or inflammation. Spleen: Within normal limits in size and appearance. Adrenals/Urinary Tract: Hyperdense mass exophytic to the posterior cortex of the left kidney measures 1.7 x 1.5 cm. No left-sided renal stone or hydronephrosis. Right kidney appears normal without mass, stone or hydronephrosis. No ureteral or bladder calculi identified. Bladder appears normal.  Stomach/Bowel: Bowel is normal in caliber. Scattered diverticulosis throughout the colon without evidence of acute diverticulitis. No evidence of bowel wall inflammation. Stomach appears normal. Appendix is normal. Vascular/Lymphatic: Atherosclerotic changes of the normal caliber abdominal aorta. Mildly prominent lymph nodes within the upper abdomen. Reproductive: Cystic mass within the left adnexa, presumably ovarian, measuring 3.5 x 3.5 cm. Right adnexal region is unremarkable. Other: No free fluid or abscess collections seen. No free intraperitoneal air. Musculoskeletal: Degenerative changes of the thoracolumbar spine, moderate in degree. No acute or suspicious osseous finding. Superficial soft tissues are unremarkable. IMPRESSION: 1. Obstructing hyperdense mass versus stone in the common bile duct, measuring approximately 1.2 cm greatest dimension, causing marked CBD dilatation and intrahepatic bile duct dilatation. No pancreatic duct dilatation, therefore, obstructing stone versus mass is above the level of the pancreatic duct. Recommend GI consultation for possible ERCP. 2. Associated gallbladder distension, containing a small amount of layering stones and/or sludge. No pericholecystic fluid. 3. Mildly prominent lymph nodes within the upper abdomen, largest in the porta hepatis and portacaval regions. These could be reactive or neoplastic in nature, depending on the cause for the CBD obstruction. 4. Cystic mass in the left adnexa, measuring 3.5 x 3.5 cm. Given patient's age, recommend pelvic ultrasound after current issues are resolved to exclude cystic neoplasm of the left ovary. 5. Hyperdense mass exophytic to the posterior cortex of the left kidney, measuring 1.7 x 1.5 cm. Solid neoplastic mass cannot be excluded based on this single exam. Recommend renal MRI for further characterization after current issues are resolved. 6. Colonic diverticulosis without evidence of acute diverticulitis. 7. Aortic  atherosclerosis. These results and recommendations were called by telephone at the time of interpretation on 10/30/2015 at 12:33 pm to Dr. Shawna Orleans BELFI , who verbally acknowledged these results. Electronically Signed   By: Bary Richard M.D.   On: 10/30/2015 12:35   Dg Ercp Biliary & Pancreatic Ducts  Result Date: 11/08/2015 CLINICAL DATA:  Biliary stone EXAM: ERCP TECHNIQUE: Multiple spot images obtained with the fluoroscopic device and submitted for interpretation post-procedure. COMPARISON:  Ultrasound fluoroscopic guided placement of a left biliary approach percutaneous drainage catheter - 11/03/2015; CT abdomen pelvis -10/29/2025 FINDINGS: Five spot intraoperative fluoroscopic images of the right upper abdominal quadrant during ERCP are provided for review. Initial image demonstrates an ERCP probe overlying the right upper abdominal quadrant. Additionally, there is a left hepatic approach biliary percutaneous drainage catheter also overlying the left upper abdominal quadrant. Subsequent images demonstrate faint opacification of the markedly dilated common bile duct with multiple nonocclusive filling defects, potentially representative of biliary sludge Completion image demonstrates advancement of the percutaneous biliary drainage catheter with radiopaque  tip overlying the expected location of the cranial aspect of the CBD. IMPRESSION: ERCP as detailed above. These images were submitted for radiologic interpretation only. Please see the procedural report for the amount of contrast and the fluoroscopy time utilized. Electronically Signed   By: Simonne ComeJohn  Watts M.D.   On: 11/08/2015 13:28   Dg C-arm 61-120 Min-no Report  Result Date: 11/02/2015 CLINICAL DATA: obstructive jaundice C-ARM 61-120 MINUTES Fluoroscopy was utilized by the requesting physician.  No radiographic interpretation.   Ir Int Ezzard StandingExt Biliary Drain With Cholangiogram  Result Date: 11/03/2015 INDICATION: 75 year old with obstructive jaundice. CT  imaging demonstrates an obstruction in the common bile duct. Unclear if this is related to a stone or tumor. Unfortunately, biliary system could not be decompressed or further evaluated from an endoscopic approach. Patient needs biliary decompression and diagnostic imaging of the biliary system. EXAM: PERCUTANEOUS TRANSHEPATIC CHOLANGIOGRAM WITH ULTRASOUND AND FLUOROSCOPIC GUIDANCE PLACEMENT OF INTERNAL/ EXTERNAL BILIARY DRAIN MEDICATIONS: Unasyn 3 g; The antibiotic was administered within an appropriate time frame prior to the initiation of the procedure. ANESTHESIA/SEDATION: Moderate (conscious) sedation was employed during this procedure. A total of Versed 2.0 mg and Fentanyl 75 mcg was administered intravenously. Moderate Sedation Time: 35 minutes. The patient's level of consciousness and vital signs were monitored continuously by radiology nursing throughout the procedure under my direct supervision. FLUOROSCOPY TIME:  Fluoroscopy Time: 8 minutes, 48 mGy COMPLICATIONS: None immediate. PROCEDURE: Informed written consent was obtained from the patient after a thorough discussion of the procedural risks, benefits and alternatives. All questions were addressed. Maximal Sterile Barrier Technique was utilized including caps, mask, sterile gowns, sterile gloves, sterile drape, hand hygiene and skin antiseptic. A timeout was performed prior to the initiation of the procedure. Patient was placed supine on the interventional table. The anterior abdomen was prepped and draped in a sterile fashion. Dilated intrahepatic ducts identified with ultrasound. Skin was anesthetized with 1% lidocaine. Using ultrasound guidance, a 22 gauge needle was directed into a dilated intrahepatic duct in the left hepatic lobe. Contrast injection confirmed placement in the biliary system. Wire would not advance from this access due to excessive patient breathing. Needle was redirected slightly more central at a different angle. Contrast was  again injected and confirmed placement in the biliary system. A wire was successfully advanced into the central biliary system. Tract was dilated with an Accustick dilator set. A 5 [place] the catheter was advanced beyond the common bile duct obstruction using a Glidewire. Glidewire and catheter were advanced into the duodenum. Tract was dilated to accommodate a 10 [place] biliary drain. The distal aspect was advanced into the duodenum. Biliary sample was sent for cytology and culture. Small amount of contrast was injected to confirm placement. Catheter was sutured to skin. Dressing placed over the catheter. FINDINGS: Severe intrahepatic and extrahepatic biliary dilatation. Access obtained from a dilated branch in the lateral left hepatic lobe. At least 3 large intraluminal filling defects compatible with stones. There are additional smaller stones in the common bile duct and dilated cystic duct. Drainage catheter was successfully advanced into the duodenum. IMPRESSION: Successful percutaneous transhepatic cholangiogram and placement of internal/external biliary drain. Drain was placed from a left hepatic approach. Multiple large filling defects in the extrahepatic biliary system are compatible with stones. PLAN: Follow drain output and lab values. Will need to coordinate stone management with GI. Electronically Signed   By: Richarda OverlieAdam  Henn M.D.   On: 11/03/2015 19:23    Micro Results    Recent Results (from  the past 240 hour(s))  Culture, body fluid-bottle     Status: Abnormal (Preliminary result)   Collection Time: 11/03/15  2:55 PM  Result Value Ref Range Status   Specimen Description FLUID BILE  Final   Special Requests BOTTLES DRAWN AEROBIC AND ANAEROBIC 10CC  Final   Gram Stain   Final    GRAM VARIABLE ROD IN BOTH AEROBIC AND ANAEROBIC BOTTLES BOTTLE GROWTH CONSISTENT WITH ORIGINAL GRAM STAIN RESULTS    Culture (A)  Final    KLEBSIELLA PNEUMONIAE ESCHERICHIA COLI Sent to [facility] for further  susceptibility testing. CULTURE REINCUBATED FOR BETTER GROWTH    Report Status PENDING  Incomplete   Organism ID, Bacteria KLEBSIELLA PNEUMONIAE  Final      Susceptibility   Klebsiella pneumoniae - MIC*    AMPICILLIN 16 RESISTANT Resistant     CEFAZOLIN <=4 SENSITIVE Sensitive     CEFEPIME <=1 SENSITIVE Sensitive     CEFTAZIDIME <=1 SENSITIVE Sensitive     CEFTRIAXONE <=1 SENSITIVE Sensitive     CIPROFLOXACIN <=0.25 SENSITIVE Sensitive     GENTAMICIN <=1 SENSITIVE Sensitive     IMIPENEM <=0.25 SENSITIVE Sensitive     TRIMETH/SULFA <=20 SENSITIVE Sensitive     AMPICILLIN/SULBACTAM 4 SENSITIVE Sensitive     PIP/TAZO 8 SENSITIVE Sensitive     Extended ESBL NEGATIVE Sensitive     * KLEBSIELLA PNEUMONIAE  Gram stain     Status: None   Collection Time: 11/03/15  2:55 PM  Result Value Ref Range Status   Specimen Description FLUID BILE  Final   Special Requests NONE  Final   Gram Stain   Final    FEW WBC PRESENT, PREDOMINANTLY PMN MODERATE GRAM NEGATIVE RODS FEW GRAM POSITIVE RODS    Report Status 11/03/2015 FINAL  Final       Today   Subjective:   Amy Sheppard today has no headache,no chest or abdominal pain,no new weakness tingling or numbness, feels much better wants to go home today.   Objective:   Blood pressure (!) 108/59, pulse 77, temperature 98 F (36.7 C), temperature source Oral, resp. rate 18, height 5\' 2"  (1.575 m), weight 81.2 kg (179 lb), SpO2 97 %.   Intake/Output Summary (Last 24 hours) at 11/10/15 1500 Last data filed at 11/10/15 1300  Gross per 24 hour  Intake             1100 ml  Output             1825 ml  Net             -725 ml    Exam General exam: Appears calm and comfortable. Respiratory system: CTAB. Respiratory effort normal. Cardiovascular system: S1 & S2 heard, RRR. No JVD, murmurs, rubs, gallops or clicks. No pedal edema. Gastrointestinal system: Abdomen is nondistended, soft and nontender. No organomegaly or masses felt. Normal  bowel sounds heard. Drain with biliary drainage noted. Central nervous system: Alert and oriented. No focal neurological deficits. Extremities: Symmetric 5 x 5 power. Skin: No rashes, lesions or ulcers Psychiatry: Judgement and insight appear normal. Mood & affect appropriate.    Data Review   CBC w Diff:  Lab Results  Component Value Date   WBC 9.4 11/09/2015   HGB 9.9 (L) 11/09/2015   HCT 31.7 (L) 11/09/2015   PLT 373 11/09/2015   LYMPHOPCT 13 11/07/2015   MONOPCT 9 11/07/2015   EOSPCT 3 11/07/2015   BASOPCT 1 11/07/2015    CMP:  Lab Results  Component  Value Date   NA 133 (L) 11/10/2015   K 4.0 11/10/2015   CL 100 (L) 11/10/2015   CO2 25 11/10/2015   BUN <5 (L) 11/10/2015   CREATININE 0.65 11/10/2015   PROT 5.8 (L) 11/10/2015   ALBUMIN 2.3 (L) 11/10/2015   BILITOT 1.8 (H) 11/10/2015   ALKPHOS 112 11/10/2015   AST 14 (L) 11/10/2015   ALT 28 11/10/2015  .   Total Time in preparing paper work, data evaluation and todays exam - 35 minutes  ELGERGAWY, DAWOOD M.D on 11/10/2015 at 3:00 PM  [facility]   Office  [phone]

## 2015-11-10 NOTE — Care Management Note (Signed)
Case Management Note  Patient Details  Name: Amy Sheppard MRN: [id number] Date of Birth: 1940-12-07  Subjective/Objective:                Admitted with hepatic encephalopathy.    Action/Plan: Plan is to d/c to home today with home health services in place [facility]( [facility]HRN).  Expected Discharge Date:    11/10/2015        Expected Discharge Plan:  Home w Home Health Services  In-House Referral:     Discharge planning Services  CM Consult  Post Acute Care Choice:    Choice offered to:  Patient  DME Arranged:    DME Agency:     HH Arranged:  RN HH Agency:  [facility]/ referral made with Lupita LeashDonna @ [phone].  Status of Service:  Completed, signed off  If discussed at Long Length of Stay Meetings, dates discussed:    Additional Comments:  Epifanio LeschesCole, Gorje Iyer Hudson, RN 11/10/2015, 3:25 PM

## 2015-11-10 NOTE — Care Management Important Message (Signed)
Important Message  Patient Details  Name: Amy ShanksGlenda Sheppard MRN: [id number] Date of Birth: 06-Oct-1940   Medicare Important Message Given:  Yes    Kyla BalzarineShealy, Shyteria Lewis Abena 11/10/2015, 11:39 AM

## 2015-11-10 NOTE — Progress Notes (Signed)
Physical Therapy Treatment Patient Details Name: Amy ShanksGlenda Sheppard MRN: [id number] DOB: 01-22-1941 Today's Date: 11/10/2015    History of Present Illness This 75 year old female was admitted with nausea and poor intake and found to have obstructive jaundice. She is s/p biliary drain.  H/O HTNs/p ECRP on 11/08/15.    PT Comments    Pt is progressing well with her gait and mobility. I agree with her, once she gets home and in her normal routine, she will get stronger and more steady on her feet.  HEP program provided to help speed up the process once home.  Pt is physically safe to return home once MDs clear her to do so.  PT to follow acutely until d/c confirmed.     Follow Up Recommendations  No PT follow up;Supervision - Intermittent;Supervision for mobility/OOB     Equipment Recommendations  None recommended by PT    Recommendations for Other Services   NA     Precautions / Restrictions Precautions Precautions: Fall Precaution Comments: unsteady on her feet Restrictions Weight Bearing Restrictions: No    Mobility  Bed Mobility               General bed mobility comments: Pt OOB in chair  Transfers Overall transfer level: Modified independent Equipment used: None Transfers: Sit to/from Stand           General transfer comment: Pt uses hands for safe transitions  Ambulation/Gait Ambulation/Gait assistance: Min guard Ambulation Distance (Feet): 250 Feet Assistive device: None Gait Pattern/deviations: Step-through pattern;Staggering right;Staggering left     General Gait Details: pt with mildly staggering gait pattern, reaching for external supports at times.  Min guard assist for steadying during gait.     Stairs Stairs: Yes Stairs assistance: Supervision Stair Management: One rail Right;Alternating pattern;Forwards Number of Stairs: 4 General stair comments: Pt safely and easily ascended and descended stairs with us of railing.          Balance  Overall balance assessment: Needs assistance Sitting-balance support: Feet supported;No upper extremity supported Sitting balance-Leahy Scale: Good     Standing balance support: No upper extremity supported Standing balance-Leahy Scale: Good                      Cognition Arousal/Alertness: Awake/alert Behavior During Therapy: WFL for tasks assessed/performed Overall Cognitive Status: Within Functional Limits for tasks assessed                      Exercises General Exercises - Lower Extremity Long Arc Quad: AROM;Both;10 reps Hip ABduction/ADduction: AROM;Both;10 reps Hip Flexion/Marching: AROM;Both;10 reps Toe Raises: AROM;Both;20 reps Heel Raises: AROM;Both;20 reps        Pertinent Vitals/Pain Pain Assessment: No/denies pain (reports sore at drain insertion site)           PT Goals (current goals can now be found in the care plan section) Acute Rehab PT Goals Patient Stated Goal: home Progress towards PT goals: Progressing toward goals    Frequency  Min 3X/week    PT Plan Current plan remains appropriate       End of Session Equipment Utilized During Treatment: Gait belt Activity Tolerance: Patient tolerated treatment well Patient left: in chair;with call bell/phone within reach     Time: 1139-1159 PT Time Calculation (min) (ACUTE ONLY): 20 min  Charges:  $Gait Training: 8-22 mins  Rollene Rotundaebecca B. Nell Gales, PT, DPT [phone]   11/10/2015, 12:08 PM

## 2015-11-10 NOTE — Progress Notes (Signed)
Referring Physician(s): Dr Phillips Climes  Supervising Physician: Corrie Mckusick  Patient Status:  In patient  Chief Complaint:  Obstructive jaundice PTC with Int/ext biliary drain placement 8/10  Subjective:  Up in chair No complaints Eating better daily Denies N/V Plans to seek opinion of [facility]   Allergies: Tetanus toxoids  Medications: Prior to Admission medications   Medication Sig Start Date End Date Taking? Authorizing Provider  acetaminophen (TYLENOL) 325 MG tablet Take 325 mg by mouth every 6 (six) hours as needed for headache.   Yes Historical Provider, MD  aspirin EC 81 MG tablet Take 81 mg by mouth at bedtime.   Yes Historical Provider, MD  atenolol (TENORMIN) 25 MG tablet Take 25 mg by mouth daily.   Yes Historical Provider, MD  gabapentin (NEURONTIN) 300 MG capsule Take 300 mg by mouth 2 (two) times daily. 06/09/15 06/08/16 Yes Historical Provider, MD  hydrochlorothiazide (HYDRODIURIL) 12.5 MG tablet Take 12.5 mg by mouth daily.   Yes Historical Provider, MD  omeprazole (PRILOSEC) 40 MG capsule Take 40 mg by mouth daily.   Yes Historical Provider, MD  potassium chloride (K-DUR,KLOR-CON) 10 MEQ tablet Take 10 mEq by mouth daily.   Yes Historical Provider, MD  simvastatin (ZOCOR) 20 MG tablet Take 20 mg by mouth daily at 6 PM.   Yes Historical Provider, MD  Vitamins/Minerals TABS Take 1 tablet by mouth daily.   Yes Historical Provider, MD     Vital Signs: BP 122/75 (BP Location: Right Arm)   Pulse 81   Temp 98.4 F (36.9 C) (Oral)   Resp 18   Ht '5\' 2"'$  (1.575 m)   Wt 179 lb (81.2 kg)   SpO2 96%   BMI 32.74 kg/m   Physical Exam  Constitutional: She is oriented to person, place, and time.  Abdominal: Soft. Bowel sounds are normal. There is no tenderness.  Neurological: She is alert and oriented to person, place, and time.  Skin: Skin is warm and dry.  Skin site is clean and dry NT No bleeding Output bilious 950 cc yesterday 50 cc in  bag  Psychiatric: She has a normal mood and affect. Her behavior is normal.    Imaging: Dg Ercp Biliary & Pancreatic Ducts  Result Date: 11/08/2015 CLINICAL DATA:  Biliary stone EXAM: ERCP TECHNIQUE: Multiple spot images obtained with the fluoroscopic device and submitted for interpretation post-procedure. COMPARISON:  Ultrasound fluoroscopic guided placement of a left biliary approach percutaneous drainage catheter - 11/03/2015; CT abdomen pelvis -10/29/2025 FINDINGS: Five spot intraoperative fluoroscopic images of the right upper abdominal quadrant during ERCP are provided for review. Initial image demonstrates an ERCP probe overlying the right upper abdominal quadrant. Additionally, there is a left hepatic approach biliary percutaneous drainage catheter also overlying the left upper abdominal quadrant. Subsequent images demonstrate faint opacification of the markedly dilated common bile duct with multiple nonocclusive filling defects, potentially representative of biliary sludge Completion image demonstrates advancement of the percutaneous biliary drainage catheter with radiopaque tip overlying the expected location of the cranial aspect of the CBD. IMPRESSION: ERCP as detailed above. These images were submitted for radiologic interpretation only. Please see the procedural report for the amount of contrast and the fluoroscopy time utilized. Electronically Signed   By: Sandi Mariscal M.D.   On: 11/08/2015 13:28    Labs:  CBC:  Recent Labs  11/05/15 0808 11/06/15 0551 11/07/15 0624 11/09/15 0805  WBC 8.9 6.9 6.9 9.4  HGB 8.6* 9.0* 9.7* 9.9*  HCT 26.1* 27.0*  30.2* 31.7*  PLT 259 307 346 373    COAGS:  Recent Labs  11/03/15 0751  INR 1.10  APTT 26    BMP:  Recent Labs  11/07/15 0624 11/08/15 0602 11/09/15 0805 11/10/15 0521  NA 134* 133* 133* 133*  K 3.7 3.9 3.9 4.0  CL 100* 99* 99* 100*  CO2 '26 27 25 25  '$ GLUCOSE 93 101* 99 96  BUN <5* <5* 5* <5*  CALCIUM 8.4* 8.6* 8.5*  8.4*  CREATININE 0.53 0.59 0.66 0.65  GFRNONAA >60 >60 >60 >60  GFRAA >60 >60 >60 >60    LIVER FUNCTION TESTS:  Recent Labs  11/07/15 0624 11/08/15 0602 11/09/15 0805 11/10/15 0521  BILITOT 2.2* 2.1* 1.9* 1.8*  AST '22 20 17 '$ 14*  ALT 45 37 31 28  ALKPHOS 139* 130* 124 112  PROT 5.5* 5.7* 5.6* 5.8*  ALBUMIN 2.2* 2.3* 2.3* 2.3*    Assessment and Plan:  Obstructive jaundice- stones PTC; I/E biliary drain intact IR can follow drain in OP Clinic Please call [phone]- LM for PA if need [place] to follow pt as OP Dependent on plan per [facility]  Electronically Signed: [name] A 11/10/2015, 11:40 AM   I spent a total of 15 Minutes at the the patient's bedside AND on the patient's hospital floor or unit, greater than 50% of which was counseling/coordinating care for I/E biliary drain

## 2015-11-10 NOTE — Discharge Instructions (Signed)
Follow with Primary MD Amy GriffesBURNS,KEVIN, MD in 7 days   Get CBC, CMP,  checked  by Primary MD next visit.    Activity: As tolerated with Full fall precautions use walker/cane & assistance as needed   Disposition Home    Diet: Heart Healthy  , with feeding assistance and aspiration precautions.  For Heart failure patients - Check your Weight same time everyday, if you gain over 2 pounds, or you develop in leg swelling, experience more shortness of breath or chest pain, call your Primary MD immediately. Follow Cardiac Low Salt Diet and 1.5 lit/day fluid restriction.   On your next visit with your primary care physician please Get Medicines reviewed and adjusted.   Please request your Prim.MD to go over all Hospital Tests and Procedure/Radiological results at the follow up, please get all Hospital records sent to your Prim MD by signing hospital release before you go home.   If you experience worsening of your admission symptoms, develop shortness of breath, life threatening emergency, suicidal or homicidal thoughts you must seek medical attention immediately by calling 911 or calling your MD immediately  if symptoms less severe.  You Must read complete instructions/literature along with all the possible adverse reactions/side effects for all the Medicines you take and that have been prescribed to you. Take any new Medicines after you have completely understood and accpet all the possible adverse reactions/side effects.   Do not drive, operating heavy machinery, perform activities at heights, swimming or participation in water activities or provide baby sitting services if your were admitted for syncope or siezures until you have seen by Primary MD or a Neurologist and advised to do so again.  Do not drive when taking Pain medications.    Do not take more than prescribed Pain, Sleep and Anxiety Medications  Special Instructions: If you have smoked or chewed Tobacco  in the last 2 yrs please  stop smoking, stop any regular Alcohol  and or any Recreational drug use.  Wear Seat belts while driving.   Please note  You were cared for by a hospitalist during your hospital stay. If you have any questions about your discharge medications or the care you received while you were in the hospital after you are discharged, you can call the unit and asked to speak with the hospitalist on call if the hospitalist that took care of you is not available. Once you are discharged, your primary care physician will handle any further medical issues. Please note that NO REFILLS for any discharge medications will be authorized once you are discharged, as it is imperative that you return to your primary care physician (or establish a relationship with a primary care physician if you do not have one) for your aftercare needs so that they can reassess your need for medications and monitor your lab values.

## 2015-11-10 NOTE — Progress Notes (Signed)
Pharmacy Antibiotic Note  Amy ShanksGlenda Gantt is a 75 y.o. female admitted on 10/30/2015 with nausea and poor oral intake. Continues on Unasyn D#10 for intra-abdominal infection. CT scan reveals stone vs mass in the CBD. Repeat ERCP 8/15 with partial clearance, sphincterotomy and lithotripsy. Remains afeb, WBC now wnl, SCr stable.  Plan: Continue Unasyn 3 g IV q6h Monitor renal function, c/s, clinical progress, LOT F/u repeat ERCP Tues  Height: 5\' 2"  (157.5 cm) Weight: 179 lb (81.2 kg) IBW/kg (Calculated) : 50.1  Temp (24hrs), Avg:98.5 F (36.9 C), Min:98.3 F (36.8 C), Max:98.8 F (37.1 C)   Recent Labs Lab 11/04/15 0439  11/05/15 0808 11/06/15 0551 11/07/15 0624 11/08/15 0602 11/09/15 0805 11/10/15 0521  WBC 9.8  --  8.9 6.9 6.9  --  9.4  --   CREATININE  --   < > 0.53 0.58 0.53 0.59 0.66 0.65  < > = values in this interval not displayed.  Estimated Creatinine Clearance: 61.8 mL/min (by C-G formula based on SCr of 0.8 mg/dL).    Allergies  Allergen Reactions  . Tetanus Toxoids Swelling    WELTS    Antimicrobials this admission: Unasyn 8/7 >>   Microbiology results: /7 BCx: 1/2 gm variable coccobacilli - Moraxella 8/7 BCID -negative 8/10 bile cx: Klebsiella (R-Amp, Sens to all others tested); Ecoli sent for further sens. testing  Thank you for allowing pharmacy to be a part of this patient's care.  Sherle Poeob Vincent, PharmD Clinical Pharmacist 1:21 PM, 11/10/2015

## 2015-11-11 NOTE — Progress Notes (Signed)
11-11-15 1045 Spoke with pt from home, plan to [facility] not have [facility]. Pt states she will notify Dr. Ewing SchleinMagod, if she changes her mind.

## 2015-11-13 LAB — SUSCEPTIBILITY RESULT

## 2015-11-13 LAB — SUSCEPTIBILITY, AER + ANAEROB

## 2015-11-14 ENCOUNTER — Other Ambulatory Visit: Payer: Self-pay | Admitting: *Deleted

## 2015-11-14 ENCOUNTER — Other Ambulatory Visit (HOSPITAL_COMMUNITY): Payer: Self-pay | Admitting: Radiology

## 2015-11-14 DIAGNOSIS — K831 Obstruction of bile duct: Secondary | ICD-10-CM

## 2015-11-14 DIAGNOSIS — K8050 Calculus of bile duct without cholangitis or cholecystitis without obstruction: Secondary | ICD-10-CM

## 2015-11-14 DIAGNOSIS — K8011 Calculus of gallbladder with chronic cholecystitis with obstruction: Secondary | ICD-10-CM

## 2015-11-14 LAB — CULTURE, BODY FLUID-BOTTLE

## 2015-11-14 LAB — CULTURE, BODY FLUID W GRAM STAIN -BOTTLE

## 2015-11-15 ENCOUNTER — Other Ambulatory Visit: Payer: Medicare Other

## 2015-11-15 ENCOUNTER — Encounter (HOSPITAL_COMMUNITY): Admission: RE | Payer: Self-pay | Source: Ambulatory Visit

## 2015-11-15 ENCOUNTER — Ambulatory Visit (HOSPITAL_COMMUNITY): Admission: RE | Admit: 2015-11-15 | Payer: Medicare Other | Source: Ambulatory Visit | Admitting: Gastroenterology

## 2015-11-15 SURGERY — ERCP, WITH INTERVENTION IF INDICATED
Anesthesia: General

## 2015-11-16 ENCOUNTER — Other Ambulatory Visit (HOSPITAL_COMMUNITY): Payer: Self-pay | Admitting: Interventional Radiology

## 2015-11-16 ENCOUNTER — Ambulatory Visit
Admission: RE | Admit: 2015-11-16 | Discharge: 2015-11-16 | Disposition: A | Discharge status: Home / Self Care | Payer: Medicare Other | Source: Ambulatory Visit | Attending: Radiology | Admitting: Radiology

## 2015-11-16 DIAGNOSIS — K8050 Calculus of bile duct without cholangitis or cholecystitis without obstruction: Secondary | ICD-10-CM

## 2015-11-16 DIAGNOSIS — K831 Obstruction of bile duct: Secondary | ICD-10-CM

## 2015-11-16 DIAGNOSIS — L0291 Cutaneous abscess, unspecified: Secondary | ICD-10-CM

## 2015-11-16 HISTORY — PX: IR GENERIC HISTORICAL: IMG1180011

## 2015-11-16 NOTE — Progress Notes (Signed)
Interventional Radiology Progress Note  Interval HPI:  75 yo female with recent admission to [facility] for pain and biliary obstruction 10/30/2015.   PTC with internal/external drain was performed 11/03/2015 following ERCP.  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    Int/ext drain was capped before discharge, and the patient presents today to assure patency.   She denies any pain.  Denies any fever/rigors/chills.  No concerns at this point.   Procedure: The drain was injected under fluoro, confirming that there is obstruction of the drain.  There is mild dilation of the biliary ductal system, and clear evidence of cholelithiasis.  There is only trance contrast traversing the ampulla.    Assessment & Plan:  The patient is currently awaiting appointment with GI appointment at [facility], though no scheduled date yet.  Confirmed this detail with GI office.   Today we put the bag to gravity drain, given the blockage of the drain.  We will arrange a biliary tube check/change at hospital to place a new drain.  Possible upsize needed.   Will discuss with Dr. Watt Climes, as he is currently in procedure.    Signed,  Dulcy Fanny. Earleen Newport, DO

## 2015-11-17 ENCOUNTER — Ambulatory Visit (HOSPITAL_COMMUNITY)
Admission: RE | Admit: 2015-11-17 | Discharge: 2015-11-17 | Disposition: A | Discharge status: Home / Self Care | Payer: Medicare Other | Source: Ambulatory Visit | Attending: Interventional Radiology | Admitting: Interventional Radiology

## 2015-11-17 ENCOUNTER — Other Ambulatory Visit: Payer: Self-pay | Admitting: Radiology

## 2015-11-17 ENCOUNTER — Encounter (HOSPITAL_COMMUNITY): Payer: Self-pay

## 2015-11-17 DIAGNOSIS — Z7982 Long term (current) use of aspirin: Secondary | ICD-10-CM | POA: Insufficient documentation

## 2015-11-17 DIAGNOSIS — K8061 Calculus of gallbladder and bile duct with cholecystitis, unspecified, with obstruction: Secondary | ICD-10-CM | POA: Diagnosis not present

## 2015-11-17 DIAGNOSIS — Z4682 Encounter for fitting and adjustment of non-vascular catheter: Secondary | ICD-10-CM | POA: Insufficient documentation

## 2015-11-17 DIAGNOSIS — L0291 Cutaneous abscess, unspecified: Secondary | ICD-10-CM

## 2015-11-17 HISTORY — PX: IR GENERIC HISTORICAL: IMG1180011

## 2015-11-17 MED ORDER — FENTANYL CITRATE (PF) 100 MCG/2ML IJ SOLN
INTRAMUSCULAR | Status: AC | PRN
  Administered 2015-11-17: 50 ug via INTRAVENOUS

## 2015-11-17 MED ORDER — SODIUM CHLORIDE 0.9 % IV SOLN
INTRAVENOUS | Status: AC | PRN
  Administered 2015-11-17: 10 mL/h via INTRAVENOUS

## 2015-11-17 MED ORDER — LIDOCAINE HCL 1 % IJ SOLN
INTRAMUSCULAR | Status: AC
  Administered 2015-11-17: 20 mL
  Filled 2015-11-17: qty 20

## 2015-11-17 MED ORDER — MIDAZOLAM HCL 2 MG/2ML IJ SOLN
INTRAMUSCULAR | Status: AC | PRN
  Administered 2015-11-17: 1 mg via INTRAVENOUS

## 2015-11-17 MED ORDER — MIDAZOLAM HCL 2 MG/2ML IJ SOLN
INTRAMUSCULAR | Status: AC
  Filled 2015-11-17: qty 2

## 2015-11-17 MED ORDER — FENTANYL CITRATE (PF) 100 MCG/2ML IJ SOLN
INTRAMUSCULAR | Status: AC
  Filled 2015-11-17: qty 2

## 2015-11-17 MED ORDER — IOPAMIDOL (ISOVUE-300) INJECTION 61%
INTRAVENOUS | Status: AC
  Administered 2015-11-17: 10 mL
  Filled 2015-11-17: qty 50

## 2015-11-17 NOTE — H&P (Signed)
Referring Physician(s): Dr. Vida RiggerMarc Magod  Supervising Physician: Ruel FavorsShick, Trevor  Patient Status: outpatient  Chief Complaint: Obstructive jaundice  Subjective: Amy Sheppard is well known to our service for her inpatient stay and she was seen by Dr. Loreta AveWagner yesterday in the clinic for a drain injection.  She had an I/E biliary drain placed on 11/03/15.  She underwent an ERCP with some stone extractions on 8/15; however, not all of her stones were removed and a stent was unable to be placed.  At this time, her drain was capped off to see how she did.  She returned to our clinic yesterday for a drain injection.  She had dilated ducts and minimal contrast went through.  She denies any pain.  She has been arranged today for a drain injection.  She was returned to a gravity bag which has put out, likely close to a 1000cc since yesterday.  Allergies: Tetanus toxoids  Medications: Prior to Admission medications   Medication Sig Start Date End Date Taking? Authorizing Provider  aspirin EC 81 MG tablet Take 81 mg by mouth at bedtime.   Yes Historical Provider, MD  atenolol (TENORMIN) 25 MG tablet Take 25 mg by mouth daily.   Yes Historical Provider, MD  gabapentin (NEURONTIN) 300 MG capsule Take 300 mg by mouth 2 (two) times daily. 06/09/15 06/08/16 Yes Historical Provider, MD  omeprazole (PRILOSEC) 40 MG capsule Take 40 mg by mouth daily.   Yes Historical Provider, MD  potassium chloride (K-DUR,KLOR-CON) 10 MEQ tablet Take 10 mEq by mouth daily.   Yes Historical Provider, MD  simvastatin (ZOCOR) 20 MG tablet Take 20 mg by mouth daily at 6 PM.   Yes Historical Provider, MD  Vitamins/Minerals TABS Take 1 tablet by mouth daily.   Yes Historical Provider, MD  amoxicillin-clavulanate (AUGMENTIN) 875-125 MG tablet Take 1 tablet by mouth 2 (two) times daily. Patient not taking: Reported on 11/17/2015 11/10/15   Starleen Armsawood S Elgergawy, MD    Vital Signs: BP 114/73 (BP Location: Left Arm)   Pulse 83   Resp  18   SpO2 98%   Physical Exam: Gen: Pleasant, WD, WN, female in NAD Heart: regular Lungs: CTAB Abd: soft, NT, ND, +BS, drain site is c/d/i with bilious output in her bag.  Imaging: Dg Sinus/fist Tube Chk-non Gi  Result Date: 11/16/2015 INDICATION: 75 year old female with a history of recent admission to the hospital for episode of biliary dyskinesia/ choledocholithiasis. Internal external drain placed 11/03/2015. ERCP difficult given ampulla within duodenum diverticulum. She apparently has retained stones with blockage. She is [facility]. She returns today for a tube check. EXAM: ABSCESS INJECTION MEDICATIONS: The patient is currently admitted to the hospital and receiving intravenous antibiotics. The antibiotics were administered within an appropriate time frame prior to the initiation of the procedure. ANESTHESIA/SEDATION: None COMPLICATIONS: None PROCEDURE: Informed written consent was obtained from the patient after a thorough discussion of the procedural risks, benefits and alternatives. All questions were addressed. Patient positioned supine position on the fluoroscopy table. Scout image was acquired of the upper abdomen demonstrating tube within position. Contrast was injected through the indwelling internal external biliary drain. Final image was stored. The internal external drain was attached to gravity drainage. Findings discussed with the patient. FINDINGS: Initial image demonstrates the internal external drain within the upper abdomen. The limited through the tube cholangiogram demonstrates dilation of the common hepatic duct and the common bile duct. Dilation of the cystic duct also evident. Filling  defect at the confluence of the cystic duct and common hepatic duct, compatible with debris/stone. Multiple filling defects within the gallbladder lumen compatible with cholelithiasis. There is very little contrast traversing the ampulla into the duodenum,  though the final image confirms small contrast traversing the ampulla. No significant contrast traverses the internal external biliary drain. IMPRESSION: Through the tube limited cholangiogram via internal/external biliary drain confirms choledocholithiasis, cholelithiasis, and obstruction of the drain. Also, there is very little contrast traversing the ampulla adjacent to the drain with mild dilation of the extrahepatic biliary system, suggesting near complete blockage of the biliary system. Drain will be placed to gravity drainage, and the patient will be scheduled for internal/external biliary drain exchange. Signed, Yvone Neu. Loreta Ave, DO [facility] Electronically Signed   By: Gilmer Mor D.O.   On: 11/16/2015 14:40    Labs:  CBC:  Recent Labs  11/05/15 0808 11/06/15 0551 11/07/15 0624 11/09/15 0805  WBC 8.9 6.9 6.9 9.4  HGB 8.6* 9.0* 9.7* 9.9*  HCT 26.1* 27.0* 30.2* 31.7*  PLT 259 307 346 373    COAGS:  Recent Labs  11/03/15 0751  INR 1.10  APTT 26    BMP:  Recent Labs  11/07/15 0624 11/08/15 0602 11/09/15 0805 11/10/15 0521  NA 134* 133* 133* 133*  K 3.7 3.9 3.9 4.0  CL 100* 99* 99* 100*  CO2 26 27 25 25   GLUCOSE 93 101* 99 96  BUN <5* <5* 5* <5*  CALCIUM 8.4* 8.6* 8.5* 8.4*  CREATININE 0.53 0.59 0.66 0.65  GFRNONAA >60 >60 >60 >60  GFRAA >60 >60 >60 >60    LIVER FUNCTION TESTS:  Recent Labs  11/07/15 0624 11/08/15 0602 11/09/15 0805 11/10/15 0521  BILITOT 2.2* 2.1* 1.9* 1.8*  AST 22 20 17  14*  ALT 45 37 31 28  ALKPHOS 139* 130* 124 112  PROT 5.5* 5.7* 5.6* 5.8*  ALBUMIN 2.2* 2.3* 2.3* 2.3*    Assessment and Plan: 1. Obstruction jaundice secondary to choledocholithiasis, s/p I/E drain placement -we will plan to proceed with drain placement.  She is currently awaiting referral to [facility] for further care of her blocked duct.   -the procedure including risks and complications were  discussed with the patient.  She understands and has signed her consent form to proceed.  Electronically Signed: Letha Cape 11/17/2015, 8:51 AM   I spent a total of 25 Minutes at the the patient's bedside AND on the patient's hospital floor or unit, greater than 50% of which was counseling/coordinating care for obstructive jaundice secondary to choledocholithiasis

## 2015-11-17 NOTE — Care Management Note (Signed)
Case Management Note  Patient Details  Name: Charlesetta ShanksGlenda Vickroy MRN: [id number] Date of Birth: 09-Dec-1940  Subjective/Objective:                  75 yo female arranged in IR today for a drain injection.  /Home alone.   Action/Plan: She was returned to a gravity bag which has put out, likely close to a 1000cc since yesterday. Rinaldo Ratel/Follow for disposition needs; set up home health services.   Expected Discharge Date:         11/17/15         Expected Discharge Plan:  Home w Home Health Services  In-House Referral:  NA  Discharge planning Services  CM Consult  Post Acute Care Choice:  NA Choice offered to:  Patient  DME Arranged:  N/A DME Agency:  NA  HH Arranged:  RN HH Agency:  [facility]  Status of Service:  Completed, signed off  If discussed at Long Length of Stay Meetings, dates discussed:    Additional Comments: Pt was active with [facility] for RN services.  Resumption of care requested. Avie EchevariaKaren Nussbaum, RN of [facility] notified.  No DME needs identified at this time.  Oletta CohnWood, Maybree Riling, RN 11/17/2015, 11:01 AM

## 2015-11-17 NOTE — Procedures (Signed)
S/p LT INT / EXT BILIARY DRAIN EXCHG  No comp Stable Full report in PACS

## 2015-11-17 NOTE — Sedation Documentation (Signed)
Pt awake and alert. In no distress. Pt set up with home health. Home health will call pt with info on nurse visit. Pt and sister agree with plan. Biliary drain bag to right leg with leg strap. Site dry and intact, draining bile. Pt awake and alert. In no distress. D/C'd home in wheelchair with sister. Verbalized understanding discharge instructions.

## 2015-11-22 ENCOUNTER — Other Ambulatory Visit (HOSPITAL_COMMUNITY): Payer: Self-pay | Admitting: Radiology

## 2015-11-22 DIAGNOSIS — K831 Obstruction of bile duct: Secondary | ICD-10-CM

## 2015-11-22 DIAGNOSIS — K8050 Calculus of bile duct without cholangitis or cholecystitis without obstruction: Secondary | ICD-10-CM

## 2015-12-22 ENCOUNTER — Other Ambulatory Visit: Payer: Medicare Other

## 2016-03-29 ENCOUNTER — Encounter: Payer: Self-pay | Admitting: Interventional Radiology

## 2016-05-30 ENCOUNTER — Emergency Department (HOSPITAL_BASED_OUTPATIENT_CLINIC_OR_DEPARTMENT_OTHER): Payer: Medicare Other

## 2016-05-30 ENCOUNTER — Encounter (HOSPITAL_BASED_OUTPATIENT_CLINIC_OR_DEPARTMENT_OTHER): Payer: Self-pay | Admitting: *Deleted

## 2016-05-30 ENCOUNTER — Emergency Department (HOSPITAL_BASED_OUTPATIENT_CLINIC_OR_DEPARTMENT_OTHER)
Admission: EM | Admit: 2016-05-30 | Discharge: 2016-05-30 | Disposition: A | Discharge status: Home / Self Care | Payer: Medicare Other | Attending: Emergency Medicine | Admitting: Emergency Medicine

## 2016-05-30 DIAGNOSIS — Z87891 Personal history of nicotine dependence: Secondary | ICD-10-CM | POA: Diagnosis not present

## 2016-05-30 DIAGNOSIS — Y9389 Activity, other specified: Secondary | ICD-10-CM | POA: Diagnosis not present

## 2016-05-30 DIAGNOSIS — S0181XA Laceration without foreign body of other part of head, initial encounter: Secondary | ICD-10-CM | POA: Insufficient documentation

## 2016-05-30 DIAGNOSIS — S0990XA Unspecified injury of head, initial encounter: Secondary | ICD-10-CM | POA: Diagnosis present

## 2016-05-30 DIAGNOSIS — Y929 Unspecified place or not applicable: Secondary | ICD-10-CM | POA: Diagnosis not present

## 2016-05-30 DIAGNOSIS — W01190A Fall on same level from slipping, tripping and stumbling with subsequent striking against furniture, initial encounter: Secondary | ICD-10-CM | POA: Diagnosis not present

## 2016-05-30 DIAGNOSIS — Z7982 Long term (current) use of aspirin: Secondary | ICD-10-CM | POA: Diagnosis not present

## 2016-05-30 DIAGNOSIS — I10 Essential (primary) hypertension: Secondary | ICD-10-CM | POA: Diagnosis not present

## 2016-05-30 DIAGNOSIS — Y999 Unspecified external cause status: Secondary | ICD-10-CM | POA: Diagnosis not present

## 2016-05-30 LAB — CBC WITH DIFFERENTIAL/PLATELET
BASOS PCT: 1 %
Basophils Absolute: 0.1 10*3/uL (ref 0.0–0.1)
Eosinophils Absolute: 0.1 10*3/uL (ref 0.0–0.7)
Eosinophils Relative: 3 %
HCT: 34.2 % — ABNORMAL LOW (ref 36.0–46.0)
HEMOGLOBIN: 11 g/dL — AB (ref 12.0–15.0)
LYMPHS ABS: 1.2 10*3/uL (ref 0.7–4.0)
Lymphocytes Relative: 29 %
MCH: 28.9 pg (ref 26.0–34.0)
MCHC: 32.2 g/dL (ref 30.0–36.0)
MCV: 90 fL (ref 78.0–100.0)
MONO ABS: 0.5 10*3/uL (ref 0.1–1.0)
MONOS PCT: 13 %
NEUTROS ABS: 2.2 10*3/uL (ref 1.7–7.7)
Neutrophils Relative %: 54 %
Platelets: 239 10*3/uL (ref 150–400)
RBC: 3.8 MIL/uL — ABNORMAL LOW (ref 3.87–5.11)
RDW: 13.9 % (ref 11.5–15.5)
WBC: 4 10*3/uL (ref 4.0–10.5)

## 2016-05-30 LAB — BASIC METABOLIC PANEL
Anion gap: 3 — ABNORMAL LOW (ref 5–15)
BUN: 8 mg/dL (ref 6–20)
CALCIUM: 8.9 mg/dL (ref 8.9–10.3)
CHLORIDE: 105 mmol/L (ref 101–111)
CO2: 28 mmol/L (ref 22–32)
Creatinine, Ser: 0.68 mg/dL (ref 0.44–1.00)
GFR calc Af Amer: >60 mL/min (ref >60)
GFR calc non Af Amer: >60 mL/min (ref >60)
GLUCOSE: 120 mg/dL — AB (ref 65–99)
Potassium: 3.8 mmol/L (ref 3.5–5.1)
Sodium: 136 mmol/L (ref 135–145)

## 2016-05-30 MED ORDER — LIDOCAINE-EPINEPHRINE 1 %-1:100000 IJ SOLN
INTRAMUSCULAR | Status: AC
  Administered 2016-05-30: 08:00:00
  Filled 2016-05-30: qty 1

## 2016-05-30 MED ORDER — LIDOCAINE-EPINEPHRINE 2 %-1:100000 IJ SOLN: 20.0000 mL | Freq: Once | INTRAMUSCULAR | Status: DC

## 2016-05-30 NOTE — ED Triage Notes (Signed)
Pt reports trip and fall while getting out of bed this morning, states her feet were tangled in her sheets and she hit her head on the nightstand. Dressing removed to reveal laceration, unable to assess due to amt of blood, active bleeding, and wound is spraying and pulsating, dressing and direct pressure applied, dr. Judd Liendelo alerted to bedside, supplies gathered. Pt is a/a/ox4, rates her pain at 1/10, denies loc or any other injury

## 2016-05-30 NOTE — ED Notes (Signed)
ED Provider at bedside. 

## 2016-05-30 NOTE — ED Notes (Signed)
Heart rate on monitor noted to be in 30's, manual pulse check is 70's and irregular. Pt denies any hx of afib or any heart problems. Dr. Judd Liendelo informed, ekg ordered. Pt denies any sob, nausea, dizzyness or other c/o.

## 2016-05-30 NOTE — Discharge Instructions (Signed)
Local wound care with bacitracin and dressing changes twice daily.  Sutures are to be removed in 5-7 days. Please follow-up with your primary Dr. for this.  Return to the emergency department if you develop redness around the wound, pus draining from the wound, increased swelling or pain, or other new and concerning symptoms.

## 2016-05-30 NOTE — ED Provider Notes (Addendum)
[facility] Provider Note   CSN: [id number] Arrival date & time: 05/30/16  [id number]     History   Chief Complaint Chief Complaint  Patient presents with  . Head Injury    HPI Amy ShanksGlenda Amy Sheppard is a 76 y.o. female.  Patient is a 76 year old female with past medical history of hypertension, high cholesterol. She presents today for evaluation of a head injury. She states she was getting out of bed this morning when she tripped and hit her head on the nightstand. She has a laceration to the right temple with arterial bleeding. She denies any loss of consciousness. She does feel somewhat dizzy. She denies any neck pain she denies any visual disturbances.   The history is provided by the patient.  Head Injury   The incident occurred less than 1 hour ago. She came to the ER via walk-in. The injury mechanism was a fall. There was no loss of consciousness. The volume of blood lost was large. The quality of the pain is described as dull. The pain is moderate. The pain has been constant since the injury. Pertinent negatives include no blurred vision and no vomiting.    Past Medical History:  Diagnosis Date  . GERD (gastroesophageal reflux disease)   . High cholesterol   . Hypertension   . Lymphocytic colitis   . Migraine    "never anymore; it's been awhile since I've had one" (11/01/2015)  . UTI (lower urinary tract infection) X 1    Patient Active Problem List   Diagnosis Date Noted  . SOB (shortness of breath)   . Dehydration   . Essential hypertension   . Obstructive jaundice 11/01/2015  . Transaminitis 11/01/2015  . Hypophosphatemia 11/01/2015  . Hypokalemia 11/01/2015  . Wheezing 11/01/2015  . Calculus of bile duct with obstruction and without cholangitis or cholecystitis 10/30/2015  . Common bile duct dilatation 10/30/2015    Past Surgical History:  Procedure Laterality Date  . ERCP N/A 11/08/2015   Procedure: ENDOSCOPIC RETROGRADE CHOLANGIOPANCREATOGRAPHY (ERCP);   Surgeon: Vida RiggerMarc Magod, MD;  [facility];  Service: Endoscopy;  Laterality: N/A;  . ESOPHAGOGASTRODUODENOSCOPY N/A 11/02/2015   Procedure: ESOPHAGOGASTRODUODENOSCOPY (EGD);  Surgeon: Vida RiggerMarc Magod, MD;  [facility];  Service: Endoscopy;  Laterality: N/A;  . IR GENERIC HISTORICAL  11/03/2015   IR INT EXT BILIARY DRAIN WITH CHOLANGIOGRAM 11/03/2015 Richarda OverlieAdam Henn, MD [facility]  . IR GENERIC HISTORICAL  11/17/2015   IR EXCHANGE BILIARY DRAIN 11/17/2015 Berdine DanceMichael Shick, MD [facility]  . IR GENERIC HISTORICAL  11/16/2015   IR RADIOLOGIST EVAL & MGMT 11/16/2015 Gilmer MorJaime Wagner, DO [facility] INTER[facility] RAD  . SPYGLASS CHOLANGIOSCOPY N/A 11/08/2015   Procedure: SPYGLASS CHOLANGIOSCOPY;  Surgeon: Vida RiggerMarc Magod, MD;  [facility];  Service: Endoscopy;  Laterality: N/A;  . TUBAL LIGATION  1970s    OB History    No data available       Home Medications    Prior to Admission medications   Medication Sig Start Date End Date Taking? Authorizing Provider  aspirin EC 81 MG tablet Take 81 mg by mouth at bedtime.    Historical Provider, MD  atenolol (TENORMIN) 25 MG tablet Take 25 mg by mouth daily.    Historical Provider, MD  gabapentin (NEURONTIN) 300 MG capsule Take 300 mg by mouth 2 (two) times daily. 06/09/15 06/08/16  Historical Provider, MD  omeprazole (PRILOSEC) 40 MG capsule Take 40 mg by mouth daily.    Historical Provider, MD  potassium chloride (K-DUR,KLOR-CON) 10 MEQ  tablet Take 10 mEq by mouth daily.    Historical Provider, MD  simvastatin (ZOCOR) 20 MG tablet Take 20 mg by mouth daily at 6 PM.    Historical Provider, MD  Vitamins/Minerals TABS Take 1 tablet by mouth daily.    Historical Provider, MD    Family History History reviewed. No pertinent family history.  Social History Social History  Substance Use Topics  . Smoking status: Former Smoker    Packs/day: 1.00    Years: 30.00    Types: Cigarettes  . Smokeless tobacco: Never Used  . Alcohol use No     Allergies     Tetanus toxoids   Review of Systems Review of Systems  Eyes: Negative for blurred vision.  Gastrointestinal: Negative for vomiting.  All other systems reviewed and are negative.    Physical Exam Updated Vital Signs There were no vitals taken for this visit.  Physical Exam  Constitutional: She is oriented to person, place, and time. She appears well-developed and well-nourished. No distress.  HENT:  Head: Normocephalic.  There is a 2.5 cm laceration to the right temple with arterial bleeding.  Neck: Normal range of motion. Neck supple.  Cardiovascular: Normal rate and regular rhythm.  Exam reveals no gallop and no friction rub.   No murmur Amy Sheppard. Pulmonary/Chest: Effort normal and breath sounds normal. No respiratory distress. She has no wheezes.  Abdominal: Soft. Bowel sounds are normal. She exhibits no distension. There is no tenderness.  Musculoskeletal: Normal range of motion.  Neurological: She is alert and oriented to person, place, and time.  Skin: Skin is warm and dry. She is not diaphoretic.  Nursing note and vitals reviewed.    ED Treatments / Results  Labs (all labs ordered are listed, but only abnormal results are displayed) Labs Reviewed  BASIC METABOLIC PANEL  CBC WITH DIFFERENTIAL/PLATELET    EKG  EKG Interpretation  Date/Time:  Wednesday May 30 2016 09:29:01 EST Ventricular Rate:  68 PR Interval:    QRS Duration: 87 QT Interval:  420 QTC Calculation: 447 R Axis:   76 Text Interpretation:  Sinus rhythm Baseline wander in lead(s) I Confirmed by Kallum Jorgensen  MD, Annikah Lovins ([id number]) on 05/30/2016 9:32:34 AM       Radiology No results found.  Procedures Procedures (including critical care time)  Medications Ordered in ED Medications  lidocaine-EPINEPHrine (XYLOCAINE W/EPI) 2 %-1:100000 (with pres) injection 20 mL (not administered)  lidocaine-EPINEPHrine (XYLOCAINE W/EPI) 1 %-1:100000 (with pres) injection (not administered)     Initial Impression  / Assessment and Plan / ED Course  I have reviewed the triage vital signs and the nursing notes.  Pertinent labs & imaging results that were available during my care of the patient were reviewed by me and considered in my medical decision making (see chart for details).  Patient presents after a fall In which she struck her head and caused a laceration to her right temple with arterial bleeding. I was able suture the wound and stop the bleeding. She tolerated this well.  She is neurologically intact and head CT is negative. Her hemoglobin is 11 and she appears hemodynamically stable. She is to return as needed for any problems. Sutures are to be removed in 5-7 days.   LACERATION REPAIR Performed by: Geoffery Lyons Authorized by: Geoffery Lyons Consent: Verbal consent obtained. Risks and benefits: risks, benefits and alternatives were discussed Consent given by: patient Patient identity confirmed: provided demographic data Prepped and Draped in normal sterile fashion Wound explored  Laceration  Location: right temple  Laceration Length: 2.5cm  No Foreign Bodies seen or palpated  Anesthesia: local infiltration  Local anesthetic: lidocaine 1% with epinephrine  Anesthetic total: 2 ml  Irrigation method: syringe Amount of cleaning: standard  Skin closure: 4-0 ethilon  Number of sutures: 6  Technique: simple interrupted  Patient tolerance: Patient tolerated the procedure well with no immediate complications.   Addendum: As the patient was being discharged, the nurse felt as though she was observing an irregular heartbeat. An EKG was obtained and reveals a sinus rhythm at a rate of 68. We will continue with discharge as planned.   Final Clinical Impressions(s) / ED Diagnoses   Final diagnoses:  None    New Prescriptions New Prescriptions   No medications on file     Geoffery Lyons, MD 05/30/16 1610    Geoffery Lyons, MD 05/30/16 [phone]

## 2018-03-12 IMAGING — US IR INT-EXT BILIARY DRAIN W/ CHOLANGIOGRAM
1 series · 1 of 1 positions shown · non-contrast
Comparison: none

INDICATION: 74-year-old with obstructive jaundice. CT imaging demonstrates an
obstruction in the common bile duct. Unclear if this is related to a
stone or tumor. Unfortunately, biliary system could not be
decompressed or further evaluated from an endoscopic approach.
Patient needs biliary decompression and diagnostic imaging of the
biliary system.

[Series 1: ir (id) (id)/(id) · 1 of 1 slices shown]
[im 1/1]
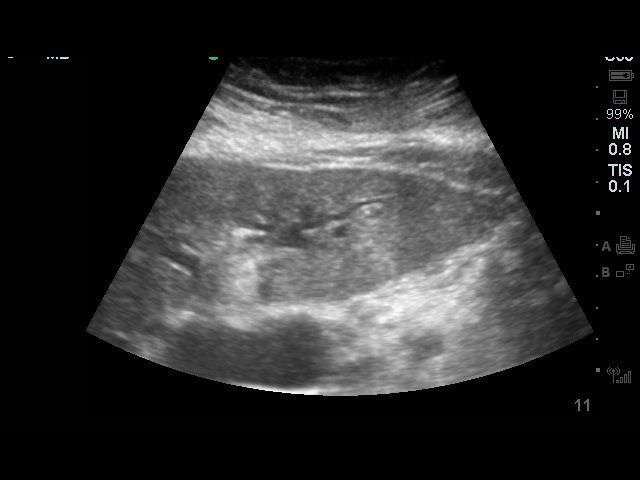

[1 of 1 positions shown; findings below may reference images not displayed]

EXAM:
PERCUTANEOUS TRANSHEPATIC CHOLANGIOGRAM WITH ULTRASOUND AND
FLUOROSCOPIC GUIDANCE

PLACEMENT OF INTERNAL/ EXTERNAL BILIARY DRAIN

MEDICATIONS:
Unasyn 3 g; The antibiotic was administered within an appropriate
time frame prior to the initiation of the procedure.

ANESTHESIA/SEDATION:
Moderate (conscious) sedation was employed during this procedure. A
total of Versed 2.0 mg and Fentanyl 75 mcg was administered
intravenously.

Moderate Sedation Time: 35 minutes. The patient's level of
consciousness and vital signs were monitored continuously by
radiology nursing throughout the procedure under my direct
supervision.

FLUOROSCOPY TIME:  Fluoroscopy Time: 8 minutes, 48 mGy

COMPLICATIONS:
None immediate.

PROCEDURE:
Informed written consent was obtained from the patient after a
thorough discussion of the procedural risks, benefits and
alternatives. All questions were addressed. Maximal Sterile Barrier
Technique was utilized including caps, mask, sterile gowns, sterile
gloves, sterile drape, hand hygiene and skin antiseptic. A timeout
was performed prior to the initiation of the procedure.

Patient was placed supine on the interventional table. The anterior
abdomen was prepped and draped in a sterile fashion. Dilated
intrahepatic ducts identified with ultrasound. Skin was anesthetized
with 1% lidocaine. Using ultrasound guidance, a 22 gauge needle was
directed into a dilated intrahepatic duct in the left hepatic lobe.
Contrast injection confirmed placement in the biliary system. Wire
would not advance from this access due to excessive patient
breathing. Needle was redirected slightly more central at a
different angle. Contrast was again injected and confirmed placement
in the biliary system. A wire was successfully advanced into the
central biliary system. Tract was dilated with an Accustick dilator
[DATE] French Kumpe the catheter was advanced beyond the common
bile duct obstruction using a Glidewire. Glidewire and catheter were
advanced into the duodenum. Tract was dilated to accommodate a 10
French biliary drain. The distal aspect was advanced into the
duodenum. Biliary sample was sent for cytology and culture. Small
amount of contrast was injected to confirm placement. Catheter was
sutured to skin. Dressing placed over the catheter.
FINDINGS: Severe intrahepatic and extrahepatic biliary dilatation. Access
obtained from a dilated branch in the lateral left hepatic lobe. At
least 3 large intraluminal filling defects compatible with stones.
There are additional smaller stones in the common bile duct and
dilated cystic duct. Drainage catheter was successfully advanced
into the duodenum.
IMPRESSION: Successful percutaneous transhepatic cholangiogram and placement of
internal/external biliary drain. Drain was placed from a left
hepatic approach.

Multiple large filling defects in the extrahepatic biliary system
are compatible with stones.

PLAN:
Follow drain output and lab values. Will need to coordinate stone
management with GI.

## 2021-11-28 ENCOUNTER — Observation Stay (HOSPITAL_COMMUNITY): Payer: Medicare Other

## 2021-11-28 ENCOUNTER — Encounter (HOSPITAL_COMMUNITY): Payer: Self-pay | Admitting: Emergency Medicine

## 2021-11-28 ENCOUNTER — Inpatient Hospital Stay (HOSPITAL_COMMUNITY)
Admission: EM | Admit: 2021-11-28 | Discharge: 2021-11-30 | DRG: 123 | Disposition: A | Discharge status: Home / Self Care | Payer: Medicare Other | Attending: Internal Medicine | Admitting: Internal Medicine

## 2021-11-28 ENCOUNTER — Emergency Department (HOSPITAL_COMMUNITY): Payer: Medicare Other

## 2021-11-28 DIAGNOSIS — Z923 Personal history of irradiation: Secondary | ICD-10-CM

## 2021-11-28 DIAGNOSIS — Z20822 Contact with and (suspected) exposure to covid-19: Secondary | ICD-10-CM | POA: Diagnosis present

## 2021-11-28 DIAGNOSIS — I10 Essential (primary) hypertension: Secondary | ICD-10-CM | POA: Diagnosis present

## 2021-11-28 DIAGNOSIS — I700 Atherosclerosis of aorta: Secondary | ICD-10-CM | POA: Diagnosis present

## 2021-11-28 DIAGNOSIS — Z803 Family history of malignant neoplasm of breast: Secondary | ICD-10-CM

## 2021-11-28 DIAGNOSIS — Z79811 Long term (current) use of aromatase inhibitors: Secondary | ICD-10-CM

## 2021-11-28 DIAGNOSIS — K219 Gastro-esophageal reflux disease without esophagitis: Secondary | ICD-10-CM | POA: Diagnosis present

## 2021-11-28 DIAGNOSIS — I6523 Occlusion and stenosis of bilateral carotid arteries: Secondary | ICD-10-CM | POA: Diagnosis present

## 2021-11-28 DIAGNOSIS — Z853 Personal history of malignant neoplasm of breast: Secondary | ICD-10-CM

## 2021-11-28 DIAGNOSIS — H5462 Unqualified visual loss, left eye, normal vision right eye: Secondary | ICD-10-CM | POA: Diagnosis not present

## 2021-11-28 DIAGNOSIS — E669 Obesity, unspecified: Secondary | ICD-10-CM | POA: Diagnosis present

## 2021-11-28 DIAGNOSIS — Z79899 Other long term (current) drug therapy: Secondary | ICD-10-CM

## 2021-11-28 DIAGNOSIS — K529 Noninfective gastroenteritis and colitis, unspecified: Secondary | ICD-10-CM | POA: Diagnosis present

## 2021-11-28 DIAGNOSIS — Z87891 Personal history of nicotine dependence: Secondary | ICD-10-CM

## 2021-11-28 DIAGNOSIS — Z888 Allergy status to other drugs, medicaments and biological substances status: Secondary | ICD-10-CM

## 2021-11-28 DIAGNOSIS — I2510 Atherosclerotic heart disease of native coronary artery without angina pectoris: Secondary | ICD-10-CM | POA: Diagnosis present

## 2021-11-28 DIAGNOSIS — Z6835 Body mass index (BMI) 35.0-35.9, adult: Secondary | ICD-10-CM

## 2021-11-28 DIAGNOSIS — Z23 Encounter for immunization: Secondary | ICD-10-CM

## 2021-11-28 DIAGNOSIS — R7982 Elevated C-reactive protein (CRP): Secondary | ICD-10-CM | POA: Diagnosis present

## 2021-11-28 DIAGNOSIS — H3412 Central retinal artery occlusion, left eye: Principal | ICD-10-CM | POA: Diagnosis present

## 2021-11-28 DIAGNOSIS — H3410 Central retinal artery occlusion, unspecified eye: Secondary | ICD-10-CM | POA: Diagnosis present

## 2021-11-28 DIAGNOSIS — Z7982 Long term (current) use of aspirin: Secondary | ICD-10-CM

## 2021-11-28 DIAGNOSIS — G43909 Migraine, unspecified, not intractable, without status migrainosus: Secondary | ICD-10-CM | POA: Diagnosis present

## 2021-11-28 DIAGNOSIS — E7800 Pure hypercholesterolemia, unspecified: Secondary | ICD-10-CM | POA: Diagnosis present

## 2021-11-28 LAB — CBC
HCT: 37 % (ref 36.0–46.0)
Hemoglobin: 11.9 g/dL — ABNORMAL LOW (ref 12.0–15.0)
MCH: 30.3 pg (ref 26.0–34.0)
MCHC: 32.2 g/dL (ref 30.0–36.0)
MCV: 94.1 fL (ref 80.0–100.0)
Platelets: 265 10*3/uL (ref 150–400)
RBC: 3.93 MIL/uL (ref 3.87–5.11)
RDW: 13.1 % (ref 11.5–15.5)
WBC: 5.1 10*3/uL (ref 4.0–10.5)
nRBC: 0 % (ref 0.0–0.2)

## 2021-11-28 LAB — URINALYSIS, ROUTINE W REFLEX MICROSCOPIC
Bacteria, UA: NONE SEEN
Bilirubin Urine: NEGATIVE
Glucose, UA: NEGATIVE mg/dL
Ketones, ur: NEGATIVE mg/dL
Nitrite: NEGATIVE
Protein, ur: NEGATIVE mg/dL
Specific Gravity, Urine: 1.006 (ref 1.005–1.030)
pH: 6 (ref 5.0–8.0)

## 2021-11-28 LAB — RESP PANEL BY RT-PCR (FLU A&B, COVID) ARPGX2
Influenza A by PCR: NEGATIVE
Influenza B by PCR: NEGATIVE
SARS Coronavirus 2 by RT PCR: NEGATIVE

## 2021-11-28 LAB — RAPID URINE DRUG SCREEN, HOSP PERFORMED
Amphetamines: NOT DETECTED
Barbiturates: NOT DETECTED
Benzodiazepines: NOT DETECTED
Cocaine: NOT DETECTED
Opiates: NOT DETECTED
Tetrahydrocannabinol: NOT DETECTED

## 2021-11-28 LAB — LIPID PANEL
Cholesterol: 177 mg/dL (ref 0–200)
HDL: 45 mg/dL (ref >40)
LDL Cholesterol: 89 mg/dL (ref 0–99)
Total CHOL/HDL Ratio: 3.9 RATIO
Triglycerides: 213 mg/dL — ABNORMAL HIGH (ref <150)
VLDL: 43 mg/dL — ABNORMAL HIGH (ref 0–40)

## 2021-11-28 LAB — DIFFERENTIAL
Abs Immature Granulocytes: 0.02 10*3/uL (ref 0.00–0.07)
Basophils Absolute: 0.1 10*3/uL (ref 0.0–0.1)
Basophils Relative: 1 %
Eosinophils Absolute: 0.1 10*3/uL (ref 0.0–0.5)
Eosinophils Relative: 2 %
Immature Granulocytes: 0 %
Lymphocytes Relative: 18 %
Lymphs Abs: 0.9 10*3/uL (ref 0.7–4.0)
Monocytes Absolute: 0.5 10*3/uL (ref 0.1–1.0)
Monocytes Relative: 9 %
Neutro Abs: 3.6 10*3/uL (ref 1.7–7.7)
Neutrophils Relative %: 70 %

## 2021-11-28 LAB — COMPREHENSIVE METABOLIC PANEL
ALT: 14 U/L (ref 0–44)
AST: 17 U/L (ref 15–41)
Albumin: 3.7 g/dL (ref 3.5–5.0)
Alkaline Phosphatase: 46 U/L (ref 38–126)
Anion gap: 9 (ref 5–15)
BUN: 7 mg/dL — ABNORMAL LOW (ref 8–23)
CO2: 29 mmol/L (ref 22–32)
Calcium: 9.5 mg/dL (ref 8.9–10.3)
Chloride: 101 mmol/L (ref 98–111)
Creatinine, Ser: 0.77 mg/dL (ref 0.44–1.00)
GFR, Estimated: >60 mL/min (ref >60)
Glucose, Bld: 119 mg/dL — ABNORMAL HIGH (ref 70–99)
Potassium: 4 mmol/L (ref 3.5–5.1)
Sodium: 139 mmol/L (ref 135–145)
Total Bilirubin: 0.9 mg/dL (ref 0.3–1.2)
Total Protein: 6.7 g/dL (ref 6.5–8.1)

## 2021-11-28 LAB — HEMOGLOBIN A1C
Hgb A1c MFr Bld: 6 % — ABNORMAL HIGH (ref 4.8–5.6)
Mean Plasma Glucose: 125.5 mg/dL

## 2021-11-28 LAB — PROTIME-INR
INR: 1.1 (ref 0.8–1.2)
Prothrombin Time: 14.1 seconds (ref 11.4–15.2)

## 2021-11-28 LAB — C-REACTIVE PROTEIN: CRP: 1 mg/dL — ABNORMAL HIGH (ref <1.0)

## 2021-11-28 LAB — ETHANOL: Alcohol, Ethyl (B): <10 mg/dL (ref <10)

## 2021-11-28 LAB — SEDIMENTATION RATE: Sed Rate: 48 mm/hr — ABNORMAL HIGH (ref 0–22)

## 2021-11-28 LAB — APTT: aPTT: 29 seconds (ref 24–36)

## 2021-11-28 MED ORDER — PANTOPRAZOLE SODIUM 40 MG PO TBEC
40.0000 mg | DELAYED_RELEASE_TABLET | Freq: Every day | ORAL | Status: DC
  Administered 2021-11-28 – 2021-11-30 (×3): 40 mg via ORAL
  Filled 2021-11-28 (×3): qty 1

## 2021-11-28 MED ORDER — LETROZOLE 2.5 MG PO TABS
2.5000 mg | ORAL_TABLET | Freq: Every day | ORAL | Status: DC
Start: 2021-11-28 — End: 2021-11-30
  Administered 2021-11-29 – 2021-11-30 (×2): 2.5 mg via ORAL
  Filled 2021-11-28 (×3): qty 1

## 2021-11-28 MED ORDER — CLOPIDOGREL BISULFATE 300 MG PO TABS
300.0000 mg | ORAL_TABLET | Freq: Once | ORAL | Status: AC
Start: 2021-11-28 — End: 2021-11-28
  Administered 2021-11-28: 300 mg via ORAL
  Filled 2021-11-28: qty 1

## 2021-11-28 MED ORDER — CLOPIDOGREL BISULFATE 75 MG PO TABS
75.0000 mg | ORAL_TABLET | Freq: Every day | ORAL | Status: DC
  Administered 2021-11-29 – 2021-11-30 (×2): 75 mg via ORAL
  Filled 2021-11-28 (×2): qty 1

## 2021-11-28 MED ORDER — IOHEXOL 350 MG/ML SOLN
75.0000 mL | Freq: Once | INTRAVENOUS | Status: AC | PRN
Start: 2021-11-28 — End: 2021-11-28
  Administered 2021-11-28: 75 mL via INTRAVENOUS

## 2021-11-28 MED ORDER — GABAPENTIN 600 MG PO TABS
300.0000 mg | ORAL_TABLET | Freq: Two times a day (BID) | ORAL | Status: DC
  Administered 2021-11-28 – 2021-11-30 (×4): 300 mg via ORAL
  Filled 2021-11-28 (×4): qty 1

## 2021-11-28 MED ORDER — ASPIRIN 325 MG PO TABS
325.0000 mg | ORAL_TABLET | Freq: Once | ORAL | Status: AC
Start: 2021-11-28 — End: 2021-11-28
  Administered 2021-11-28: 325 mg via ORAL
  Filled 2021-11-28: qty 1

## 2021-11-28 MED ORDER — ATORVASTATIN CALCIUM 40 MG PO TABS
40.0000 mg | ORAL_TABLET | Freq: Every day | ORAL | Status: DC
Start: 2021-11-28 — End: 2021-11-30
  Administered 2021-11-28 – 2021-11-30 (×3): 40 mg via ORAL
  Filled 2021-11-28 (×3): qty 1

## 2021-11-28 MED ORDER — FLUOXETINE HCL 10 MG PO CAPS
10.0000 mg | ORAL_CAPSULE | Freq: Every day | ORAL | Status: DC
Start: 2021-11-28 — End: 2021-11-30
  Administered 2021-11-29 – 2021-11-30 (×2): 10 mg via ORAL
  Filled 2021-11-28 (×3): qty 1

## 2021-11-28 MED ORDER — ASPIRIN 81 MG PO TBEC
81.0000 mg | DELAYED_RELEASE_TABLET | Freq: Every day | ORAL | Status: DC
Start: 2021-11-29 — End: 2021-11-30
  Administered 2021-11-29 – 2021-11-30 (×2): 81 mg via ORAL
  Filled 2021-11-28 (×2): qty 1

## 2021-11-28 NOTE — ED Provider Triage Note (Signed)
Emergency Medicine Provider Triage Evaluation Note  Amy Sheppard , a 81 y.o. female  was evaluated in triage.  Pt complains of left-sided vision loss.  Patient reports that symptoms started last night while she was watching TV 8:00.  Went to the retinal specialist today and they were concern for retinal artery occlusion.  Sent patient to the ER for stroke work-up.  Patient reports history of hypertension but did take her blood pressure medicine this morning.  Patient denies any other focal neurological symptoms.  Review of Systems  Positive: Left-sided visual loss Negative: Headache, chest pain, shortness of breath, paresthesias, aphasia, weakness ataxia  Physical Exam  BP (!) 213/152   Pulse (!) 57   Temp 98.7 F (37.1 C) (Oral)   Resp 20   Ht 5\' 2"  (1.575 m)   Wt 88.5 kg   SpO2 97%   BMI 35.67 kg/m  Gen:   Awake, no distress   Resp:  Normal effort  MSK:   Moves extremities without difficulty  Other:  The patient is alert, attentive, and oriented x 3. Speech is clear. Cranial nerve II-VII grossly intact. Negative pronator drift. Sensation intact. Strength 5/5 in all extremities. Rapid alternating movement and fine finger movements intact.   Heart regular rate and rhythm.  Lungs clear to auscultation bilaterally.  Medical Decision Making  Medically screening exam initiated at 1:23 PM.  Appropriate orders placed.  Amy Sheppard was informed that the remainder of the evaluation will be completed by another provider, this initial triage assessment does not replace that evaluation, and the importance of remaining in the ED until their evaluation is complete.  81 year old presents for monocular visual loss in the left eye.  Patient seen by retinal specialist today and sent to the ER for stroke work-up for concern for retinal artery occlusion.  Patient no focal neurological deficit on exam except for complete visual loss to left eye.  Patient is hypertensive in triage.  Did take her blood  pressure medication this morning.  Denies headache, chest pain or shortness of breath.  Will need stroke work-up patient is outside the window for any acute intervention at this time patient is 77 negative given that she has no weakness.   Amy Niece, PA-C 11/28/21 1330

## 2021-11-28 NOTE — Consult Note (Signed)
Neurology Consultation  Reason for Consult: CRAO from Retina Specialist Referring Physician: Jeraldine Loots  CC: Left eye vision loss  History is obtained from:Patient, Sister  HPI: Amy Sheppard is a 81 y.o. female with a past medical history of GERD, HLD, HTN, lymphocytic colitis, and migraine presenting with vision loss in her left eye starting at about 2000 on 11/28/2021. She was watching TV when she noticed her vision becoming blurry. She covered her right eye and was unable to see out of her left. Her optometrist sent her to a retina specialist who noted an retina whitening and an embolus at the disc of her left eye after eye dilation. She was then sent to the ED for a CRAO and stroke work up. She states that her vision in her left eye currently has one spot down low that she can see blurry movements. She states "it is like looking out of a peephole." She has never had a stroke in the past. She does take aspirin EC 81mg , simvastatin, and atenolol. She has not had any dizziness, numbness, tingling, or falls recently.   ROS: Full ROS was performed and is negative except as noted in the HPI.   Past Medical History:  Diagnosis Date   GERD (gastroesophageal reflux disease)    High cholesterol    Hypertension    Lymphocytic colitis    Migraine    "never anymore; it's been awhile since I've had one" (11/01/2015)   UTI (lower urinary tract infection) X 1    0-Completely asymptomatic and back to baseline post- stroke   No family history on file.  Social History:   reports that she has quit smoking. Her smoking use included cigarettes. She has a 30.00 pack-year smoking history. She has never used smokeless tobacco. She reports that she does not drink alcohol and does not use drugs.  Medications No current facility-administered medications for this encounter.  Current Outpatient Medications:    aspirin EC 81 MG tablet, Take 81 mg by mouth at bedtime., Disp: , Rfl:    atenolol (TENORMIN) 25 MG  tablet, Take 25 mg by mouth daily., Disp: , Rfl:    gabapentin (NEURONTIN) 300 MG capsule, Take 300 mg by mouth 2 (two) times daily., Disp: , Rfl:    omeprazole (PRILOSEC) 40 MG capsule, Take 40 mg by mouth daily., Disp: , Rfl:    potassium chloride (K-DUR,KLOR-CON) 10 MEQ tablet, Take 10 mEq by mouth daily., Disp: , Rfl:    simvastatin (ZOCOR) 20 MG tablet, Take 20 mg by mouth every evening., Disp: , Rfl:    Vitamins/Minerals TABS, Take 1 tablet by mouth daily., Disp: , Rfl:   Exam: Current vital signs: BP (!) 192/93   Pulse (!) 58   Temp 98.7 F (37.1 C) (Oral)   Resp 17   Ht 5\' 2"  (1.575 m)   Wt 88.5 kg   SpO2 98%   BMI 35.67 kg/m  Vital signs in last 24 hours: Temp:  [98.7 F (37.1 C)] 98.7 F (37.1 C) (09/05 1309) Pulse Rate:  [55-61] 58 (09/05 1500) Resp:  [17-20] 17 (09/05 1500) BP: (181-213)/(91-152) 192/93 (09/05 1500) SpO2:  [95 %-98 %] 98 % (09/05 1500) Weight:  [88.5 kg] 88.5 kg (09/05 1311)  GENERAL: Awake, alert in NAD HEENT: - Normocephalic and atraumatic, dry mm, no LN++, no Thyromegally LUNGS - Clear to auscultation bilaterally with no wheezes CV - S1S2 RRR, no m/r/g, equal pulses bilaterally. ABDOMEN - Soft, nontender, nondistended with normoactive BS Ext: warm, well perfused,  intact peripheral pulses,no edema  NEURO:  Mental Status: AA&Ox3  Language: speech is clear.  Naming, repetition, fluency, and comprehension intact. Cranial Nerves: PERRL 5 mm dilated (post ophthalmology eval). EOMI, visual field full with right eye, blurry vision in left eye in lower inferior aspect of the visual field, unable to identify fingers from hand, but able to identify movement, no facial asymmetry, facial sensation intact, hearing intact, tongue/uvula/soft palate midline, normal sternocleidomastoid and trapezius muscle strength. No evidence of tongue atrophy or fibrillations Motor: 5/5 strength in all extremities Tone: is normal and bulk is normal Sensation- Intact to light  touch bilaterally Coordination: FTN intact bilaterally, no ataxia in BLE. Gait- steady  Labs I have reviewed labs in epic and the results pertinent to this consultation are:  CBC    Component Value Date/Time   WBC 5.1 11/28/2021 1334   RBC 3.93 11/28/2021 1334   HGB 11.9 (L) 11/28/2021 1334   HCT 37.0 11/28/2021 1334   PLT 265 11/28/2021 1334   MCV 94.1 11/28/2021 1334   MCH 30.3 11/28/2021 1334   MCHC 32.2 11/28/2021 1334   RDW 13.1 11/28/2021 1334   LYMPHSABS 0.9 11/28/2021 1334   MONOABS 0.5 11/28/2021 1334   EOSABS 0.1 11/28/2021 1334   BASOSABS 0.1 11/28/2021 1334    CMP     Component Value Date/Time   NA 139 11/28/2021 1334   K 4.0 11/28/2021 1334   CL 101 11/28/2021 1334   CO2 29 11/28/2021 1334   GLUCOSE 119 (H) 11/28/2021 1334   BUN 7 (L) 11/28/2021 1334   CREATININE 0.77 11/28/2021 1334   CALCIUM 9.5 11/28/2021 1334   PROT 6.7 11/28/2021 1334   ALBUMIN 3.7 11/28/2021 1334   AST 17 11/28/2021 1334   ALT 14 11/28/2021 1334   ALKPHOS 46 11/28/2021 1334   BILITOT 0.9 11/28/2021 1334   GFRNONAA >60 11/28/2021 1334   GFRAA >60 05/30/2016 0803    Lipid Panel  No results found for: "CHOL", "TRIG", "HDL", "CHOLHDL", "VLDL", "LDLCALC", "LDLDIRECT"   Imaging I have reviewed the images obtained: CT-head No acute intracranial abnormality. Stable severe chronic small vessel disease. CTA Head and Neck  Pending MRI examination of the brain 1. No acute intracranial pathology. 2. Extensive FLAIR signal abnormality in the supratentorial brain likely reflecting sequela of advanced chronic white matter microangiopathy.  Assessment:  81 y.o. female with a past medical history of GERD, HLD, HTN, lymphocytic colitis, and migraine presenting with vision loss in her left eye starting at about 2000 on 11/28/2021. Marland Kitchen Her optometrist sent her to a retina specialist who noted an retina whitening and an embolus at the disc of her left eye.   Impression: Left  CRAO  Recommendations: Concern for left CRAO per ophthalmology Preliminary recommendations: - Stroke labs HgbA1c, fasting lipid panel - MRI brain w/o - CTA head and neck - Frequent neuro checks - Echocardiogram - Prophylactic therapy-Antiplatelet med: Aspirin - dose 325mg  PO or 300mg  PR, followed by 81 mg daily - Plavix 300 mg load with 75 mg daily course TBD pending vessel imaging  - Risk factor modification - Telemetry monitoring; 30 day event monitor on discharge if no arrythmias captured  - Blood pressure goal   - Permissive hypertension to 220/120 due to acute stroke for now - PT consult, OT consult, Speech consult, unless patient is back to baseline - Stroke team to follow in consultation, admit to medicine  Patient seen and examined by NP/APP with MD. MD to update note as needed.  Elmer Picker, DNP, FNP-BC [facility] Pager: [phone]  Attending Neurologist's note:  I personally saw this patient, gathering history, performing a full neurologic examination, reviewing relevant labs, personally reviewing relevant imaging including MRI brain and head CT, and formulated the assessment and plan, adding the note above for completeness and clarity to accurately reflect my thoughts   Brooke Dare MD-PhD [facility] [phone] Available 7 AM to 7 PM, outside these hours please contact Neurologist on call listed on AMION

## 2021-11-28 NOTE — ED Triage Notes (Signed)
Pt sent from retina specialists due to left eye having vision loss since yesterday at 8 pm. Pt was watching tv when her vision went out. Sent for retina stroke and a workup. Pt hypertensive in triage, reports she took her meds this morning. No weakness or facial droop.

## 2021-11-28 NOTE — Hospital Course (Addendum)
81 year old female with a PMHx of hypertension, hypercholesterolemia, and breast cancer (s/p lumpectomy and radiation, on letrozole) presented with acute onset unilateral vision loss and admitted for admitted for stroke r/o.   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 Admitted for stroke work up. Denies any weakness, numbness, tingling, or other focal neurological deficits. 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 Head CT demonstrates no acute pathology with stable severe chronic small vessel disease. CT angio head and neck demonstrated no emergent large vessel occlusion, but approximately 40% proximal left ICA stenosis. This is most likely source of her retinal artery occlusion. ESR/CRP only mildly elevated with no jaw pain or temporal tenderness making temporal arteritis less likely.  Echo performed to rule out cardioembolic source. Demonstrated EF 55-60%, mild LVH, grade 1 diastolic dysfunction. No thrombus.  Per neurology recommendation, placed on Asprin 81 mg daily and Plavix 75 mg daily. Plan to continue therapy for 3 weeks before continuing aspirin alone.  Vascular surgery consulted. Given morphology of the plaque in her left ICA that's likely source of her retinal artery occlusion, feel treating it will minimize risk for future events. TCAR scheduled for 09/13. Will remain on aspirin, statin, and Plavix.   Hypertension History of hypertension treated with atenolol 25 mg daily and lisinopril 40 mg daily. Hypertensive on arrival at 213/152. Per neurology, permissive hypertension to 220/120. Restarted lisinopril with slight improvement in hypertension.  Hypercholesterolemia Pt notes history of high cholesterol. Could be related to most resent ischemic event. Per chart review, she  is on simvastatin 20 mg daily. LDL 89, HDL 45, TGL elevated at 213, and VLDL elevated at 43. Goal LDL <70. Started on atorvastatin 40 mg daily.   Hx of breast cancer  Pt diagnosed with breast cancer last year. States she had a lumpectomy followed by radiation and has been on letrozole at home. Continued home letrozole 2.5 mg daily.

## 2021-11-28 NOTE — H&P (Signed)
. Date: 11/28/2021               Patient Name:  Amy Sheppard MRN: [id number]  DOB: Oct 07, 1940 Age / Sex: 81 y.o., female   PCP: Cathlean Sauer, MD              Medical Service: Internal Medicine Teaching Service              Attending Physician: Dr. Carmin Muskrat, MD    First Contact: Hennie Duos, MS 3 Pager: [phone]  Second Contact: Dr. Claudia Desanctis Denicia Pagliarulo Pager: [phone]            After Hours (After 5p/  First Contact Pager: [phone]  weekends / holidays): Second Contact Pager: [phone]   Chief Complaint: Unilateral vision loss  History of Present Illness:  81 year old female with a PMHx of hypertension, hypercholesterolemia, and breast cancer (s/p lumpectomy and radiation, on letrozole) presents for evaluation of unilateral vision loss.  Pt reports she was watching TV around 20:15 last night when the vision in her left eye became white/cloudy. She states that shortly after her vision became completely black in her left eye. States that the vision in her right eye has remained at baseline. Patient has been unable to see out of her left eye since symptom onset.   Patient states that she had an appointment with her optometrist Dr. Chapman Fitch this morning. She states that Dr. Chapman Fitch was concerned for a blood clot causing her symptoms and referred her to an ophthalmologist in [place] today. She states that she then visited the ophthalmologist (Dr. Jule Ser) today who suspected central retinal artery occlusion. States she has also received eyedrops today at her appointments that have dilated her eyes.   She was sent to the ED for further work up of her symptoms. She denies history of similar symptoms in the past and states she's never had a CVA or TIA previously. Endorses history of bilateral cataract surgery in July and August of 2023 with no post-op complications. She states that she still experiences mild sensitivity to light despite these surgeries. She does wear glasses at home for reading.    Denies associated eye pain, headache, jaw pain, syncope, numbness, tingling, weakness, facial droop, photosensitivity, nausea, vomiting, chest pain, palpitation. Denies history of DVT or PE. Denies any recent falls or trauma. Denies taking blood thinners.   Denies personal history of DM, heart disease, MI, stroke, blood clots.   Meds:  Pt unable to recall all her meds. List below obtained per chart review  - letrozole 2.18m daily  - lisinopril 40 mg daily - atenolol 25 mg daily - aspirin 81 mg daily - gabapentin 300 mg x2 daily - omperazole 40 mg daily - potassium chloride 10 mEq daily - simvastatin 20 mg daily - ferosul 325 mg x2 daily - fluoxetine 10 mg daily - calcium PO 1 tablet daily - daily vitamins  No outpatient medications have been marked as taking for the 11/28/21 encounter ([facility]     Allergies: Allergies as of 11/28/2021 - Review Complete 11/28/2021  Allergen Reaction Noted   Tetanus toxoids Swelling 03/22/2012   Past Medical History:  Diagnosis Date   GERD (gastroesophageal reflux disease)    High cholesterol    Hypertension    Lymphocytic colitis    Migraine    "never anymore; it's been awhile since I've had one" (11/01/2015)   UTI (lower urinary tract infection) X 1    Family History:  Mother (Breast Cancer w/ mastectomy, multiple  TIA)   Social History:  Pt lives at home by herself in [place]. She does have sisters, grand kids, and a daughter in law who lives nearby. She is able to perform ADL without assistance. She is retired and works one day a week at [employer]. Denies illicit drug use. Smoked cigarettes 30 years ago (1 PPD for several years, currently not smoking), no alcohol use.  Review of Systems: A complete ROS was negative except as per HPI.   Physical Exam:  GENERAL:     No acute distress, resting comfortably in bed HENT:     Head: Normocephalic and atraumatic. No tenderness to palpation of the temples  bilaterally.    Mouth/Throat:     Mouth: Mucous membranes are moist.  Eyes:     Extraocular Movements: Extraocular movements intact.     Pupils: Pupils dilated at 6 mm and non-reactive to light bilaterally. Normal visual field testing of the right eye. Visual field testing of the left eye demonstrates complete lack of vision.  Cardiovascular:     Rate and Rhythm: Normal rate and regular rhythm.     Pulses: Normal pulses.     Heart sounds: Normal heart sounds.  Pulmonary:     Effort: Pulmonary effort is normal.     Breath sounds: Normal breath sounds.  Musculoskeletal:        General: No swelling. Normal range of motion.  Skin:    General: Skin is warm and dry.  Neurological:     General: No focal deficit present.     Mental Status: Alert and oriented to person, place, and time.     Cranial Nerves: No cranial nerve deficit.     Sensory: No sensory deficit.     Motor: No weakness.  Psychiatric:        Mood and Affect: Mood normal.    EKG: personally reviewed my interpretation is bradycardia.   CXR: Patient did not receive CXR  Assessment & Plan by Problem:  82 year old female with a PMHx of hypertension, hypercholesterolemia, and breast cancer (s/p lumpectomy and radiation, on letrozole) presented with acute onset unilateral vision loss and admitted for admitted for stroke r/o.   Central retinal artery occlusion Stroke r/o Suspected central retinal artery occlusion per ophthalmologist Dr. Sol Blazing clinic note from today. Presents with complete left-sided vision loss. No other neurological symptoms on admission. Visual field testing demonstrates complete vision loss out of the left eye. Neuro exam is otherwise normal. Concern for potential stroke given history of hypertension and hypercholesterolemia.  Patient also not on blood thinners. MRI revealed no intracranial pathology with FLAIR signal abnormality in the supratentorial brain likely reflecting sequela of advanced chronic  white matter microangiopathy. Head CT demonstrates no acute pathology with stable severe chronic small vessel disease. CT angio head and neck demonstrates no emergent large vessel occlusion or proximal hemodynamically significant stenosis intracranially. Demonstrates approximately 40% proximal left ICA stenosis. Less likely to be temporal arteritis given lack of jaw pain or tenderness to palpation of the temples bilaterally but could potentially explain the patients symptoms.  > Echocardiogram complete bubble study ordered > ESR/CRP  > asprin 81 mg daily > Neurology consult > PT/OT evaluation  Hypertension Pt with history of hypertension. Was hypertensive on arrival with last BP of 192/93. Unable to recall her medications. Per chart review, she is on atenolol 25 mg daily and lisinopril 40 mg daily. > Permissive hypertension to 220/120 per neuro > Consider restarting home medications if BP worsening  Hypercholesterolemia Pt notes history of high cholesterol. Could be related to most resent ischemic event. Per chart review, she is on simvastatin 20 mg daily.  > atorvastatin 40 mg daily > lipid panel  Hx of breast cancer  Pt diagnosed with breast cancer last year. States she had a lumpectomy followed by radiation. > Continue home letrozole 2.5 mg daily    Dispo: Admit patient to Inpatient with expected length of stay greater than 2 midnights.  Signed: Hennie Duos I, Medical Student 11/28/2021, 4:08 PM  Pager: _0 @   Attestation for Student Documentation:  I personally was present and performed or re-performed the history, physical exam and medical decision-making activities of this service and have verified that the service and findings are accurately documented in the student's note.  Starlyn Skeans, MD 11/28/2021, 7:24 PM

## 2021-11-28 NOTE — ED Provider Notes (Signed)
[facility] EMERGENCY DEPARTMENT Provider Note   CSN: [id number] Arrival date & time: 11/28/21  1249     History  Chief Complaint  Patient presents with  . Loss of Vision    Amy Sheppard is a 81 y.o. female.  With history of hypertension, migraines, GERD who presents to the ED for evaluation of possible retinal stroke.  Patient states that she was watching TV last night at approximately 8 PM when she noticed blurred vision.  She states she shortly after started pain close attention and realized that she could not see anything out of the left eye.  She made an optometry appointment for today, optometry sent her to [place] [facility].  She saw Dr. Lelan Pons at [facility] specialists who reportedly told her that she has possibly had a retinal stroke.  He told her that there is nothing for them to do about this, but that she should present to the ED for further evaluation of strokes in other regions of the brain.  At this time patient states she is still unable to see out of her left eye.  Denies eye pain, headaches, dizziness, syncope, chest pain, shortness of breath, fevers, chills, abdominal pain, nausea, vomiting, diarrhea, weakness, slurred speech.  States her blood pressure medications were recently changed.  She was taking lisinopril hydrochlorothiazide, reports that she discontinued the hydrochlorothiazide but was started on a different diuretic.  Does not know the medication.  Reports she has been taking her medications as prescribed.  HPI     Home Medications Prior to Admission medications   Medication Sig Start Date End Date Taking? Authorizing Provider  aspirin EC 81 MG tablet Take 81 mg by mouth at bedtime.    [provider]  atenolol (TENORMIN) 25 MG tablet Take 25 mg by mouth daily.    [provider]  gabapentin (NEURONTIN) 300 MG capsule Take 300 mg by mouth 2 (two) times daily. 06/09/15 06/08/16  [provider]   omeprazole (PRILOSEC) 40 MG capsule Take 40 mg by mouth daily.    [provider]  potassium chloride (K-DUR,KLOR-CON) 10 MEQ tablet Take 10 mEq by mouth daily.    [provider]  simvastatin (ZOCOR) 20 MG tablet Take 20 mg by mouth daily at 6 PM.    [provider]  Vitamins/Minerals TABS Take 1 tablet by mouth daily.    [provider]      Allergies    Tetanus toxoids    Review of Systems   Review of Systems  Eyes:  Positive for visual disturbance. Negative for pain and redness.  All other systems reviewed and are negative.   Physical Exam Updated Vital Signs BP (!) 213/152   Pulse (!) 57   Temp 98.7 F (37.1 C) (Oral)   Resp 20   Ht 5\' 2"  (1.575 m)   Wt 88.5 kg   SpO2 97%   BMI 35.67 kg/m  Physical Exam Vitals and nursing note reviewed.  Constitutional:      General: She is not in acute distress.    Appearance: Normal appearance. She is well-developed. She is not ill-appearing.  HENT:     Head: Normocephalic and atraumatic.     Right Ear: Tympanic membrane normal.     Left Ear: Tympanic membrane normal.  Eyes:     Conjunctiva/sclera: Conjunctivae normal.     Comments: Bilateral pupils dilated on initial examination, patient had a dilated retinal exam prior to arrival.  Cardiovascular:  Rate and Rhythm: Normal rate and regular rhythm.     Pulses: Normal pulses.     Heart sounds: Normal heart sounds. No murmur heard. Pulmonary:     Effort: Pulmonary effort is normal. No respiratory distress.     Breath sounds: Normal breath sounds. No wheezing.  Abdominal:     Palpations: Abdomen is soft.     Tenderness: There is no abdominal tenderness.  Musculoskeletal:        General: No swelling.     Cervical back: Neck supple.  Skin:    General: Skin is warm and dry.     Capillary Refill: Capillary refill takes less than 2 seconds.  Neurological:     General: No focal deficit present.     Mental Status: She is alert and oriented  to person, place, and time.     Comments: No facial asymmetry, no slurred speech, no unilateral or global weakness, strength 5 out of 5 in all extremities, no pronator drift  Psychiatric:        Mood and Affect: Mood normal.     ED Results / Procedures / Treatments   Labs (all labs ordered are listed, but only abnormal results are displayed) Labs Reviewed  CBC - Abnormal; Notable for the following components:      Result Value   Hemoglobin 11.9 (*)    All other components within normal limits  RESP PANEL BY RT-PCR (FLU A&B, COVID) ARPGX2  DIFFERENTIAL  PROTIME-INR  APTT  COMPREHENSIVE METABOLIC PANEL  ETHANOL  RAPID URINE DRUG SCREEN, HOSP PERFORMED  URINALYSIS, ROUTINE W REFLEX MICROSCOPIC  CBG MONITORING, ED    EKG None  Radiology CT HEAD WO CONTRAST  Result Date: 11/28/2021 CLINICAL DATA:  Vision loss.  Monocular left visual change. EXAM: CT HEAD WITHOUT CONTRAST TECHNIQUE: Contiguous axial images were obtained from the base of the skull through the vertex without intravenous contrast. RADIATION DOSE REDUCTION: This exam was performed according to the departmental dose-optimization program which includes automated exposure control, adjustment of the mA and/or kV according to patient size and/or use of iterative reconstruction technique. COMPARISON:  05/30/2016 FINDINGS: Brain: No evidence of intracranial hemorrhage, acute infarction, hydrocephalus, extra-axial collection, or mass lesion/mass effect. 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 Vascular:  No hyperdense vessel or other acute findings. Skull: No evidence of fracture or other significant bone abnormality. Sinuses/Orbits:  No acute findings. Other: None. IMPRESSION: No acute intracranial abnormality. Stable severe chronic small vessel disease. Electronically Signed   By: Danae Orleans M.D.   On: 11/28/2021 13:51    Procedures Procedures    Medications Ordered in ED Medications - No data to display  ED  Course/ Medical Decision Making/ A&P Clinical Course as of 11/28/21 1614  Tue Nov 28, 2021  1432 CT HEAD WO CONTRAST I personally reviewed and interpreted the image.  No acute intracranial abnormality, specifically no obvious bleeding or midline shift [AS]  1455 Spoke with neurology Dr. Iver Nestle who suggested MRI brain without contrast, CTA head and neck admitted to hospitalist.  Also recommended maintenance of blood pressure to be less than 220 over less than 120 [AS]  1526 Spoke with hospitalist Dr. Criselda Peaches who admit for further stroke evaluation [AS]    Clinical Course User Index [AS] Adreona Brand, Edsel Petrin, PA-C                           Medical Decision Making Amount and/or Complexity of Data Reviewed Labs:  ordered. Radiology: ordered.  This patient presents to the ED for concern of left-sided vision loss, this involves an extensive number of treatment options, and is a complaint that carries with it a high risk of complications and morbidity.  The differential diagnosis includes macular degeneration, retinal detachment, retinal stroke, other ischemic and hemorrhagic stroke   Co morbidities that complicate the patient evaluation  .Hypertension  My initial workup includes  Additional history obtained from: Nursing notes from this visit.  I ordered, reviewed and interpreted labs which include: UDS, APTT, CMP.  CBC, INR, ethanol.  All labs within normal limits.   I ordered imaging studies including CT head without contrast, CTA head and neck, MRI brain without contrast I independently visualized and interpreted imaging which showed CT head without contrast showed no obvious ischemia or bleed.  CTA head and neck and MRI brain pending at the time of admission I agree with the radiologist interpretation  Cardiac Monitoring:  .The patient was maintained on a cardiac monitor.  I personally viewed and interpreted the cardiac monitored which showed an underlying rhythm of: Sinus  bradycardia  Consultations Obtained:  I requested consultation with the Neurology Dr. Iver Nestle,  and discussed lab and imaging findings as well as pertinent plan - they recommend: CTA head and neck, MRI brain without contrast, maintenance of blood pressure should be less than 220 systolic over less than 120 diastolic.  Admit to hospitalist.  Afebrile, hypotensive with blood pressures in the systolic range of 190 over Diastolic of 90.  Neurology suggested keeping blood pressure less than 220 over less than 120.  Patient is complaining of left-sided blindness, otherwise has no complaints. patient was admitted to hospitalist Dr. Criselda Peaches for further stroke work-up and formal neurology consultation for left-sided retinal stroke.   Patient's case discussed with Dr. Jeraldine Loots who agrees with plan to consult neurology and admit to medicine.  Note: Portions of this report may have been transcribed using voice recognition software. Every effort was made to ensure accuracy; however, inadvertent computerized transcription errors may still be present.          Final Clinical Impression(s) / ED Diagnoses Final diagnoses:  None    Rx / DC Orders ED Discharge Orders     None         Michelle Piper, Cordelia Poche 11/28/21 1615    Gerhard Munch, MD 11/29/21 1659

## 2021-11-29 ENCOUNTER — Observation Stay (HOSPITAL_COMMUNITY): Payer: Medicare Other

## 2021-11-29 ENCOUNTER — Other Ambulatory Visit: Payer: Self-pay

## 2021-11-29 ENCOUNTER — Encounter (HOSPITAL_COMMUNITY): Payer: Self-pay | Admitting: Internal Medicine

## 2021-11-29 DIAGNOSIS — E7800 Pure hypercholesterolemia, unspecified: Secondary | ICD-10-CM

## 2021-11-29 DIAGNOSIS — Z87891 Personal history of nicotine dependence: Secondary | ICD-10-CM | POA: Diagnosis not present

## 2021-11-29 DIAGNOSIS — Z20822 Contact with and (suspected) exposure to covid-19: Secondary | ICD-10-CM | POA: Diagnosis present

## 2021-11-29 DIAGNOSIS — Z803 Family history of malignant neoplasm of breast: Secondary | ICD-10-CM | POA: Diagnosis not present

## 2021-11-29 DIAGNOSIS — Z79899 Other long term (current) drug therapy: Secondary | ICD-10-CM | POA: Diagnosis not present

## 2021-11-29 DIAGNOSIS — I10 Essential (primary) hypertension: Secondary | ICD-10-CM

## 2021-11-29 DIAGNOSIS — H5462 Unqualified visual loss, left eye, normal vision right eye: Secondary | ICD-10-CM | POA: Diagnosis present

## 2021-11-29 DIAGNOSIS — H3412 Central retinal artery occlusion, left eye: Secondary | ICD-10-CM

## 2021-11-29 DIAGNOSIS — E669 Obesity, unspecified: Secondary | ICD-10-CM | POA: Diagnosis present

## 2021-11-29 DIAGNOSIS — Z6835 Body mass index (BMI) 35.0-35.9, adult: Secondary | ICD-10-CM | POA: Diagnosis not present

## 2021-11-29 DIAGNOSIS — I6529 Occlusion and stenosis of unspecified carotid artery: Secondary | ICD-10-CM | POA: Diagnosis not present

## 2021-11-29 DIAGNOSIS — R7982 Elevated C-reactive protein (CRP): Secondary | ICD-10-CM | POA: Diagnosis present

## 2021-11-29 DIAGNOSIS — Z7982 Long term (current) use of aspirin: Secondary | ICD-10-CM | POA: Diagnosis not present

## 2021-11-29 DIAGNOSIS — I6521 Occlusion and stenosis of right carotid artery: Secondary | ICD-10-CM | POA: Diagnosis not present

## 2021-11-29 DIAGNOSIS — I6522 Occlusion and stenosis of left carotid artery: Secondary | ICD-10-CM

## 2021-11-29 DIAGNOSIS — I6523 Occlusion and stenosis of bilateral carotid arteries: Secondary | ICD-10-CM | POA: Diagnosis present

## 2021-11-29 DIAGNOSIS — I2510 Atherosclerotic heart disease of native coronary artery without angina pectoris: Secondary | ICD-10-CM | POA: Diagnosis present

## 2021-11-29 DIAGNOSIS — H3410 Central retinal artery occlusion, unspecified eye: Secondary | ICD-10-CM | POA: Diagnosis present

## 2021-11-29 DIAGNOSIS — Z888 Allergy status to other drugs, medicaments and biological substances status: Secondary | ICD-10-CM | POA: Diagnosis not present

## 2021-11-29 DIAGNOSIS — Z923 Personal history of irradiation: Secondary | ICD-10-CM | POA: Diagnosis not present

## 2021-11-29 DIAGNOSIS — K529 Noninfective gastroenteritis and colitis, unspecified: Secondary | ICD-10-CM | POA: Diagnosis present

## 2021-11-29 DIAGNOSIS — Z853 Personal history of malignant neoplasm of breast: Secondary | ICD-10-CM | POA: Diagnosis not present

## 2021-11-29 DIAGNOSIS — Z79811 Long term (current) use of aromatase inhibitors: Secondary | ICD-10-CM | POA: Diagnosis not present

## 2021-11-29 DIAGNOSIS — K219 Gastro-esophageal reflux disease without esophagitis: Secondary | ICD-10-CM | POA: Diagnosis present

## 2021-11-29 DIAGNOSIS — I700 Atherosclerosis of aorta: Secondary | ICD-10-CM | POA: Diagnosis present

## 2021-11-29 DIAGNOSIS — Z23 Encounter for immunization: Secondary | ICD-10-CM | POA: Diagnosis present

## 2021-11-29 DIAGNOSIS — G43909 Migraine, unspecified, not intractable, without status migrainosus: Secondary | ICD-10-CM | POA: Diagnosis present

## 2021-11-29 LAB — ECHOCARDIOGRAM COMPLETE BUBBLE STUDY
AR max vel: 2.13 cm2
AV Peak grad: 11.8 mmHg
Ao pk vel: 1.72 m/s
Area-P 1/2: 2.46 cm2
S' Lateral: 1.5 cm

## 2021-11-29 LAB — BASIC METABOLIC PANEL
Anion gap: 13 (ref 5–15)
BUN: 9 mg/dL (ref 8–23)
CO2: 28 mmol/L (ref 22–32)
Calcium: 9.5 mg/dL (ref 8.9–10.3)
Chloride: 95 mmol/L — ABNORMAL LOW (ref 98–111)
Creatinine, Ser: 0.83 mg/dL (ref 0.44–1.00)
GFR, Estimated: >60 mL/min (ref >60)
Glucose, Bld: 145 mg/dL — ABNORMAL HIGH (ref 70–99)
Potassium: 3.8 mmol/L (ref 3.5–5.1)
Sodium: 136 mmol/L (ref 135–145)

## 2021-11-29 LAB — CBC
HCT: 37.4 % (ref 36.0–46.0)
Hemoglobin: 12.5 g/dL (ref 12.0–15.0)
MCH: 30.8 pg (ref 26.0–34.0)
MCHC: 33.4 g/dL (ref 30.0–36.0)
MCV: 92.1 fL (ref 80.0–100.0)
Platelets: 246 10*3/uL (ref 150–400)
RBC: 4.06 MIL/uL (ref 3.87–5.11)
RDW: 12.9 % (ref 11.5–15.5)
WBC: 6.6 10*3/uL (ref 4.0–10.5)
nRBC: 0 % (ref 0.0–0.2)

## 2021-11-29 MED ORDER — INFLUENZA VAC A&B SA ADJ QUAD 0.5 ML IM PRSY
0.5000 mL | PREFILLED_SYRINGE | INTRAMUSCULAR | Status: AC
Start: 2021-11-30 — End: 2021-11-30
  Administered 2021-11-30: 0.5 mL via INTRAMUSCULAR
  Filled 2021-11-29: qty 0.5

## 2021-11-29 MED ORDER — ORAL CARE MOUTH RINSE: 15.0000 mL | OROMUCOSAL | Status: DC | PRN

## 2021-11-29 MED ORDER — LISINOPRIL 20 MG PO TABS
40.0000 mg | ORAL_TABLET | Freq: Every day | ORAL | Status: DC
  Administered 2021-11-29 – 2021-11-30 (×2): 40 mg via ORAL
  Filled 2021-11-29 (×2): qty 2

## 2021-11-29 NOTE — Progress Notes (Signed)
Received from ED into room 3W 20.  Oriented to room and surroundings.  Given patient guide detailing rights, responsibilities, and visitor guidelines.

## 2021-11-29 NOTE — Plan of Care (Signed)
°  Problem: Education: °Goal: Knowledge of General Education information will improve °Description: Including pain rating scale, medication(s)/side effects and non-pharmacologic comfort measures °Outcome: Progressing °  °Problem: Health Behavior/Discharge Planning: °Goal: Ability to manage health-related needs will improve °Outcome: Progressing °  °Problem: Clinical Measurements: °Goal: Ability to maintain clinical measurements within normal limits will improve °Outcome: Progressing °Goal: Will remain free from infection °Outcome: Progressing °Goal: Diagnostic test results will improve °Outcome: Progressing °Goal: Respiratory complications will improve °Outcome: Progressing °Goal: Cardiovascular complication will be avoided °Outcome: Progressing °  °Problem: Activity: °Goal: Risk for activity intolerance will decrease °Outcome: Progressing °  °Problem: Nutrition: °Goal: Adequate nutrition will be maintained °Outcome: Progressing °  °Problem: Coping: °Goal: Level of anxiety will decrease °Outcome: Progressing °  °Problem: Elimination: °Goal: Will not experience complications related to bowel motility °Outcome: Progressing °Goal: Will not experience complications related to urinary retention °Outcome: Progressing °  °Problem: Pain Managment: °Goal: General experience of comfort will improve °Outcome: Progressing °  °Problem: Safety: °Goal: Ability to remain free from injury will improve °Outcome: Progressing °  °Problem: Skin Integrity: °Goal: Risk for impaired skin integrity will decrease °Outcome: Progressing °  °Problem: Education: °Goal: Knowledge of disease or condition will improve °Outcome: Progressing °Goal: Knowledge of secondary prevention will improve (SELECT ALL) °Outcome: Progressing °Goal: Knowledge of patient specific risk factors will improve (INDIVIDUALIZE FOR PATIENT) °Outcome: Progressing °Goal: Individualized Educational Video(s) °Outcome: Progressing °  °Problem: Coping: °Goal: Will verbalize  positive feelings about self °Outcome: Progressing °  °Problem: Self-Care: °Goal: Ability to participate in self-care as condition permits will improve °Outcome: Progressing °  °

## 2021-11-29 NOTE — Progress Notes (Addendum)
Subjective: Overnight events: none   Pt in NAD. States she stil has no vision in the left eye. Denies any numbness, weakness, or new neurological deficit.    Objective:  Vital signs in last 24 hours: Vitals:   11/29/21 0030 11/29/21 0145 11/29/21 0329 11/29/21 0803  BP: (!) 173/93 (!) 187/90 (!) 153/86 (!) 143/84  Pulse: 63 63 (!) 58 67  Resp: $Remo'18 18 18 20  'QyXwh$ Temp:  98.2 F (36.8 C) (!) 97.5 F (36.4 C) (!) 97.2 F (36.2 C)  TempSrc:  Oral Oral Oral  SpO2: 92% 97% 96% 95%  Weight:      Height:       Weight change:  No intake or output data in the 24 hours ending 11/29/21 1007 GENERAL:     No acute distress, resting comfortably in bed HENT:     Head: Normocephalic and atraumatic. No tenderness to palpation of the temples bilaterally.    Mouth/Throat:     Mouth: Mucous membranes are moist.  Eyes:     Pupils: Pupils dilated bilaterally.  Cardiovascular:     Rate and Rhythm: Normal rate and regular rhythm.     Pulses: Normal pulses.     Heart sounds: Normal heart sounds.  Pulmonary:     Effort: Pulmonary effort is normal.     Breath sounds: Normal breath sounds.  Musculoskeletal:        General: No swelling. Normal range of motion.  Skin:    General: Skin is warm and dry.  Neurological:     General: No focal deficit present.     Mental Status: Alert and oriented to person, place, and time.   Psychiatric:        Mood and Affect: Mood normal.   Assessment/Plan:  Principal Problem:   Central retinal artery occlusion, left  81 year old female with a PMHx of hypertension, hypercholesterolemia, and breast cancer (s/p lumpectomy and radiation, on letrozole) presented with acute onset unilateral vision loss and admitted for admitted for stroke r/o.   Central retinal artery occlusion Stroke work up - no intracranial stroke found on imaging Pt w/ sudden onset left sided vision loss found to have left retinal artery occlusion by ophthalmology. ESR slightly elevated at 48  and CRP at 1. Vision loss less likely to be temporal arteritis given low values of inflammatory markers. Retinal artery occlusion likely from left ICA stenosis found on CT angio, however cannot rule out cardioembolic source. No other signs of neurological deficits to suggest further strokes. Carotid duplex demonstrates right ICA with a 1-39% stenosis and left ICA with 1-39% stenosis. Echo demonstrates EF 55-60%, mild LVH, grade 1 diastolic dysfunction. Neuro recommends dual antiplatelet therapy for 3 weeks then Asprin alone. Vascular surgery consulted.  > asprin 81 mg daily > Plavix $Remove'75mg'gOTEEnp$  daily > Follow up neurology and vascular surgery recommendations  Hypertension Pt with history of hypertension. Was hypertensive on arrival with last BP of of 213/152. BP this morning 173/93. Per chart review, she is on atenolol 25 mg daily and lisinopril 40 mg daily. Initially permissive hypertension to 220/120 per neurology. - Restarted Lisinopril 40 mg today  - Can restart atenolol 25 mg tomorrow if BP remains elevated  Hypercholesterolemia Hx of high cholesterol. Could be related to most resent ischemic event. Per chart review, she is on simvastatin 20 mg daily. Lipid panel reveals LDL 89, HDL 45, TGL elevated at 213, and VLDL elevated at 43. LDL goal below 70. > atorvastatin 40 mg daily  Hx of breast cancer  Pt diagnosed with breast cancer last year. States she had a lumpectomy followed by radiation. > Continue home letrozole 2.5 mg daily   Disposition OT recommends outpatient OT. PT recommends outpatient PT w/ rolling walker.   Hennie Duos I, Medical Student 11/29/2021, 10:07 AM   Attestation for Student Documentation:  I personally was present and performed or re-performed the history, physical exam and medical decision-making activities of this service and have verified that the service and findings are accurately documented in the student's note.  Starlyn Skeans, MD 11/29/2021, 5:30 PM

## 2021-11-29 NOTE — TOC Initial Note (Addendum)
Transition of Care [facility]) - Initial/Assessment Note    Patient Details  Name: Amy Sheppard MRN: [id number] Date of Birth: September 30, 1940  [facility]) CM/SW Contact:    Kermit Balo, RN Phone Number: 11/29/2021, 3:15 PM  Clinical Narrative:                 Patient is from home alone but has supportive family that can stay with her or check on her frequently.  She doesn't use any DME. Pt drives self as needed but has people that can assist with transportation. She manages her own medications and denies any issues. Outpatient therapy recommended. Pt prefers to attend at [facility] outpatient therapy. CM will fax HP the orders. Information on the patients AVS. Walker for home ordered through [employer] and will be delivered to her room. Pt has transportation home when discharged.  Expected Discharge Plan: OP Rehab Barriers to Discharge: Continued Medical Work up   Patient Goals and CMS Choice     Choice offered to / list presented to : Patient  Expected Discharge Plan and Services Expected Discharge Plan: OP Rehab   Discharge Planning Services: CM Consult Post Acute Care Choice: Durable Medical Equipment Living arrangements for the past 2 months: Apartment                 DME Arranged: Walker rolling DME Agency: [employer] Date DME Agency Contacted: 11/29/21   Representative spoke with at DME Agency: Lawernce Keas            Prior Living Arrangements/Services Living arrangements for the past 2 months: Apartment Lives with:: Self Patient language and need for interpreter reviewed:: Yes Do you feel safe going back to the place where you live?: Yes        Care giver support system in place?: Yes (comment)   Criminal Activity/Legal Involvement Pertinent to Current Situation/Hospitalization: No - Comment as needed  Activities of Daily Living Home Assistive Devices/Equipment: None ADL Screening (condition at time of admission) Patient's cognitive  ability adequate to safely complete daily activities?: Yes Is the patient deaf or have difficulty hearing?: No Does the patient have difficulty seeing, even when wearing glasses/contacts?: Yes Does the patient have difficulty concentrating, remembering, or making decisions?: No Patient able to express need for assistance with ADLs?: Yes Does the patient have difficulty dressing or bathing?: No Independently performs ADLs?: Yes (appropriate for developmental age) Does the patient have difficulty walking or climbing stairs?: No Weakness of Legs: None Weakness of Arms/Hands: None  Permission Sought/Granted                  Emotional Assessment Appearance:: Appears stated age Attitude/Demeanor/Rapport: Engaged Affect (typically observed): Accepting Orientation: : Oriented to Self, Oriented to Place, Oriented to  Time, Oriented to Situation   Psych Involvement: No (comment)  Admission diagnosis:  Vision loss of left eye [H54.62] Central retinal artery occlusion, left [H34.12] CRAO (central retinal artery occlusion) [H34.10] Patient Active Problem List   Diagnosis Date Noted   CRAO (central retinal artery occlusion) 11/29/2021   Central retinal artery occlusion, left 11/28/2021   SOB (shortness of breath)    Dehydration    Essential hypertension    Obstructive jaundice 11/01/2015   Transaminitis 11/01/2015   Hypophosphatemia 11/01/2015   Hypokalemia 11/01/2015   Wheezing 11/01/2015   Calculus of bile duct with obstruction and without cholangitis or cholecystitis 10/30/2015   Common bile duct dilatation 10/30/2015   PCP:  Herma Carson, MD Pharmacy:   [facility] [place], [place] - [address] [place] [address] Phone: [phone] Fax: [phone]     Social Determinants of Health (SDOH) Interventions Utilities Interventions: Intervention Not Indicated  Readmission Risk Interventions     No data to display

## 2021-11-29 NOTE — Evaluation (Signed)
Occupational Therapy Evaluation Patient Details Name: Amy Sheppard MRN: [id number] DOB: Mar 27, 1940 Today's Date: 11/29/2021   History of Present Illness Pt is a 81 y.o female presenting to [facility] ED on 11/28/2021 from retina specialists due to L eye vision loss. MRI revealed no acute intracranial pathology. PMH significant for HTN, hypercholestrolemia, and breast cancer.   Clinical Impression   PTA, pt lived alone and was independent in ADL, IADL, and driving. Upon eval, pt presents with decreased vision in L eye, balance difficulties, and decreased use of visual compensatory techniques. Pt performing UB ADL with set-up/supervision, and LB ADL with min guard A. With R eye closed, pt able to describe seeing just below eye level toward the temporal visual field. Pt with max attempts to compensate during visual field testing and with "jumping" eye movements during visual tracking. Recommend OP OT to optimize safety and independence as well as use of visual compensatory techniques during daily occupations.      Recommendations for follow up therapy are one component of a multi-disciplinary discharge planning process, led by the attending physician.  Recommendations may be updated based on patient status, additional functional criteria and insurance authorization.   Follow Up Recommendations  Outpatient OT    Assistance Recommended at Discharge Intermittent Supervision/Assistance  Patient can return home with the following Assistance with cooking/housework;Assist for transportation    Functional Status Assessment  Patient has had a recent decline in their functional status and demonstrates the ability to make significant improvements in function in a reasonable and predictable amount of time.  Equipment Recommendations  None recommended by OT (Pt has recommended equipment)    Recommendations for Other Services       Precautions / Restrictions Precautions Precautions: Fall Precaution Comments: L  eye vision loss Restrictions Weight Bearing Restrictions: No      Mobility Bed Mobility Overal bed mobility: Modified Independent             General bed mobility comments: Coming to EOB with mod I. Managing sheets independently.    Transfers Overall transfer level: Needs assistance Equipment used: Rolling walker (2 wheels) Transfers: Sit to/from Stand Sit to Stand: Min guard           General transfer comment: Min guard A for safety. Mild balance deficits observed      Balance Overall balance assessment: Mild deficits observed, not formally tested                                         ADL either performed or assessed with clinical judgement   ADL Overall ADL's : Needs assistance/impaired Eating/Feeding: Modified independent   Grooming: Oral care;Min guard;Standing Grooming Details (indicate cue type and reason): Min guard A for oral care. Increased time to locate paper towels on L side to dry mouth and clean surace. Upper Body Bathing: Set up;Sitting   Lower Body Bathing: Min guard;Sit to/from stand   Upper Body Dressing : Set up;Sitting   Lower Body Dressing: Min guard;Sit to/from stand Lower Body Dressing Details (indicate cue type and reason): Pulling up socks and reaching toward feet on EOB and at bed level 2x during session. Toilet Transfer: Min guard;Ambulation;Regular Toilet;Rolling walker (2 wheels) Toilet Transfer Details (indicate cue type and reason): Performing with min guard A         Functional mobility during ADLs: Min guard;Rolling walker (2 wheels) General ADL Comments: Min guard A  for safety. Pt could benefit from continued education on safe use of RW     Vision Baseline Vision/History: 1 Wears glasses (for close up vision) Ability to See in Adequate Light: 1 Impaired Patient Visual Report: Other (comment) (Pt reports can only see small spot out of L eye) Vision Assessment?: Yes Eye Alignment: Within Functional  Limits Ocular Range of Motion: Within Functional Limits Tracking/Visual Pursuits: Decreased smoothness of horizontal tracking (Pt with eye "jumping" movements of Bil eyes during horizontal tracking and frequently losing stimulus on R and L but able to quickly relocate) Visual Fields: Other (comment) (Pt compensating during visual field testing, so test was limited. Pt appearing with good peripheral vision in R eye, but only seeing portion of temporal portion of L lower quadrant closer to eye level with L eye.) Depth Perception: Undershoots Additional Comments: Pt performing visual scanning with sticky notes on wall. Increased time and effort for visual scanning to locate correct number/letters. Pt with good technique of top to bottom scanning; observed to scan from R to L     Perception     Praxis      Pertinent Vitals/Pain Pain Assessment Pain Assessment: No/denies pain     Hand Dominance     Extremity/Trunk Assessment Upper Extremity Assessment Upper Extremity Assessment: Overall WFL for tasks assessed   Lower Extremity Assessment Lower Extremity Assessment: Defer to PT evaluation       Communication Communication Communication: No difficulties   Cognition Arousal/Alertness: Awake/alert Behavior During Therapy: WFL for tasks assessed/performed Overall Cognitive Status: Within Functional Limits for tasks assessed                                 General Comments: Pt appears to be near baseline with cognition. Daughter in law present and not reporting any concerns. Seems to have mild memory deficits, but believe this may be baseline.     General Comments  VSS. Daughter in law present    Exercises     Shoulder Instructions      Home Living Family/patient expects to be discharged to:: Private residence Living Arrangements: Alone Available Help at Discharge: Family;Available PRN/intermittently Type of Home:  [facility]) Home Access: Stairs to  enter Secretary/administrator of Steps: 2 Entrance Stairs-Rails: Can reach both Home Layout: One level     Bathroom Shower/Tub: Producer, television/film/video: Handicapped height     Home Equipment:  (shower stool)   Additional Comments: walk in shower, shower stool.      Prior Functioning/Environment Prior Level of Function : Independent/Modified Independent               ADLs Comments: Pt reports independent in ADL, IADL, and driving.        OT Problem List: Decreased activity tolerance;Impaired balance (sitting and/or standing);Impaired vision/perception;Decreased coordination      OT Treatment/Interventions: Self-care/ADL training;Therapeutic exercise;DME and/or AE instruction;Therapeutic activities;Visual/perceptual remediation/compensation;Patient/family education;Balance training    OT Goals(Current goals can be found in the care plan section) Acute Rehab OT Goals Patient Stated Goal: Go home OT Goal Formulation: With patient Time For Goal Achievement: 12/13/21 Potential to Achieve Goals: Good  OT Frequency: Min 2X/week    Co-evaluation              AM-PAC OT "6 Clicks" Daily Activity     Outcome Measure Help from another person eating meals?: None Help from another person taking care of personal grooming?: A  Little Help from another person toileting, which includes using toliet, bedpan, or urinal?: A Little Help from another person bathing (including washing, rinsing, drying)?: A Little Help from another person to put on and taking off regular upper body clothing?: A Little Help from another person to put on and taking off regular lower body clothing?: A Little 6 Click Score: 19   End of Session Equipment Utilized During Treatment: Gait belt;Rolling walker (2 wheels) Nurse Communication: Mobility status  Activity Tolerance: Patient tolerated treatment well Patient left: in bed;with call bell/phone within reach;with family/visitor present  OT  Visit Diagnosis: Unsteadiness on feet (R26.81);Low vision, both eyes (H54.2)                Time: 4098-1191 OT Time Calculation (min): 22 min Charges:  OT General Charges $OT Visit: 1 Visit OT Evaluation $OT Eval Low Complexity: 1 Low  Ladene Artist, OTR/L [facility] Acute Rehabilitation Office: [phone]   Drue Novel 11/29/2021, 10:51 AM

## 2021-11-29 NOTE — Consult Note (Addendum)
[facility] CONSULT NOTE   MRN : [id number]  Reason for Consult: left eye vision loss Referring Physician: Dr. Peterson Lombard  History of Present Illness: 81 y/o female who reported loss of vision in the left eye on 11/28/21 at 20:15.  She reports recent cataract surgery and through it was due to that.  She was seen by Dr. Jillyn Hidden Optometrist the next morning, who had concerns for embolic event and directed her to go to the ED.  She denies history of CVA or TIA.  She denies weakness in extremities, aphasia or dysphasia.  Denies history of DVT or PE.   Past medical history includes: HLD, HTN, colitis and migraines.  History of tobacco use.  She denies history of Afib or coagulopathies.        Current Facility-Administered Medications  Medication Dose Route Frequency Provider Last Rate Last Admin   aspirin EC tablet 81 mg  81 mg Oral Daily Quincy Simmonds, MD   81 mg at 11/29/21 0948   atorvastatin (LIPITOR) tablet 40 mg  40 mg Oral Daily Quincy Simmonds, MD   40 mg at 11/29/21 0949   clopidogrel (PLAVIX) tablet 75 mg  75 mg Oral Daily Elmer Picker, NP   75 mg at 11/29/21 0950   FLUoxetine (PROZAC) capsule 10 mg  10 mg Oral Daily Quincy Simmonds, MD   10 mg at 11/29/21 0949   gabapentin (NEURONTIN) tablet 300 mg  300 mg Oral BID Quincy Simmonds, MD   300 mg at 11/29/21 0950   [START ON 11/30/2021] influenza vaccine adjuvanted (FLUAD) injection 0.5 mL  0.5 mL Intramuscular Tomorrow-1000 Debe Coder B, MD       letrozole [facility]) tablet 2.5 mg  2.5 mg Oral Daily Quincy Simmonds, MD   2.5 mg at 11/29/21 5956   Oral care mouth rinse  15 mL Mouth Rinse PRN Inez Catalina, MD       pantoprazole (PROTONIX) EC tablet 40 mg  40 mg Oral Daily Quincy Simmonds, MD   40 mg at 11/29/21 0950    Pt meds include: Statin :Yes Betablocker: No ASA: Yes since admission Other anticoagulants/antiplatelets: Plavix since admission  Past Medical History:  Diagnosis Date   GERD (gastroesophageal  reflux disease)    High cholesterol    Hypertension    Lymphocytic colitis    Migraine    "never anymore; it's been awhile since I've had one" (11/01/2015)   UTI (lower urinary tract infection) X 1    Past Surgical History:  Procedure Laterality Date   ERCP N/A 11/08/2015   Procedure: ENDOSCOPIC RETROGRADE CHOLANGIOPANCREATOGRAPHY (ERCP);  Surgeon: Vida Rigger, MD;  Location: [facility] ENDOSCOPY;  Service: Endoscopy;  Laterality: N/A;   ESOPHAGOGASTRODUODENOSCOPY N/A 11/02/2015   Procedure: ESOPHAGOGASTRODUODENOSCOPY (EGD);  Surgeon: Vida Rigger, MD;  Location: [facility] ENDOSCOPY;  Service: Endoscopy;  Laterality: N/A;   IR GENERIC HISTORICAL  11/03/2015   IR INT EXT BILIARY DRAIN WITH CHOLANGIOGRAM 11/03/2015 Richarda Overlie, MD [facility]INTER[facility] RAD   IR GENERIC HISTORICAL  11/17/2015   IR EXCHANGE BILIARY DRAIN 11/17/2015 Berdine Dance, MD [facility]   IR GENERIC HISTORICAL  11/16/2015   IR RADIOLOGIST EVAL & MGMT 11/16/2015 Gilmer Mor, DO [facility] INTERV RAD   SPYGLASS CHOLANGIOSCOPY N/A 11/08/2015   Procedure: LOVFIEPP CHOLANGIOSCOPY;  Surgeon: Vida Rigger, MD;  Location: [facility] ENDOSCOPY;  Service: Endoscopy;  Laterality: N/A;   TUBAL LIGATION  1970s    Social History Social History   Tobacco Use   Smoking status: Former  Packs/day: 1.00    Years: 30.00    Total pack years: 30.00    Types: Cigarettes   Smokeless tobacco: Never  Substance Use Topics   Alcohol use: No   Drug use: No    Family History History reviewed. No pertinent family history.  Allergies  Allergen Reactions   Tetanus Toxoids Swelling    WELTS     REVIEW OF SYSTEMS  General: [ ]  Weight loss, [ ]  Fever, [ ]  chills Neurologic: [ ]  Dizziness, [ ]  Blackouts, [ ]  Seizure [ ]  Stroke, [ ]  "Mini stroke", [ ]  Slurred speech, [ ]  Temporary blindness; [ ]  weakness in arms or legs, [ ]  Hoarseness [ ]  Dysphagia [x]  left eye vision loss Cardiac: [ ]  Chest pain/pressure, [ ]  Shortness of breath at rest [ ]  Shortness of breath with exertion, [  ] Atrial fibrillation or irregular heartbeat  Vascular: [ ]  Pain in legs with walking, [ ]  Pain in legs at rest, [ ]  Pain in legs at night,  [ ]  Non-healing ulcer, [ ]  Blood clot in vein/DVT,   Pulmonary: [ ]  Home oxygen, [ ]  Productive cough, [ ]  Coughing up blood, [ ]  Asthma,  [ ]  Wheezing [ ]  COPD Musculoskeletal:  [ ]  Arthritis, [ ]  Low back pain, [ ]  Joint pain Hematologic: [ ]  Easy Bruising, [ ]  Anemia; [ ]  Hepatitis Gastrointestinal: [ ]  Blood in stool, [ ]  Gastroesophageal Reflux/heartburn, Urinary: [ ]  chronic Kidney disease, [ ]  on HD - [ ]  MWF or [ ]  TTHS, [ ]  Burning with urination, [ ]  Difficulty urinating Skin: [ ]  Rashes, [ ]  Wounds Psychological: [ ]  Anxiety, [ ]  Depression  Physical Examination Vitals:   11/29/21 0030 11/29/21 0145 11/29/21 0329 11/29/21 0803  BP: (!) 173/93 (!) 187/90 (!) 153/86 (!) 143/84  Pulse: 63 63 (!) 58 67  Resp: 18 18 18 20   Temp:  98.2 F (36.8 C) (!) 97.5 F (36.4 C) (!) 97.2 F (36.2 C)  TempSrc:  Oral Oral Oral  SpO2: 92% 97% 96% 95%  Weight:      Height:       Body mass index is 35.67 kg/m.  General:  WDWN in NAD HENT: WNL Eyes:pinpoint vision per patient, loss of vision Pulmonary: normal non-labored breathing , without Rales, rhonchi,  wheezing Cardiac: RRR, without  Murmurs, rubs or gallops; No carotid bruits Abdomen: soft, NT, no masses Skin: no rashes, ulcers noted;  no Gangrene , no cellulitis; no open wounds;   Vascular Exam/Pulses:palpable radial and DP/PT pulses B   Musculoskeletal: no muscle wasting or atrophy; no edema  Neurologic: A&O X 3; Appropriate Affect ;  SENSATION: normal; MOTOR FUNCTION: 5/5 Symmetric Speech is fluent/normal   Significant Diagnostic Studies: CBC Lab Results  Component Value Date   WBC 5.1 11/28/2021   HGB 11.9 (L) 11/28/2021   HCT 37.0 11/28/2021   MCV 94.1 11/28/2021   PLT 265 11/28/2021    BMET    Component Value Date/Time   NA 139 11/28/2021 1334   K 4.0 11/28/2021  1334   CL 101 11/28/2021 1334   CO2 29 11/28/2021 1334   GLUCOSE 119 (H) 11/28/2021 1334   BUN 7 (L) 11/28/2021 1334   CREATININE 0.77 11/28/2021 1334   CALCIUM 9.5 11/28/2021 1334   GFRNONAA >60 11/28/2021 1334   GFRAA >60 05/30/2016 0803   Estimated Creatinine Clearance: 59 mL/min (by C-G formula based on SCr of 0.77 mg/dL).  COAG Lab Results  Component Value  Date   INR 1.1 11/28/2021   INR 1.10 11/03/2015     Non-Invasive Vascular Imaging:    Narrative & Impression  CLINICAL DATA:  Left-sided vision loss.   EXAM: MRI HEAD WITHOUT CONTRAST   TECHNIQUE: Multiplanar, multiecho pulse sequences of the brain and surrounding structures were obtained without intravenous contrast.   COMPARISON:  Same-day CT head   FINDINGS: Brain: There is no acute intracranial hemorrhage, extra-axial fluid collection, or acute infarct.   There is mild-to-moderate background parenchymal volume loss with prominence of the ventricular system and extra-axial CSF spaces. There is extensive confluent FLAIR signal abnormality throughout the supratentorial white matter which is nonspecific but likely reflects sequela of advanced chronic small vessel ischemic change.   There is no suspicious parenchymal signal abnormality. There is no mass lesion. There is no mass effect or midline shift.   Vascular: Normal flow voids.   Skull and upper cervical spine: Normal marrow signal.   Sinuses/Orbits: The paranasal sinuses are clear. Bilateral lens implants are in place. The globes and orbits are otherwise unremarkable.   Other: None.   IMPRESSION: 1. No acute intracranial pathology. 2. Extensive FLAIR signal abnormality in the supratentorial brain likely reflecting sequela of advanced chronic white matter microangiopathy.      EXAM: CT HEAD WITHOUT CONTRAST   TECHNIQUE: Contiguous axial images were obtained from the base of the skull through the vertex without intravenous contrast.    RADIATION DOSE REDUCTION: This exam was performed according to the departmental dose-optimization program which includes automated exposure control, adjustment of the mA and/or kV according to patient size and/or use of iterative reconstruction technique.   COMPARISON:  05/30/2016   FINDINGS: Brain: No evidence of intracranial hemorrhage, acute infarction, hydrocephalus, extra-axial collection, or mass lesion/mass effect. 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   Vascular:  No hyperdense vessel or other acute findings.   Skull: No evidence of fracture or other significant bone abnormality.   Sinuses/Orbits:  No acute findings.   Other: None.   IMPRESSION: No acute intracranial abnormality.   Stable severe chronic small vessel disease.     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   Left carotid system: Atherosclerosis involving the carotid bifurcation and proximal ICA with up to 40% stenosis of the proximal ICA.   Vertebral arteries: Patent bilaterally without greater than 50% Stenosis  Carotid duplex ordered and Pending  Sed rate and CRP elevated on labs. Pending further work up for embolic event verses arteritis.   Mosetta Pigeon 11/29/2021 11:00 AM  I agree with the above.  I have seen and evaluated the patient.  Briefly this is a 81 year old female who presented with vision loss in her left eye on 11/28/2021.  She was seen by ophthalmology who was concerned over central retinal artery occlusion and so she was sent to the hospital.  She had a CT scan that showed approximately 40% left carotid stenosis.  There was soft and calcified plaque at the carotid bifurcation.  We follow this up with a carotid duplex that showed 1-39% bilateral stenosis.  She is currently undergoing a cardiac embolic work-up.  Her sed  rate was elevated and so the possibility of temporal arteritis is in the differential.  I will discuss the case further with the stroke service, however with less than 50% stenosis I am not sure if carotid intervention is indicated  but we will have further discussions tomorrow.  She should be on dual antiplatelet therapy with aspirin and Plavix as well as high-dose statin therapy.  Durene Cal

## 2021-11-29 NOTE — Evaluation (Signed)
Physical Therapy Evaluation Patient Details Name: Amy Sheppard MRN: [id number] DOB: 02-15-1941 Today's Date: 11/29/2021  History of Present Illness  Pt is a 81 y.o female presenting to [facility] ED on 11/28/2021 from [facility] specialists due to L eye vision loss. MRI revealed no acute intracranial pathology. PMH significant for HTN, hypercholestrolemia, and breast cancer.   Clinical Impression  Pt with L eye vision loss affecting pt's balance with ambulation. Pt to benefit from RW for safe ambulation at this time as pt at increased fall risk without it. Recommend outpt PT to address impaired balance and progress towards indep function while learning to compensate for L eye vision loss. Acute PT to cont to follow.      Recommendations for follow up therapy are one component of a multi-disciplinary discharge planning process, led by the attending physician.  Recommendations may be updated based on patient status, additional functional criteria and insurance authorization.  Follow Up Recommendations Outpatient PT      Assistance Recommended at Discharge Frequent or constant Supervision/Assistance (initially)  Patient can return home with the following  Assist for transportation;Help with stairs or ramp for entrance;Assistance with cooking/housework    Equipment Recommendations Rolling walker (2 wheels)  Recommendations for Other Services       Functional Status Assessment Patient has had a recent decline in their functional status and demonstrates the ability to make significant improvements in function in a reasonable and predictable amount of time.     Precautions / Restrictions Precautions Precautions: Fall Precaution Comments: L eye vision loss Restrictions Weight Bearing Restrictions: No      Mobility  Bed Mobility Overal bed mobility: Modified Independent             General bed mobility comments: no physical assist, increased time, able to pull covers off self     Transfers Overall transfer level: Needs assistance Equipment used: Rolling walker (2 wheels) Transfers: Sit to/from Stand Sit to Stand: Min guard           General transfer comment: Min guard A for safety. Mild balance deficits observed    Ambulation/Gait Ambulation/Gait assistance: Min assist, Min guard Gait Distance (Feet): 120 Feet (x2) Assistive device: Rolling walker (2 wheels), None Gait Pattern/deviations: Step-through pattern, Decreased stride length, Staggering left, Staggering right, Wide base of support Gait velocity: increased with RW compared to without Gait velocity interpretation: 1.31 - 2.62 ft/sec, indicative of limited community ambulator   General Gait Details: pt first amb without AD, pt unsteady and staggering L/R reports "It like I was drinking last night.". Pt requiring HHA to steady self and pt was holding onto hallway rail. Pt then given RW to amb with. Pt more steady with RW than without AD. Pt with smooth, reciprocal gait pattern without LOB or staggering. Verbal cues for safe hand placement on the walker. pt reports "I feel more stable with the RW"  Stairs Stairs: Yes Stairs assistance: Min guard Stair Management: Two rails, Alternating pattern Number of Stairs: 2 General stair comments: definite use of railing  Wheelchair Mobility    Modified Rankin (Stroke Patients Only) Modified Rankin (Stroke Patients Only) Pre-Morbid Rankin Score: No significant disability Modified Rankin: Slight disability     Balance Overall balance assessment: Mild deficits observed, not formally tested  Pertinent Vitals/Pain Pain Assessment Pain Assessment: No/denies pain    Home Living Family/patient expects to be discharged to:: Private residence Living Arrangements: Alone Available Help at Discharge: Family;Available PRN/intermittently Type of Home:  [facility]) Home Access: Stairs to  enter Entrance Stairs-Rails: Can reach both Entrance Stairs-Number of Steps: 2   Home Layout: One level Home Equipment:  (shower stool) Additional Comments: walk in shower, shower stool.    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   Dominant Hand: Right    Extremity/Trunk Assessment   Upper Extremity Assessment Upper Extremity Assessment: Overall WFL for tasks assessed    Lower Extremity Assessment Lower Extremity Assessment: Overall WFL for tasks assessed    Cervical / Trunk Assessment Cervical / Trunk Assessment: Normal  Communication   Communication: No difficulties  Cognition Arousal/Alertness: Awake/alert Behavior During Therapy: WFL for tasks assessed/performed Overall Cognitive Status: Within Functional Limits for tasks assessed                                 General Comments: Pt appears to be near baseline with cognition. Pt aware of L eye vision loss, aware she was more unsteady during amb and reported feeling more steady with RW        General Comments General comments (skin integrity, edema, etc.): VSS    Exercises     Assessment/Plan    PT Assessment Patient needs continued PT services  PT Problem List Decreased balance;Decreased mobility;Decreased knowledge of use of DME       PT Treatment Interventions DME instruction;Gait training;Stair training;Functional mobility training;Therapeutic activities;Therapeutic exercise;Balance training;Neuromuscular re-education    PT Goals (Current goals can be found in the Care Plan section)  Acute Rehab PT Goals Patient Stated Goal: home PT Goal Formulation: With patient Time For Goal Achievement: 12/13/21 Potential to Achieve Goals: Good    Frequency Min 3X/week     Co-evaluation                AM-PAC PT "6 Clicks" Mobility  Outcome Measure Help needed turning from your back to your side while in a flat bed without using bedrails?: None Help needed moving from lying on your back to sitting on the side of a flat bed without using bedrails?: None Help needed moving to and from a bed to a chair (including a wheelchair)?: None Help needed standing up from a chair using your arms (e.g., wheelchair or bedside chair)?: None Help needed to walk in hospital room?: A Little Help needed climbing 3-5 steps with a railing? : A Little 6 Click Score: 22    End of Session Equipment Utilized During Treatment: Gait belt Activity Tolerance: Patient tolerated treatment well Patient left: in chair;with call bell/phone within reach;with chair alarm set Nurse Communication: Mobility status PT Visit Diagnosis: Unsteadiness on feet (R26.81)    Time: 0347-4259 PT Time Calculation (min) (ACUTE ONLY): 26 min   Charges:   PT Evaluation $PT Eval Low Complexity: 1 Low PT Treatments $Gait Training: 8-22 mins        Lewis Shock, PT, DPT Acute Rehabilitation Services Secure chat preferred Office #: [phone]   Iona Hansen 11/29/2021, 11:48 AM

## 2021-11-29 NOTE — Progress Notes (Signed)
STROKE TEAM PROGRESS NOTE   SUBJECTIVE (INTERVAL HISTORY) Her family is at the bedside.  Overall her condition is unchanged. She still has left vision loss, except a thin line in the middle to see through. No change since the onset.    OBJECTIVE Temp:  [97.2 F (36.2 C)-98.6 F (37 C)] 98.1 F (36.7 C) (09/06 1140) Pulse Rate:  [58-83] 75 (09/06 1421) Cardiac Rhythm: Normal sinus rhythm (09/06 0802) Resp:  [16-24] 22 (09/06 1140) BP: (143-196)/(77-104) 176/87 (09/06 1421) SpO2:  [91 %-97 %] 95 % (09/06 1140)  No results for input(s): "GLUCAP" in the last 168 hours. Recent Labs  Lab 11/28/21 1334  NA 139  K 4.0  CL 101  CO2 29  GLUCOSE 119*  BUN 7*  CREATININE 0.77  CALCIUM 9.5   Recent Labs  Lab 11/28/21 1334  AST 17  ALT 14  ALKPHOS 46  BILITOT 0.9  PROT 6.7  ALBUMIN 3.7   Recent Labs  Lab 11/28/21 1334  WBC 5.1  NEUTROABS 3.6  HGB 11.9*  HCT 37.0  MCV 94.1  PLT 265   No results for input(s): "CKTOTAL", "CKMB", "CKMBINDEX", "TROPONINI" in the last 168 hours. Recent Labs    11/28/21 1334  LABPROT 14.1  INR 1.1   Recent Labs    11/28/21 1515  COLORURINE STRAW*  LABSPEC 1.006  PHURINE 6.0  GLUCOSEU NEGATIVE  HGBUR MODERATE*  BILIRUBINUR NEGATIVE  KETONESUR NEGATIVE  PROTEINUR NEGATIVE  NITRITE NEGATIVE  LEUKOCYTESUR SMALL*       Component Value Date/Time   CHOL 177 11/28/2021 2000   TRIG 213 (H) 11/28/2021 2000   HDL 45 11/28/2021 2000   CHOLHDL 3.9 11/28/2021 2000   VLDL 43 (H) 11/28/2021 2000   LDLCALC 89 11/28/2021 2000   Lab Results  Component Value Date   HGBA1C 6.0 (H) 11/28/2021      Component Value Date/Time   LABOPIA NONE DETECTED 11/28/2021 1515   COCAINSCRNUR NONE DETECTED 11/28/2021 1515   LABBENZ NONE DETECTED 11/28/2021 1515   AMPHETMU NONE DETECTED 11/28/2021 1515   THCU NONE DETECTED 11/28/2021 1515   LABBARB NONE DETECTED 11/28/2021 1515    Recent Labs  Lab 11/28/21 1506  ETH <10    I have personally  reviewed the radiological images below and agree with the radiology interpretations.  VAS US CAROTID  Result Date: 11/29/2021 Carotid Arterial Duplex Study Patient Name:  Amy Sheppard  Date of Exam:   11/29/2021 Medical Rec #: [id number]       Accession #:    [id number] Date of Birth: 1940/07/28       Patient Gender: F Patient Age:   39 years Exam Location:  [facility] Procedure:      VAS US CAROTID Referring Phys: EMMA COLLINS --------------------------------------------------------------------------------  Indications:      Carotid stenosis. Risk Factors:     Hypertension. Comparison Study: 11/28/2021 - CT ANGIOGRAPHY HEAD AND NECK                   1. No emergent large vessel occlusion or proximal                   hemodynamically                   significant stenosis intracranially.                   2. In the neck, approximately 40% proximal left ICA stenosis. Performing Technologist: Oliver Hum RVT  Examination Guidelines: A complete evaluation includes B-mode imaging, spectral Doppler, color Doppler, and power Doppler as needed of all accessible portions of each vessel. Bilateral testing is considered an integral part of a complete examination. Limited examinations for reoccurring indications may be performed as noted.  Right Carotid Findings: +----------+--------+--------+--------+-----------------------+--------+           PSV cm/sEDV cm/sStenosisPlaque Description     Comments +----------+--------+--------+--------+-----------------------+--------+ CCA Prox  62      13              smooth and heterogenous         +----------+--------+--------+--------+-----------------------+--------+ CCA Distal53      12              smooth and heterogenous         +----------+--------+--------+--------+-----------------------+--------+ ICA Prox  70      12              smooth and heterogenoustortuous +----------+--------+--------+--------+-----------------------+--------+ ICA  Distal70      17                                     tortuous +----------+--------+--------+--------+-----------------------+--------+ ECA       80      7                                               +----------+--------+--------+--------+-----------------------+--------+ +----------+--------+-------+--------+-------------------+           PSV cm/sEDV cmsDescribeArm Pressure (mmHG) +----------+--------+-------+--------+-------------------+ [id number]                                        +----------+--------+-------+--------+-------------------+ +---------+--------+--+--------+--+---------+ VertebralPSV cm/s43EDV cm/s10Antegrade +---------+--------+--+--------+--+---------+  Left Carotid Findings: +----------+--------+--------+--------+-----------------------+--------+           PSV cm/sEDV cm/sStenosisPlaque Description     Comments +----------+--------+--------+--------+-----------------------+--------+ CCA Prox  73      14              smooth and heterogenous         +----------+--------+--------+--------+-----------------------+--------+ CCA Distal83      16              smooth and heterogenous         +----------+--------+--------+--------+-----------------------+--------+ ICA Prox  66      15              calcific               tortuous +----------+--------+--------+--------+-----------------------+--------+ ICA Distal72      19                                     tortuous +----------+--------+--------+--------+-----------------------+--------+ ECA       71      7                                               +----------+--------+--------+--------+-----------------------+--------+ +----------+--------+--------+--------+-------------------+           PSV cm/sEDV cm/sDescribeArm Pressure (mmHG) +----------+--------+--------+--------+-------------------+ [id number]                                          +----------+--------+--------+--------+-------------------+ +---------+--------+--+--------+-+---------+  VertebralPSV cm/s28EDV cm/s7Antegrade +---------+--------+--+--------+-+---------+   Summary: Right Carotid: Velocities in the right ICA are consistent with a 1-39% stenosis. Left Carotid: Velocities in the left ICA are consistent with a 1-39% stenosis. Vertebrals: Bilateral vertebral arteries demonstrate antegrade flow. *See table(s) above for measurements and observations.     Preliminary    ECHOCARDIOGRAM COMPLETE BUBBLE STUDY  Result Date: 11/29/2021    ECHOCARDIOGRAM REPORT   Patient Name:   Amy Sheppard Date of Exam: 11/29/2021 Medical Rec #:  [id number]      Height:       62.0 in Accession #:    [id number]     Weight:       195.0 lb Date of Birth:  1940-04-28      BSA:          1.891 m Patient Age:    68 years       BP:           143/84 mmHg Patient Gender: F              HR:           59 bpm. Exam Location:  Inpatient Procedure: 2D Echo, Cardiac Doppler, Color Doppler and Saline Contrast Bubble            Study Indications:    Stroke  History:        Patient has no prior history of Echocardiogram examinations.                 Risk Factors:Dyslipidemia and Hypertension.  Sonographer:    Jefferey Pica Referring Phys: Elmo  1. Left ventricular ejection fraction, by estimation, is 55 to 60%. The left ventricle has normal function. The left ventricle has no regional wall motion abnormalities. There is mild concentric left ventricular hypertrophy. Left ventricular diastolic parameters are consistent with Grade I diastolic dysfunction (impaired relaxation).  2. Right ventricular systolic function is normal. The right ventricular size is normal. There is normal pulmonary artery systolic pressure.  3. Left atrial size was mildly dilated.  4. Right atrial size was mildly dilated.  5. The mitral valve is normal in structure. No evidence of mitral valve regurgitation. No  evidence of mitral stenosis.  6. The aortic valve is normal in structure. Aortic valve regurgitation is not visualized. Aortic valve sclerosis is present, with no evidence of aortic valve stenosis.  7. There is mild dilatation of the ascending aorta, measuring 38 mm.  8. The inferior vena cava is normal in size with greater than 50% respiratory variability, suggesting right atrial pressure of 3 mmHg. Comparison(s): No prior Echocardiogram. FINDINGS  Left Ventricle: Left ventricular ejection fraction, by estimation, is 55 to 60%. The left ventricle has normal function. The left ventricle has no regional wall motion abnormalities. The left ventricular internal cavity size was normal in size. There is  mild concentric left ventricular hypertrophy. Left ventricular diastolic parameters are consistent with Grade I diastolic dysfunction (impaired relaxation). Right Ventricle: The right ventricular size is normal. No increase in right ventricular wall thickness. Right ventricular systolic function is normal. There is normal pulmonary artery systolic pressure. The tricuspid regurgitant velocity is 2.38 m/s, and  with an assumed right atrial pressure of 5 mmHg, the estimated right ventricular systolic pressure is 65.5 mmHg. Left Atrium: Left atrial size was mildly dilated. Right Atrium: Right atrial size was mildly dilated. Pericardium: There is no evidence of pericardial effusion. Presence of epicardial fat layer. Mitral Valve: The mitral valve is  normal in structure. Mild to moderate mitral annular calcification. No evidence of mitral valve regurgitation. No evidence of mitral valve stenosis. Tricuspid Valve: The tricuspid valve is normal in structure. Tricuspid valve regurgitation is not demonstrated. No evidence of tricuspid stenosis. Aortic Valve: The aortic valve is normal in structure. There is mild aortic valve annular calcification. Aortic valve regurgitation is not visualized. Aortic valve sclerosis is present,  with no evidence of aortic valve stenosis. Aortic valve peak gradient measures 11.8 mmHg. Pulmonic Valve: The pulmonic valve was normal in structure. Pulmonic valve regurgitation is not visualized. No evidence of pulmonic stenosis. Aorta: There is mild dilatation of the ascending aorta, measuring 38 mm. Venous: The inferior vena cava is normal in size with greater than 50% respiratory variability, suggesting right atrial pressure of 3 mmHg. IAS/Shunts: No atrial level shunt detected by color flow Doppler. Agitated saline contrast was given intravenously to evaluate for intracardiac shunting.  LEFT VENTRICLE PLAX 2D LVIDd:         3.90 cm   Diastology LVIDs:         1.50 cm   LV e' medial:    4.45 cm/s LV PW:         1.10 cm   LV E/e' medial:  12.7 LV IVS:        1.20 cm   LV e' lateral:   6.13 cm/s LVOT diam:     1.90 cm   LV E/e' lateral: 9.2 LV SV:         79 LV SV Index:   42 LVOT Area:     2.84 cm  RIGHT VENTRICLE             IVC RV Basal diam:  3.10 cm     IVC diam: 1.40 cm RV S prime:     13.80 cm/s TAPSE (M-mode): 2.4 cm LEFT ATRIUM             Index        RIGHT ATRIUM           Index LA diam:        3.50 cm 1.85 cm/m   RA Area:     20.90 cm LA Vol (A2C):   60.8 ml 32.15 ml/m  RA Volume:   70.40 ml  37.22 ml/m LA Vol (A4C):   69.9 ml 36.96 ml/m LA Biplane Vol: 72.4 ml 38.28 ml/m  AORTIC VALVE                 PULMONIC VALVE AV Area (Vmax): 2.13 cm     PV Vmax:       0.94 m/s AV Vmax:        171.50 cm/s  PV Peak grad:  3.5 mmHg AV Peak Grad:   11.8 mmHg LVOT Vmax:      129.00 cm/s LVOT Vmean:     79.100 cm/s LVOT VTI:       0.278 m  AORTA Ao Root diam: 3.20 cm Ao Asc diam:  3.80 cm MITRAL VALVE                TRICUSPID VALVE MV Area (PHT): 2.46 cm     TR Peak grad:   22.7 mmHg MV Decel Time: 309 msec     TR Vmax:        238.00 cm/s MV E velocity: 56.30 cm/s MV A velocity: 100.00 cm/s  SHUNTS MV E/A ratio:  0.56         Systemic VTI:  0.28 m                             Systemic Diam: 1.90 cm Godfrey Pick  Tobb DO Electronically signed by Berniece Salines DO Signature Date/Time: 11/29/2021/10:32:36 AM    Final    CT ANGIO HEAD NECK W WO CM  Result Date: 11/28/2021 CLINICAL DATA:  Stroke, follow up EXAM: CT ANGIOGRAPHY HEAD AND NECK TECHNIQUE: Multidetector CT imaging of the head and neck was performed using the standard protocol during bolus administration of intravenous contrast. Multiplanar CT image reconstructions and MIPs were obtained to evaluate the vascular anatomy. Carotid stenosis measurements (when applicable) are obtained utilizing NASCET criteria, using the distal internal carotid diameter as the denominator. RADIATION DOSE REDUCTION: This exam was performed according to the departmental dose-optimization program which includes automated exposure control, adjustment of the mA and/or kV according to patient size and/or use of iterative reconstruction technique. CONTRAST:  56mL OMNIPAQUE IOHEXOL 350 MG/ML SOLN COMPARISON:  CT head from the same day FINDINGS: CTA NECK FINDINGS Aortic arch: Great vessel a origins are patent. Right carotid system: Atherosclerosis without greater than 50% stenosis. Left carotid system: Atherosclerosis involving the carotid bifurcation and proximal ICA with up to 40% stenosis of the proximal ICA. Vertebral arteries: Patent bilaterally without greater than 50% stenosis. Skeleton: Multilevel degenerative change, greatest in severe at C6-C7. Other neck: No acute findings. Upper chest: Paraseptal emphysema.  Clear lung apices. Review of the MIP images confirms the above findings CTA HEAD FINDINGS Anterior circulation: Bilateral intracranial ICAs, MCAs, ACAs are patent without proximal hemodynamically significant stenosis. Posterior circulation: Bilateral intradural vertebral arteries, basilar artery and bilateral posterior cerebral arteries are patent without proximal hemodynamically significant stenosis. Mild atherosclerotic narrowing of the left intradural vertebral artery and mild  dolichoectasia of the basilar artery. Venous sinuses: As permitted by contrast timing, patent. Review of the MIP images confirms the above findings IMPRESSION: 1. No emergent large vessel occlusion or proximal hemodynamically significant stenosis intracranially. 2. In the neck, approximately 40% proximal left ICA stenosis. Electronically Signed   By: Margaretha Sheffield M.D.   On: 11/28/2021 18:13   MR Brain Wo Contrast (neuro protocol)  Result Date: 11/28/2021 CLINICAL DATA:  Left-sided vision loss. EXAM: MRI HEAD WITHOUT CONTRAST TECHNIQUE: Multiplanar, multiecho pulse sequences of the brain and surrounding structures were obtained without intravenous contrast. COMPARISON:  Same-day CT head FINDINGS: Brain: There is no acute intracranial hemorrhage, extra-axial fluid collection, or acute infarct. There is mild-to-moderate background parenchymal volume loss with prominence of the ventricular system and extra-axial CSF spaces. There is extensive confluent FLAIR signal abnormality throughout the supratentorial white matter which is nonspecific but likely reflects sequela of advanced chronic small vessel ischemic change. There is no suspicious parenchymal signal abnormality. There is no mass lesion. There is no mass effect or midline shift. Vascular: Normal flow voids. Skull and upper cervical spine: Normal marrow signal. Sinuses/Orbits: The paranasal sinuses are clear. Bilateral lens implants are in place. The globes and orbits are otherwise unremarkable. Other: None. IMPRESSION: 1. No acute intracranial pathology. 2. Extensive FLAIR signal abnormality in the supratentorial brain likely reflecting sequela of advanced chronic white matter microangiopathy. Electronically Signed   By: Valetta Mole M.D.   On: 11/28/2021 16:49   CT HEAD WO CONTRAST  Result Date: 11/28/2021 CLINICAL DATA:  Vision loss.  Monocular left visual change. EXAM: CT HEAD WITHOUT CONTRAST TECHNIQUE: Contiguous axial images were obtained from  the base of the skull  through the vertex without intravenous contrast. RADIATION DOSE REDUCTION: This exam was performed according to the departmental dose-optimization program which includes automated exposure control, adjustment of the mA and/or kV according to patient size and/or use of iterative reconstruction technique. COMPARISON:  05/30/2016 FINDINGS: Brain: No evidence of intracranial hemorrhage, acute infarction, hydrocephalus, extra-axial collection, or mass lesion/mass effect. 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 Vascular:  No hyperdense vessel or other acute findings. Skull: No evidence of fracture or other significant bone abnormality. Sinuses/Orbits:  No acute findings. Other: None. IMPRESSION: No acute intracranial abnormality. Stable severe chronic small vessel disease. Electronically Signed   By: Marlaine Hind M.D.   On: 11/28/2021 13:51     PHYSICAL EXAM  Temp:  [97.2 F (36.2 C)-98.6 F (37 C)] 98.1 F (36.7 C) (09/06 1140) Pulse Rate:  [58-83] 75 (09/06 1421) Resp:  [16-24] 22 (09/06 1140) BP: (143-196)/(77-104) 176/87 (09/06 1421) SpO2:  [91 %-97 %] 95 % (09/06 1140)  General - Well nourished, well developed, in no apparent distress.  Ophthalmologic - fundi not visualized due to noncooperation.  Cardiovascular - Regular rhythm and rate.  Mental Status -  Level of arousal and orientation to time, place, and person were intact. Language including expression, naming, repetition, comprehension was assessed and found intact. Fund of Knowledge was assessed and was intact.  Cranial Nerves II - XII - II - Visual field intact OD. Not able to see HW OS except a thin linear window at the lower middle visual field III, IV, VI - Extraocular movements intact. V - Facial sensation intact bilaterally. VII - Facial movement intact bilaterally. VIII - Hearing & vestibular intact bilaterally. X - Palate elevates symmetrically. XI - Chin turning & shoulder shrug intact  bilaterally. XII - Tongue protrusion intact.  Motor Strength - The patient's strength was normal in all extremities and pronator drift was absent.  Bulk was normal and fasciculations were absent.   Motor Tone - Muscle tone was assessed at the neck and appendages and was normal.  Reflexes - The patient's reflexes were symmetrical in all extremities and she had no pathological reflexes.  Sensory - Light touch, temperature/pinprick were assessed and were symmetrical.    Coordination - The patient had normal movements in the hands and feet with no ataxia or dysmetria.  Tremor was absent.  Gait and Station - deferred.   ASSESSMENT/PLAN [name] is a 81 y.o. female with history of HTN, HLD, colitis, migraine admitted for left vision loss. No tPA given due to outside window.    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 Left proximal ICA 40% stenosis but with soft and calcified irregular plaque.  CUS - unremarkable 2D Echo  EF 55-60% LDL 89 HgbA1c 6.1 ESR 48 and CRP 1.0, painless SCDs for VTE prophylaxis No antithrombotic prior to admission, now on aspirin 81 mg daily and clopidogrel 75 mg daily DAPT for 3 weeks and then ASA alone. Patient counseled to be compliant with her antithrombotic medications Ongoing aggressive stroke risk factor management Therapy recommendations:  outpt PT Disposition:  likely home  Carotid atherosclerosis CTA neck - left proximal ICA 40% stenosis with soft and calcified irregular plaque.  CUS unremarkable Pt left CRAO could be related to left ICA athero Given irregular shape of soft and calcified plaque, questioning for high risk  plaque, recommend vascular surgery consult  Hypertension Stable Long term BP goal normotensive  Hyperlipidemia Home meds:  zocor 20 LDL 89, goal < 70 Now on  lipitor 40 Continue statin at discharge  Other Stroke Risk Factors Advanced age Obesity, Body mass index is 35.67 kg/m.  Migraine   Other Active Problems Aortic atherosclerosis colitis  Hospital day # 0    Amy Hawking, MD PhD Stroke Neurology 11/29/2021 3:21 PM    To contact Stroke Continuity provider, please refer to [email or web address] After hours, contact General Neurology

## 2021-11-29 NOTE — Progress Notes (Signed)
Carotid artery duplex has been completed. Preliminary results can be found in CV Proc through chart review.   11/29/21 1:49 PM Olen Cordial RVT

## 2021-11-30 ENCOUNTER — Other Ambulatory Visit (HOSPITAL_COMMUNITY): Payer: Self-pay

## 2021-11-30 DIAGNOSIS — E7800 Pure hypercholesterolemia, unspecified: Secondary | ICD-10-CM | POA: Diagnosis not present

## 2021-11-30 DIAGNOSIS — H3412 Central retinal artery occlusion, left eye: Secondary | ICD-10-CM | POA: Diagnosis not present

## 2021-11-30 DIAGNOSIS — I6522 Occlusion and stenosis of left carotid artery: Secondary | ICD-10-CM | POA: Diagnosis not present

## 2021-11-30 DIAGNOSIS — I6521 Occlusion and stenosis of right carotid artery: Secondary | ICD-10-CM | POA: Diagnosis not present

## 2021-11-30 LAB — CBC
HCT: 36.5 % (ref 36.0–46.0)
Hemoglobin: 12 g/dL (ref 12.0–15.0)
MCH: 30.5 pg (ref 26.0–34.0)
MCHC: 32.9 g/dL (ref 30.0–36.0)
MCV: 92.9 fL (ref 80.0–100.0)
Platelets: 243 10*3/uL (ref 150–400)
RBC: 3.93 MIL/uL (ref 3.87–5.11)
RDW: 13 % (ref 11.5–15.5)
WBC: 6.1 10*3/uL (ref 4.0–10.5)
nRBC: 0 % (ref 0.0–0.2)

## 2021-11-30 LAB — BASIC METABOLIC PANEL
Anion gap: 11 (ref 5–15)
BUN: 10 mg/dL (ref 8–23)
CO2: 29 mmol/L (ref 22–32)
Calcium: 9.6 mg/dL (ref 8.9–10.3)
Chloride: 98 mmol/L (ref 98–111)
Creatinine, Ser: 0.74 mg/dL (ref 0.44–1.00)
GFR, Estimated: >60 mL/min (ref >60)
Glucose, Bld: 103 mg/dL — ABNORMAL HIGH (ref 70–99)
Potassium: 3.7 mmol/L (ref 3.5–5.1)
Sodium: 138 mmol/L (ref 135–145)

## 2021-11-30 MED ORDER — FLUOXETINE HCL 10 MG PO CAPS: 10.0000 mg | ORAL_CAPSULE | Freq: Every day | ORAL | 0 refills | Status: AC

## 2021-11-30 MED ORDER — ASPIRIN 81 MG PO TBEC
81.0000 mg | DELAYED_RELEASE_TABLET | Freq: Every day | ORAL | 0 refills | Status: AC
  Filled 2021-11-30: qty 30, 30d supply, fill #0

## 2021-11-30 MED ORDER — ATORVASTATIN CALCIUM 40 MG PO TABS
40.0000 mg | ORAL_TABLET | Freq: Every day | ORAL | 0 refills | Status: AC
Start: 2021-12-01 — End: 2021-12-31
  Filled 2021-11-30: qty 30, 30d supply, fill #0

## 2021-11-30 MED ORDER — CLOPIDOGREL BISULFATE 75 MG PO TABS
75.0000 mg | ORAL_TABLET | Freq: Every day | ORAL | 0 refills | Status: DC
Start: 2021-11-30 — End: 2021-12-25
  Filled 2021-11-30: qty 21, 21d supply, fill #0

## 2021-11-30 NOTE — Discharge Summary (Signed)
Name: Amy Sheppard MRN: [id number] DOB: 1940-06-12 81 y.o. PCP: Cathlean Sauer, MD  Date of Admission: 11/28/2021  1:01 PM Date of Discharge: 11/30/2021 Attending Physician: Sid Falcon, MD  Discharge Diagnosis: 1. Principal Problem:   Central retinal artery occlusion, left Active Problems:   CRAO (central retinal artery occlusion)  Discharge Medications: Allergies as of 11/30/2021       Reactions   Tetanus Toxoids Swelling   WELTS        Medication List     STOP taking these medications    simvastatin 20 MG tablet Commonly known as: ZOCOR       TAKE these medications    Aspirin Low Dose 81 MG tablet Generic drug: aspirin EC Take 1 tablet (81 mg total) by mouth daily. Swallow whole. Start taking on: December 01, 2021   atenolol 25 MG tablet Commonly known as: TENORMIN Take 25 mg by mouth daily.   atorvastatin 40 MG tablet Commonly known as: LIPITOR Take 1 tablet (40 mg total) by mouth daily. Start taking on: December 01, 2021   CALCIUM PO Take 1 tablet by mouth daily.   clopidogrel 75 MG tablet Commonly known as: PLAVIX Take 1 tablet (75 mg total) by mouth daily for 21 days.   FeroSul 325 (65 FE) MG tablet Generic drug: ferrous sulfate Take 1 tablet by mouth in the morning and at bedtime.   FLUoxetine 10 MG capsule Commonly known as: PROZAC Take 1 capsule (10 mg total) by mouth daily. Start taking on: December 01, 2021   gabapentin 300 MG capsule Commonly known as: NEURONTIN Take 300 mg by mouth 2 (two) times daily.   letrozole 2.5 MG tablet Commonly known as: FEMARA Take 2.5 mg by mouth daily.   lisinopril 40 MG tablet Commonly known as: ZESTRIL Take 1 tablet by mouth daily.   omeprazole 40 MG capsule Commonly known as: PRILOSEC Take 40 mg by mouth daily.   potassium chloride 10 MEQ tablet Commonly known as: KLOR-CON M Take 10 mEq by mouth daily.   VITAMIN D PO Take 1 tablet by mouth daily.   Vitamins/Minerals Tabs Take 1  tablet by mouth daily.               Durable Medical Equipment  (From admission, onward)           Start     Ordered   11/30/21 1537  For home use only DME Walker rolling  Once       Question Answer Comment  Walker: With [employer]   Patient needs a walker to treat with the following condition Vision loss of left eye      11/30/21 1543   11/29/21 1416  For home use only DME Walker rolling  Once       Question Answer Comment  Walker: With [place]   Patient needs a walker to treat with the following condition Weakness      11/29/21 1415            Disposition and follow-up:   Amy Sheppard was discharged from [facility] in Good condition.  At the hospital follow up visit please address:  1.    A. Central Retinal A. Occlusion  2.  Labs / imaging needed at time of follow-up: CBC  3.  Pending labs/ test needing follow-up: none  Follow-up Appointments: -F/u with vascular surgery Harold Barban MD on 12/06/21 for procedure.  -F/u with neurology stroke clinic at [facility] in approximately 4 weeks -F/u with primary care provider Cathlean Sauer MD within the next 1-2 weeks.  Follow-up Information     [facility] Follow up.   Why: The outpatient rehab will contact you for the first appointment Contact information: [address], [place], [place] [address]  Phone: [phone]        [facility]. Schedule an appointment as soon as possible for a visit in 1 month(s).   Specialty: Neurology Why: stroke clinic Contact information: [place] by problem list: 81 year old female with a PMHx of hypertension, hypercholesterolemia, and breast cancer (s/p lumpectomy and radiation, on letrozole) presented with acute onset unilateral vision loss  and admitted for admitted for stroke r/o.    Central retinal artery occlusion Stroke work up - no intracranial stroke found on imaging Patient presented the day after symptom onset. Developed blurry vision in her left eye initially progressed to complete darkness on 9/4. Patient visited her optometrist the following morning who recommended visiting an ophthalmologist that day.  She was later seen that day by ophthalmologist who was concerned for central retinal artery occlusion and recommended she go to the hospital.  Patient presented with complete vision loss of the left eye.  No other neurological deficits noted.  Temporal arteritis was in our initial differential despite lack of pain or tenderness on exam. ESR slightly elevated at 48 and CRP at 1 which was less concerning for temporal arteritis.  CT angio head/neck demonstrated approximately 40% proximal left ICA stenosis. Suspected this was likely involved in her vision loss. Carotid duplex demonstrated right ICA with a 1-39% stenosis and left ICA with 1-39% stenosis. Echo demonstrated EF 55-60%, mild LVH, grade 1 diastolic dysfunction. Less concerned for cardioembolic source given lack of emboli found on echo. Consulted Neuro who recommended dual antiplatelet therapy for 3 weeks then Asprin alone. Vascular surgery consulted and recommends DAPT w/ aspirin and plavix and high dose statin. Vascular planning for TCAR on 12/06/21. Discharged on  asprin 81 mg daily, Plavix 11m daily for 3 weeks, and atorvastatin 40 mg daily. Patient [facility] approximately 4 weeks.    Hypertension Pt with history of hypertension. Was hypertensive on arrival with last BP of of 213/152. Initially permissive hypertension to 220/120 per neurology. Initially held home Lisinopril 40 mg and atenolol 25 mg. BP 157/79 on day of discharge. Discharged on home Lisinopril 40 mg and atenolol 25 mg.    Hypercholesterolemia Hx of high cholesterol. Was on simvastatin 20 mg daily at home. Lipid panel reveals LDL 89, HDL 45, TGL elevated at 213, and VLDL elevated at 43. Discharged on atorvastatin 40 mg daily and instructed to stop simvastatin.    Hx of breast cancer  Pt diagnosed with breast cancer last year. Had a lumpectomy followed by radiation. Continued home letrozole 2.5 mg daily.    Disposition Ordered outpatient PT, outpatient OT, and rolling walker for home.   Discharge Exam:   BP (!) 157/79 (BP Location: Left Arm)   Pulse 79   Temp 98.4 F (36.9 C)   Resp 18   Ht _0  (1.575 m)   Wt 88.5 kg   SpO2 98%   BMI 35.67 kg/m  GENERAL:  No acute distress, resting comfortably in bed Cardiovascular:     Rate and Rhythm: Normal rate and regular rhythm.     Pulses: Normal pulses.     Heart sounds: Normal heart sounds.  Pulmonary:     Effort: Pulmonary effort is normal. No wheezes, rales, or rhonchi.  Musculoskeletal:        General: No swelling. Normal range of motion.  Skin:    General: Skin is warm and dry.  Neurological:     General: No focal deficit present.     Mental Status: Alert and oriented to person, place, and time.   Psychiatric:        Mood and Affect: Mood normal.  Pertinent Labs, Studies, and Procedures:     Latest Ref Rng & Units 11/30/2021    5:24 AM 11/29/2021    5:00 PM 11/28/2021    1:34 PM  CBC  WBC 4.0 - 10.5 K/uL 6.1  6.6  5.1   Hemoglobin 12.0 - 15.0 g/dL 12.0  12.5  11.9   Hematocrit 36.0 - 46.0 % 36.5  37.4  37.0   Platelets 150 - 400 K/uL 243  246  265        Latest Ref Rng & Units 11/30/2021    5:24 AM 11/29/2021    5:00 PM 11/28/2021    1:34 PM  BMP  Glucose 70 - 99 mg/dL 103  145  119   BUN 8 - 23 mg/dL _0 Creatinine 0.44 - 1.00 mg/dL 0.74  0.83  0.77   Sodium 135 - 145 mmol/L 138  136  139   Potassium 3.5 - 5.1 mmol/L 3.7  3.8  4.0   Chloride 98 - 111 mmol/L 98  95  101   CO2 22 - 32 mmol/L _1 Calcium 8.9 - 10.3  mg/dL 9.6  9.5  9.5     CT Head w/o contrast IMPRESSION: No acute intracranial abnormality.   Stable severe chronic small vessel disease.  MRI Brain w/o contrast IMPRESSION: 1. No acute intracranial pathology. 2. Extensive FLAIR signal abnormality in the supratentorial brain likely reflecting sequela of advanced chronic white matter microangiopathy.  CT Angio Head/Neck IMPRESSION: 1. No emergent large vessel occlusion or proximal hemodynamically significant stenosis intracranially. 2. In the neck, approximately 40% proximal left ICA stenosis.  Vas US Carotid Duplex Summary:  Right Carotid: Velocities in the right ICA are consistent with a 1-39%  stenosis.   Left Carotid: Velocities in the left ICA are consistent with a 1-39%  stenosis.   Vertebrals: Bilateral vertebral arteries demonstrate antegrade flow.  Echo w/ bubble study IMPRESSIONS    1. Left ventricular ejection fraction, by estimation, is 55 to 60%. The  left ventricle has normal function. The left ventricle has no regional  wall motion abnormalities. There is mild concentric left ventricular  hypertrophy. Left ventricular diastolic  parameters are consistent with Grade I diastolic dysfunction (impaired  relaxation).  2. Right ventricular systolic function is normal. The right ventricular  size is normal. There is normal pulmonary artery systolic pressure.  3. Left atrial size was mildly dilated.  4. Right atrial size was mildly dilated.  5. The mitral valve is normal in structure. No evidence of mitral valve  regurgitation. No evidence of mitral stenosis.  6. The aortic valve is normal in structure. Aortic valve regurgitation is  not visualized. Aortic valve sclerosis is present, with no evidence of  aortic valve stenosis.  7. There is mild dilatation of the ascending aorta, measuring 38 mm.  8. The inferior vena cava is normal in size with greater than 50%  respiratory variability, suggesting right  atrial pressure of 3 mmHg.   Discharge Instructions: Discharge Instructions     Ambulatory referral to Neurology   Complete by: As directed    Follow up with stroke clinic NP (Jessica Vanschaick or Cecille Rubin, if both not available, consider Zachery Dauer, or A[place]) at [facility] in about 4 weeks. Thanks.   Ambulatory referral to Occupational Therapy   Complete by: As directed    Ambulatory referral to Physical Therapy   Complete by: As directed    Call MD for:  difficulty breathing, headache or visual disturbances   Complete by: As directed    Call MD for:  persistant dizziness or light-headedness   Complete by: As directed    Call MD for:  severe uncontrolled pain   Complete by: As directed    Diet - low sodium heart healthy   Complete by: As directed    Increase activity slowly   Complete by: As directed       You were hospitalized for left eye vision loss.   Hospital Course: We got imaging of your neck that demonstrated some buildup of plaque in the vessels within your neck that may have resulted in your vision loss.  We got an ultrasound of your heart to make sure there was not a blood clot there (this was negative for a blood clot in your heart).  We treated your high blood pressure with medications.  We treated your coronary artery disease with medications.  We also put in orders for occupational therapy and physical therapy after you are discharged. You will go see them to get help with daily activities given your vision loss.  We also put in an order for a walker for you to use at home.   Medications:   Please start taking: -Aspirin EC 81 mg tablet (1 tablet by mouth daily) for your coronary artery disease -Atorvastatin (Lipitor) 40 mg tablet (once by mouth daily) for your coronary artery disease -Clopidogrel (Plavix) 75 mg tablet (once by mouth daily for 21 days) for your coronary artery disease -Fluoxetine (Prozac) 10 mg capsule (1 capsule by mouth daily) for anxiety    Please stop taking: -Simvastatin (Zocor) 20 mg tablet (once by mouth every evening) for your coronary artery disease   Please continue taking: -Atenolol (Tenormin) 25 mg tablet (once by mouth daily) for high blood pressure -Calcium PO (1 tablet by mouth daily) for your bone health -Ferosul 325 mg tablet (1 tablet by mouth in the morning and at bedtime) for anemia -Gabapentin (Neurontin) 300 mg capsule (by mouth 2 times daily) for nerve pain -Letrozole (Femara) 2.5 mg tablet (once by mouth daily) for history of breast cancer -Lisinopril (Zestril) 40 mg tablet (once by mouth daily) for high blood pressure -Omeprazole (Prilosec) 40 mg capsule (once by mouth daily) for acid reflux -Potassium chloride (K-Dur, Klor-Con) 10 mEq tablet (once by mouth daily) for low potassium -Vitamin D PO (1 tablet by mouth daily) for bone health -Vitamin/mineral tablets (1 tablet by mouth daily) for your overall health   Follow-Up: -Please follow-up with vascular surgery Harold Barban MD on 12/06/21 for your procedure.  -Please follow-up with neurology stroke clinic at [facility] in approximately 4 weeks (clinic will call you to schedule appointment)  -Please follow-up with your primary care provider Cathlean Sauer MD within  the next 1-2 weeks.  Signed: Starlyn Skeans, MD 11/30/2021, 4:59 PM   Pager: [phone]

## 2021-11-30 NOTE — Progress Notes (Addendum)
  Progress Note    11/30/2021 8:20 AM * No surgery found *  Subjective:  left eye vision loss stable.  Denies other stroke symptoms including one sided weakness or speech changes   Vitals:   11/30/21 0006 11/30/21 0355  BP: (!) 161/81 (!) 168/86  Pulse: 68 72  Resp: 18 18  Temp: 98.1 F (36.7 C) 98.2 F (36.8 C)  SpO2: 96% 94%   Physical Exam: Lungs:  non labored Extremities:  moving all extremities well Neurologic: CN otherwise grossly intact  CBC    Component Value Date/Time   WBC 6.1 11/30/2021 0524   RBC 3.93 11/30/2021 0524   HGB 12.0 11/30/2021 0524   HCT 36.5 11/30/2021 0524   PLT 243 11/30/2021 0524   MCV 92.9 11/30/2021 0524   MCH 30.5 11/30/2021 0524   MCHC 32.9 11/30/2021 0524   RDW 13.0 11/30/2021 0524   LYMPHSABS 0.9 11/28/2021 1334   MONOABS 0.5 11/28/2021 1334   EOSABS 0.1 11/28/2021 1334   BASOSABS 0.1 11/28/2021 1334    BMET    Component Value Date/Time   NA 138 11/30/2021 0524   K 3.7 11/30/2021 0524   CL 98 11/30/2021 0524   CO2 29 11/30/2021 0524   GLUCOSE 103 (H) 11/30/2021 0524   BUN 10 11/30/2021 0524   CREATININE 0.74 11/30/2021 0524   CALCIUM 9.6 11/30/2021 0524   GFRNONAA >60 11/30/2021 0524   GFRAA >60 05/30/2016 0803    INR    Component Value Date/Time   INR 1.1 11/28/2021 1334    No intake or output data in the 24 hours ending 11/30/21 0820   Assessment/Plan:  81 y.o. female with left eye vision loss; L ICA 40% stenosis  Neuro exam unchanged overnight.  Vision loss left eye is stable L ICA 40% by CTA and 1-39% by duplex; echo negative for embolic source Pt now on asa, plavix, statin Dr. Myra Gianotti will discuss medical vs surgical management with the stroke team    Emilie Rutter, PA-C [facility] [phone] 11/30/2021 8:20 AM  I agree with the above.  I have seen and evaluated the patient.  I had a extensive conversation with Dr. Roda Shutters regarding this patient.  Despite the ultrasound and CT scan  showing minimal stenosis, there is clearly soft plaque within her heavily calcified left carotid artery that is likely the thromboembolic source for her central retinal artery occlusion.  Because of the morphology of the plaque, we both feel that treating this will minimize her risk for future events.  She does appear to be a candidate for TCAR.  I discussed the details of the operation with her and she wants to proceed.  She understands the risk of stroke during the procedure.  She will be scheduled for Wednesday of next week.  She can be discharged home from my perspective.  She must remain on aspirin, statin, and Plavix  Wells [place]

## 2021-11-30 NOTE — H&P (View-Only) (Signed)
  Progress Note    11/30/2021 8:20 AM * No surgery found *  Subjective:  left eye vision loss stable.  Denies other stroke symptoms including one sided weakness or speech changes   Vitals:   11/30/21 0006 11/30/21 0355  BP: (!) 161/81 (!) 168/86  Pulse: 68 72  Resp: 18 18  Temp: 98.1 F (36.7 C) 98.2 F (36.8 C)  SpO2: 96% 94%   Physical Exam: Lungs:  non labored Extremities:  moving all extremities well Neurologic: CN otherwise grossly intact  CBC    Component Value Date/Time   WBC 6.1 11/30/2021 0524   RBC 3.93 11/30/2021 0524   HGB 12.0 11/30/2021 0524   HCT 36.5 11/30/2021 0524   PLT 243 11/30/2021 0524   MCV 92.9 11/30/2021 0524   MCH 30.5 11/30/2021 0524   MCHC 32.9 11/30/2021 0524   RDW 13.0 11/30/2021 0524   LYMPHSABS 0.9 11/28/2021 1334   MONOABS 0.5 11/28/2021 1334   EOSABS 0.1 11/28/2021 1334   BASOSABS 0.1 11/28/2021 1334    BMET    Component Value Date/Time   NA 138 11/30/2021 0524   K 3.7 11/30/2021 0524   CL 98 11/30/2021 0524   CO2 29 11/30/2021 0524   GLUCOSE 103 (H) 11/30/2021 0524   BUN 10 11/30/2021 0524   CREATININE 0.74 11/30/2021 0524   CALCIUM 9.6 11/30/2021 0524   GFRNONAA >60 11/30/2021 0524   GFRAA >60 05/30/2016 0803    INR    Component Value Date/Time   INR 1.1 11/28/2021 1334    No intake or output data in the 24 hours ending 11/30/21 0820   Assessment/Plan:  81 y.o. female with left eye vision loss; L ICA 40% stenosis  Neuro exam unchanged overnight.  Vision loss left eye is stable L ICA 40% by CTA and 1-39% by duplex; echo negative for embolic source Pt now on asa, plavix, statin Dr. Nomar Broad will discuss medical vs surgical management with the stroke team    Matthew Eveland, PA-C [facility] [phone] 11/30/2021 8:20 AM  I agree with the above.  I have seen and evaluated the patient.  I had a extensive conversation with Dr. Xu regarding this patient.  Despite the ultrasound and CT scan  showing minimal stenosis, there is clearly soft plaque within her heavily calcified left carotid artery that is likely the thromboembolic source for her central retinal artery occlusion.  Because of the morphology of the plaque, we both feel that treating this will minimize her risk for future events.  She does appear to be a candidate for TCAR.  I discussed the details of the operation with her and she wants to proceed.  She understands the risk of stroke during the procedure.  She will be scheduled for Wednesday of next week.  She can be discharged home from my perspective.  She must remain on aspirin, statin, and Plavix  Wells Anzleigh [place]  

## 2021-11-30 NOTE — Plan of Care (Signed)
°  Problem: Education: °Goal: Knowledge of General Education information will improve °Description: Including pain rating scale, medication(s)/side effects and non-pharmacologic comfort measures °Outcome: Progressing °  °Problem: Health Behavior/Discharge Planning: °Goal: Ability to manage health-related needs will improve °Outcome: Progressing °  °Problem: Clinical Measurements: °Goal: Ability to maintain clinical measurements within normal limits will improve °Outcome: Progressing °Goal: Will remain free from infection °Outcome: Progressing °Goal: Diagnostic test results will improve °Outcome: Progressing °Goal: Respiratory complications will improve °Outcome: Progressing °Goal: Cardiovascular complication will be avoided °Outcome: Progressing °  °Problem: Activity: °Goal: Risk for activity intolerance will decrease °Outcome: Progressing °  °Problem: Nutrition: °Goal: Adequate nutrition will be maintained °Outcome: Progressing °  °Problem: Coping: °Goal: Level of anxiety will decrease °Outcome: Progressing °  °Problem: Elimination: °Goal: Will not experience complications related to bowel motility °Outcome: Progressing °Goal: Will not experience complications related to urinary retention °Outcome: Progressing °  °Problem: Pain Managment: °Goal: General experience of comfort will improve °Outcome: Progressing °  °Problem: Safety: °Goal: Ability to remain free from injury will improve °Outcome: Progressing °  °Problem: Skin Integrity: °Goal: Risk for impaired skin integrity will decrease °Outcome: Progressing °  °Problem: Education: °Goal: Knowledge of disease or condition will improve °Outcome: Progressing °Goal: Knowledge of secondary prevention will improve (SELECT ALL) °Outcome: Progressing °Goal: Knowledge of patient specific risk factors will improve (INDIVIDUALIZE FOR PATIENT) °Outcome: Progressing °Goal: Individualized Educational Video(s) °Outcome: Progressing °  °Problem: Coping: °Goal: Will verbalize  positive feelings about self °Outcome: Progressing °  °Problem: Self-Care: °Goal: Ability to participate in self-care as condition permits will improve °Outcome: Progressing °  °

## 2021-11-30 NOTE — Progress Notes (Signed)
Physical Therapy Treatment Patient Details Name: Amy Sheppard MRN: [id number] DOB: 13-Mar-1941 Today's Date: 11/30/2021   History of Present Illness Pt is a 81 y.o female presenting to [facility] ED on 11/28/2021 from retina specialists due to L eye vision loss. MRI revealed no acute intracranial pathology. PMH significant for HTN, hypercholestrolemia, and breast cancer.    PT Comments    Patient progressing and noticing her balance slightly better than when initially up after bedrest.  She was cautioned to continue to use RW and follow safety precautions for fall prevention while continuing to rehab from current issues.  She had some difficulty with teeth brushing due to depth perception deficits.  She will continue to benefit from skilled PT in the acute setting and will need follow up outpatient rehab at d/c.    Recommendations for follow up therapy are one component of a multi-disciplinary discharge planning process, led by the attending physician.  Recommendations may be updated based on patient status, additional functional criteria and insurance authorization.  Follow Up Recommendations  Outpatient PT     Assistance Recommended at Discharge    Patient can return home with the following Assist for transportation;Help with stairs or ramp for entrance;Assistance with cooking/housework   Equipment Recommendations  Rolling walker (2 wheels)    Recommendations for Other Services       Precautions / Restrictions Precautions Precautions: Fall Precaution Comments: L eye vision loss     Mobility  Bed Mobility Overal bed mobility: Modified Independent                  Transfers Overall transfer level: Needs assistance Equipment used: Rolling walker (2 wheels) Transfers: Sit to/from Stand Sit to Stand: Supervision           General transfer comment: assist for safety, cue for hand placement    Ambulation/Gait Ambulation/Gait assistance: Min guard Gait Distance (Feet): 300  Feet Assistive device: Rolling walker (2 wheels) Gait Pattern/deviations: Step-through pattern, Decreased stride length       General Gait Details: using walker in hallway, and trying to go some without RW in room for toileting and handwashing with close S, touching surfaces   Stairs Stairs: Yes Stairs assistance: Min guard Stair Management: No rails, Alternating pattern, Forwards Number of Stairs: 2 General stair comments: assist for reports has railing on one step to enter home   Wheelchair Mobility    Modified Rankin (Stroke Patients Only) Modified Rankin (Stroke Patients Only) Pre-Morbid Rankin Score: No significant disability Modified Rankin: Moderately severe disability     Balance Overall balance assessment: Needs assistance   Sitting balance-Leahy Scale: Good       Standing balance-Leahy Scale: Fair Standing balance comment: washing hands in sink no UE support, some stepping in room no UE support               High Level Balance Comments: balance with single UE support for alternate step taps to first step x 10; balance feet apart then feet together eyes closed 10 sec with sway and S, no UE support            Cognition Arousal/Alertness: Awake/alert Behavior During Therapy: WFL for tasks assessed/performed Overall Cognitive Status: Within Functional Limits for tasks assessed                                          Exercises  General Comments General comments (skin integrity, edema, etc.): Discussed likely working on balance at outpatient PT with focus on vestibular sense for balance.  Discussed importance of lighting, footwear, non-slip surface in shower, using RW initially for fall prevention at home.      Pertinent Vitals/Pain Pain Assessment Pain Assessment: No/denies pain    Home Living                          Prior Function            PT Goals (current goals can now be found in the care plan  section) Progress towards PT goals: Progressing toward goals    Frequency    Min 4X/week      PT Plan Current plan remains appropriate    Co-evaluation              AM-PAC PT "6 Clicks" Mobility   Outcome Measure  Help needed turning from your back to your side while in a flat bed without using bedrails?: None Help needed moving from lying on your back to sitting on the side of a flat bed without using bedrails?: None Help needed moving to and from a bed to a chair (including a wheelchair)?: A Little Help needed standing up from a chair using your arms (e.g., wheelchair or bedside chair)?: A Little Help needed to walk in hospital room?: A Little Help needed climbing 3-5 steps with a railing? : A Little 6 Click Score: 20    End of Session Equipment Utilized During Treatment: Gait belt Activity Tolerance: Patient tolerated treatment well Patient left: in chair;with call bell/phone within reach;with chair alarm set   PT Visit Diagnosis: Other abnormalities of gait and mobility (R26.89);Other symptoms and signs involving the nervous system (W54.627)     Time: 0350-0938 PT Time Calculation (min) (ACUTE ONLY): 25 min  Charges:  $Gait Training: 8-22 mins $Neuromuscular Re-education: 8-22 mins                     Amy Sheppard, PT Acute Rehabilitation Services [phone] 11/30/2021    Amy Sheppard 11/30/2021, 2:47 PM

## 2021-11-30 NOTE — Progress Notes (Signed)
STROKE TEAM PROGRESS NOTE   SUBJECTIVE (INTERVAL HISTORY) Her family is at the bedside.  Her left vision loss has not changed since the onset. Discussed with Dr. Trula Slade, and we do feel the left ICA plaque is high risk and further revascularization would benefit for stroke prevention in the future. Left TCAR scheduled next week.    OBJECTIVE Temp:  [97.9 F (36.6 C)-98.7 F (37.1 C)] 97.9 F (36.6 C) (09/07 1130) Pulse Rate:  [68-88] 88 (09/07 1130) Cardiac Rhythm: Normal sinus rhythm (09/07 0839) Resp:  [18-20] 20 (09/07 1130) BP: (142-186)/(78-99) 142/78 (09/07 1130) SpO2:  [94 %-100 %] 100 % (09/07 1130)  No results for input(s): "GLUCAP" in the last 168 hours. Recent Labs  Lab 11/28/21 1334 11/29/21 1700 11/30/21 0524  NA 139 136 138  K 4.0 3.8 3.7  CL 101 95* 98  CO2 $Re'29 28 29  'pXA$ GLUCOSE 119* 145* 103*  BUN 7* 9 10  CREATININE 0.77 0.83 0.74  CALCIUM 9.5 9.5 9.6   Recent Labs  Lab 11/28/21 1334  AST 17  ALT 14  ALKPHOS 46  BILITOT 0.9  PROT 6.7  ALBUMIN 3.7   Recent Labs  Lab 11/28/21 1334 11/29/21 1700 11/30/21 0524  WBC 5.1 6.6 6.1  NEUTROABS 3.6  --   --   HGB 11.9* 12.5 12.0  HCT 37.0 37.4 36.5  MCV 94.1 92.1 92.9  PLT 265 246 243   No results for input(s): "CKTOTAL", "CKMB", "CKMBINDEX", "TROPONINI" in the last 168 hours. Recent Labs    11/28/21 1334  LABPROT 14.1  INR 1.1   Recent Labs    11/28/21 1515  COLORURINE STRAW*  LABSPEC 1.006  PHURINE 6.0  GLUCOSEU NEGATIVE  HGBUR MODERATE*  BILIRUBINUR NEGATIVE  KETONESUR NEGATIVE  PROTEINUR NEGATIVE  NITRITE NEGATIVE  LEUKOCYTESUR SMALL*       Component Value Date/Time   CHOL 177 11/28/2021 2000   TRIG 213 (H) 11/28/2021 2000   HDL 45 11/28/2021 2000   CHOLHDL 3.9 11/28/2021 2000   VLDL 43 (H) 11/28/2021 2000   LDLCALC 89 11/28/2021 2000   Lab Results  Component Value Date   HGBA1C 6.0 (H) 11/28/2021      Component Value Date/Time   LABOPIA NONE DETECTED 11/28/2021 1515    COCAINSCRNUR NONE DETECTED 11/28/2021 1515   LABBENZ NONE DETECTED 11/28/2021 1515   AMPHETMU NONE DETECTED 11/28/2021 1515   THCU NONE DETECTED 11/28/2021 1515   LABBARB NONE DETECTED 11/28/2021 1515    Recent Labs  Lab 11/28/21 1506  ETH <10    I have personally reviewed the radiological images below and agree with the radiology interpretations.  VAS US CAROTID  Result Date: 11/29/2021 Carotid Arterial Duplex Study Patient Name:  Amy Sheppard  Date of Exam:   11/29/2021 Medical Rec #: [id number]       Accession #:    [id number] Date of Birth: 80/14/1942       Patient Gender: F Patient Age:   81 years Exam Location:  [facility] Procedure:      VAS US CAROTID Referring Phys: EMMA COLLINS --------------------------------------------------------------------------------  Indications:      Carotid stenosis. Risk Factors:     Hypertension. Comparison Study: 11/28/2021 - CT ANGIOGRAPHY HEAD AND NECK                   1. No emergent large vessel occlusion or proximal                   hemodynamically  significant stenosis intracranially.                   2. In the neck, approximately 40% proximal left ICA stenosis. Performing Technologist: Oliver Hum RVT  Examination Guidelines: A complete evaluation includes B-mode imaging, spectral Doppler, color Doppler, and power Doppler as needed of all accessible portions of each vessel. Bilateral testing is considered an integral part of a complete examination. Limited examinations for reoccurring indications may be performed as noted.  Right Carotid Findings: +----------+--------+--------+--------+-----------------------+--------+           PSV cm/sEDV cm/sStenosisPlaque Description     Comments +----------+--------+--------+--------+-----------------------+--------+ CCA Prox  62      13              smooth and heterogenous         +----------+--------+--------+--------+-----------------------+--------+ CCA Distal53       12              smooth and heterogenous         +----------+--------+--------+--------+-----------------------+--------+ ICA Prox  70      12              smooth and heterogenoustortuous +----------+--------+--------+--------+-----------------------+--------+ ICA Distal70      17                                     tortuous +----------+--------+--------+--------+-----------------------+--------+ ECA       80      7                                               +----------+--------+--------+--------+-----------------------+--------+ +----------+--------+-------+--------+-------------------+           PSV cm/sEDV cmsDescribeArm Pressure (mmHG) +----------+--------+-------+--------+-------------------+ [id number]                                        +----------+--------+-------+--------+-------------------+ +---------+--------+--+--------+--+---------+ VertebralPSV cm/s43EDV cm/s10Antegrade +---------+--------+--+--------+--+---------+  Left Carotid Findings: +----------+--------+--------+--------+-----------------------+--------+           PSV cm/sEDV cm/sStenosisPlaque Description     Comments +----------+--------+--------+--------+-----------------------+--------+ CCA Prox  73      14              smooth and heterogenous         +----------+--------+--------+--------+-----------------------+--------+ CCA Distal83      16              smooth and heterogenous         +----------+--------+--------+--------+-----------------------+--------+ ICA Prox  66      15              calcific               tortuous +----------+--------+--------+--------+-----------------------+--------+ ICA Distal72      19                                     tortuous +----------+--------+--------+--------+-----------------------+--------+ ECA       71      7                                                +----------+--------+--------+--------+-----------------------+--------+ +----------+--------+--------+--------+-------------------+  PSV cm/sEDV cm/sDescribeArm Pressure (mmHG) +----------+--------+--------+--------+-------------------+ Subclavian109                                         +----------+--------+--------+--------+-------------------+ +---------+--------+--+--------+-+---------+ VertebralPSV cm/s28EDV cm/s7Antegrade +---------+--------+--+--------+-+---------+   Summary: Right Carotid: Velocities in the right ICA are consistent with a 1-39% stenosis. Left Carotid: Velocities in the left ICA are consistent with a 1-39% stenosis. Vertebrals: Bilateral vertebral arteries demonstrate antegrade flow. *See table(s) above for measurements and observations.  Electronically signed by Harold Barban MD on 11/29/2021 at 10:32:23 PM.    Final    ECHOCARDIOGRAM COMPLETE BUBBLE STUDY  Result Date: 11/29/2021    ECHOCARDIOGRAM REPORT   Patient Name:   Amy Sheppard Date of Exam: 11/29/2021 Medical Rec #:  [id number]      Height:       62.0 in Accession #:    [id number]     Weight:       195.0 lb Date of Birth:  September 06, 1940      BSA:          1.891 m Patient Age:    66 years       BP:           143/84 mmHg Patient Gender: F              HR:           59 bpm. Exam Location:  Inpatient Procedure: 2D Echo, Cardiac Doppler, Color Doppler and Saline Contrast Bubble            Study Indications:    Stroke  History:        Patient has no prior history of Echocardiogram examinations.                 Risk Factors:Dyslipidemia and Hypertension.  Sonographer:    Jefferey Pica Referring Phys: [place]  1. Left ventricular ejection fraction, by estimation, is 55 to 60%. The left ventricle has normal function. The left ventricle has no regional wall motion abnormalities. There is mild concentric left ventricular hypertrophy. Left ventricular diastolic parameters are consistent  with Grade I diastolic dysfunction (impaired relaxation).  2. Right ventricular systolic function is normal. The right ventricular size is normal. There is normal pulmonary artery systolic pressure.  3. Left atrial size was mildly dilated.  4. Right atrial size was mildly dilated.  5. The mitral valve is normal in structure. No evidence of mitral valve regurgitation. No evidence of mitral stenosis.  6. The aortic valve is normal in structure. Aortic valve regurgitation is not visualized. Aortic valve sclerosis is present, with no evidence of aortic valve stenosis.  7. There is mild dilatation of the ascending aorta, measuring 38 mm.  8. The inferior vena cava is normal in size with greater than 50% respiratory variability, suggesting right atrial pressure of 3 mmHg. Comparison(s): No prior Echocardiogram. FINDINGS  Left Ventricle: Left ventricular ejection fraction, by estimation, is 55 to 60%. The left ventricle has normal function. The left ventricle has no regional wall motion abnormalities. The left ventricular internal cavity size was normal in size. There is  mild concentric left ventricular hypertrophy. Left ventricular diastolic parameters are consistent with Grade I diastolic dysfunction (impaired relaxation). Right Ventricle: The right ventricular size is normal. No increase in right ventricular wall thickness. Right ventricular systolic function is normal. There is normal pulmonary artery systolic pressure. The tricuspid  regurgitant velocity is 2.38 m/s, and  with an assumed right atrial pressure of 5 mmHg, the estimated right ventricular systolic pressure is 50.3 mmHg. Left Atrium: Left atrial size was mildly dilated. Right Atrium: Right atrial size was mildly dilated. Pericardium: There is no evidence of pericardial effusion. Presence of epicardial fat layer. Mitral Valve: The mitral valve is normal in structure. Mild to moderate mitral annular calcification. No evidence of mitral valve regurgitation.  No evidence of mitral valve stenosis. Tricuspid Valve: The tricuspid valve is normal in structure. Tricuspid valve regurgitation is not demonstrated. No evidence of tricuspid stenosis. Aortic Valve: The aortic valve is normal in structure. There is mild aortic valve annular calcification. Aortic valve regurgitation is not visualized. Aortic valve sclerosis is present, with no evidence of aortic valve stenosis. Aortic valve peak gradient measures 11.8 mmHg. Pulmonic Valve: The pulmonic valve was normal in structure. Pulmonic valve regurgitation is not visualized. No evidence of pulmonic stenosis. Aorta: There is mild dilatation of the ascending aorta, measuring 38 mm. Venous: The inferior vena cava is normal in size with greater than 50% respiratory variability, suggesting right atrial pressure of 3 mmHg. IAS/Shunts: No atrial level shunt detected by color flow Doppler. Agitated saline contrast was given intravenously to evaluate for intracardiac shunting.  LEFT VENTRICLE PLAX 2D LVIDd:         3.90 cm   Diastology LVIDs:         1.50 cm   LV e' medial:    4.45 cm/s LV PW:         1.10 cm   LV E/e' medial:  12.7 LV IVS:        1.20 cm   LV e' lateral:   6.13 cm/s LVOT diam:     1.90 cm   LV E/e' lateral: 9.2 LV SV:         79 LV SV Index:   42 LVOT Area:     2.84 cm  RIGHT VENTRICLE             IVC RV Basal diam:  3.10 cm     IVC diam: 1.40 cm RV S prime:     13.80 cm/s TAPSE (M-mode): 2.4 cm LEFT ATRIUM             Index        RIGHT ATRIUM           Index LA diam:        3.50 cm 1.85 cm/m   RA Area:     20.90 cm LA Vol (A2C):   60.8 ml 32.15 ml/m  RA Volume:   70.40 ml  37.22 ml/m LA Vol (A4C):   69.9 ml 36.96 ml/m LA Biplane Vol: 72.4 ml 38.28 ml/m  AORTIC VALVE                 PULMONIC VALVE AV Area (Vmax): 2.13 cm     PV Vmax:       0.94 m/s AV Vmax:        171.50 cm/s  PV Peak grad:  3.5 mmHg AV Peak Grad:   11.8 mmHg LVOT Vmax:      129.00 cm/s LVOT Vmean:     79.100 cm/s LVOT VTI:       0.278 m  AORTA  Ao Root diam: 3.20 cm Ao Asc diam:  3.80 cm MITRAL VALVE                TRICUSPID VALVE MV Area (PHT):  2.46 cm     TR Peak grad:   22.7 mmHg MV Decel Time: 309 msec     TR Vmax:        238.00 cm/s MV E velocity: 56.30 cm/s MV A velocity: 100.00 cm/s  SHUNTS MV E/A ratio:  0.56         Systemic VTI:  0.28 m                             Systemic Diam: 1.90 cm Kardie Tobb DO Electronically signed by Berniece Salines DO Signature Date/Time: 11/29/2021/10:32:36 AM    Final    CT ANGIO HEAD NECK W WO CM  Result Date: 11/28/2021 CLINICAL DATA:  Stroke, follow up EXAM: CT ANGIOGRAPHY HEAD AND NECK TECHNIQUE: Multidetector CT imaging of the head and neck was performed using the standard protocol during bolus administration of intravenous contrast. Multiplanar CT image reconstructions and MIPs were obtained to evaluate the vascular anatomy. Carotid stenosis measurements (when applicable) are obtained utilizing NASCET criteria, using the distal internal carotid diameter as the denominator. RADIATION DOSE REDUCTION: This exam was performed according to the departmental dose-optimization program which includes automated exposure control, adjustment of the mA and/or kV according to patient size and/or use of iterative reconstruction technique. CONTRAST:  19mL OMNIPAQUE IOHEXOL 350 MG/ML SOLN COMPARISON:  CT head from the same day FINDINGS: CTA NECK FINDINGS Aortic arch: Great vessel a origins are patent. Right carotid system: Atherosclerosis without greater than 50% stenosis. Left carotid system: Atherosclerosis involving the carotid bifurcation and proximal ICA with up to 40% stenosis of the proximal ICA. Vertebral arteries: Patent bilaterally without greater than 50% stenosis. Skeleton: Multilevel degenerative change, greatest in severe at C6-C7. Other neck: No acute findings. Upper chest: Paraseptal emphysema.  Clear lung apices. Review of the MIP images confirms the above findings CTA HEAD FINDINGS Anterior circulation: Bilateral  intracranial ICAs, MCAs, ACAs are patent without proximal hemodynamically significant stenosis. Posterior circulation: Bilateral intradural vertebral arteries, basilar artery and bilateral posterior cerebral arteries are patent without proximal hemodynamically significant stenosis. Mild atherosclerotic narrowing of the left intradural vertebral artery and mild dolichoectasia of the basilar artery. Venous sinuses: As permitted by contrast timing, patent. Review of the MIP images confirms the above findings IMPRESSION: 1. No emergent large vessel occlusion or proximal hemodynamically significant stenosis intracranially. 2. In the neck, approximately 40% proximal left ICA stenosis. Electronically Signed   By: Margaretha Sheffield M.D.   On: 11/28/2021 18:13   MR Brain Wo Contrast (neuro protocol)  Result Date: 11/28/2021 CLINICAL DATA:  Left-sided vision loss. EXAM: MRI HEAD WITHOUT CONTRAST TECHNIQUE: Multiplanar, multiecho pulse sequences of the brain and surrounding structures were obtained without intravenous contrast. COMPARISON:  Same-day CT head FINDINGS: Brain: There is no acute intracranial hemorrhage, extra-axial fluid collection, or acute infarct. There is mild-to-moderate background parenchymal volume loss with prominence of the ventricular system and extra-axial CSF spaces. There is extensive confluent FLAIR signal abnormality throughout the supratentorial white matter which is nonspecific but likely reflects sequela of advanced chronic small vessel ischemic change. There is no suspicious parenchymal signal abnormality. There is no mass lesion. There is no mass effect or midline shift. Vascular: Normal flow voids. Skull and upper cervical spine: Normal marrow signal. Sinuses/Orbits: The paranasal sinuses are clear. Bilateral lens implants are in place. The globes and orbits are otherwise unremarkable. Other: None. IMPRESSION: 1. No acute intracranial pathology. 2. Extensive FLAIR signal abnormality in the  supratentorial brain  likely reflecting sequela of advanced chronic white matter microangiopathy. Electronically Signed   By: Valetta Mole M.D.   On: 11/28/2021 16:49   CT HEAD WO CONTRAST  Result Date: 11/28/2021 CLINICAL DATA:  Vision loss.  Monocular left visual change. EXAM: CT HEAD WITHOUT CONTRAST TECHNIQUE: Contiguous axial images were obtained from the base of the skull through the vertex without intravenous contrast. RADIATION DOSE REDUCTION: This exam was performed according to the departmental dose-optimization program which includes automated exposure control, adjustment of the mA and/or kV according to patient size and/or use of iterative reconstruction technique. COMPARISON:  05/30/2016 FINDINGS: Brain: No evidence of intracranial hemorrhage, acute infarction, hydrocephalus, extra-axial collection, or mass lesion/mass effect. 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 Vascular:  No hyperdense vessel or other acute findings. Skull: No evidence of fracture or other significant bone abnormality. Sinuses/Orbits:  No acute findings. Other: None. IMPRESSION: No acute intracranial abnormality. Stable severe chronic small vessel disease. Electronically Signed   By: Marlaine Hind M.D.   On: 11/28/2021 13:51     PHYSICAL EXAM  Temp:  [97.9 F (36.6 C)-98.7 F (37.1 C)] 97.9 F (36.6 C) (09/07 1130) Pulse Rate:  [68-88] 88 (09/07 1130) Resp:  [18-20] 20 (09/07 1130) BP: (142-186)/(78-99) 142/78 (09/07 1130) SpO2:  [94 %-100 %] 100 % (09/07 1130)  General - Well nourished, well developed, in no apparent distress.  Ophthalmologic - fundi not visualized due to noncooperation.  Cardiovascular - Regular rhythm and rate.  Mental Status -  Level of arousal and orientation to time, place, and person were intact. Language including expression, naming, repetition, comprehension was assessed and found intact. Fund of Knowledge was assessed and was intact.  Cranial Nerves II - XII - II -  Visual field intact OD. Not able to see HW OS except a thin linear window at the lower middle visual field III, IV, VI - Extraocular movements intact. V - Facial sensation intact bilaterally. VII - Facial movement intact bilaterally. VIII - Hearing & vestibular intact bilaterally. X - Palate elevates symmetrically. XI - Chin turning & shoulder shrug intact bilaterally. XII - Tongue protrusion intact.  Motor Strength - The patient's strength was normal in all extremities and pronator drift was absent.  Bulk was normal and fasciculations were absent.   Motor Tone - Muscle tone was assessed at the neck and appendages and was normal.  Reflexes - The patient's reflexes were symmetrical in all extremities and she had no pathological reflexes.  Sensory - Light touch, temperature/pinprick were assessed and were symmetrical.    Coordination - The patient had normal movements in the hands and feet with no ataxia or dysmetria.  Tremor was absent.  Gait and Station - deferred.   ASSESSMENT/PLAN Amy Sheppard is a 81 y.o. female with history of HTN, HLD, colitis, migraine admitted for left vision loss. No tPA given due to outside window.    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 Left proximal ICA 40% stenosis but with soft and calcified irregular plaque.  CUS - unremarkable 2D Echo  EF 55-60% LDL 89 HgbA1c 6.1 ESR 48 and CRP 1.0, painless SCDs for VTE prophylaxis No antithrombotic prior to admission, now on aspirin 81 mg daily and clopidogrel 75 mg daily DAPT. Further regimen per vascular surgery  Patient counseled to be compliant with her  antithrombotic medications Ongoing aggressive stroke risk factor management Therapy recommendations:  outpt PT Disposition:  likely home  Carotid atherosclerosis CTA neck - left proximal  ICA 40% stenosis with soft and calcified irregular plaque.  CUS unremarkable Pt left CRAO could be related to left ICA athero Given irregular shape of soft and calcified plaques and left CRAO, likely high risk plaque, I feel further revascularization would benefit pt for stroke prevention in the long run Discussed with Dr. Trula Slade, will schedule left TCAR next Wed.   Hypertension Stable Long term BP goal normotensive  Hyperlipidemia Home meds:  zocor 20 LDL 89, goal < 70 Now on lipitor 40 Continue statin at discharge  Other Stroke Risk Factors Advanced age Obesity, Body mass index is 35.67 kg/m.  Migraine   Other Active Problems Aortic atherosclerosis colitis  Hospital day # 1  Neurology will sign off. Please call with questions. Pt will follow up with stroke clinic NP at [facility] in about 4 weeks. Thanks for the consult.   Rosalin Hawking, MD PhD Stroke Neurology 11/30/2021 12:46 PM    To contact Stroke Continuity provider, please refer to [email or web address] After hours, contact General Neurology

## 2021-11-30 NOTE — Discharge Instructions (Addendum)
You were hospitalized for left eye vision loss.  Hospital Course: We got imaging of your neck that demonstrated some buildup of plaque in the vessels within your neck that may have resulted in your vision loss.  We got an ultrasound of your heart to make sure there was not a blood clot there (this was negative for a blood clot in your heart).  We treated your high blood pressure with medications.  We treated your coronary artery disease with medications.  We also put in orders for occupational therapy and physical therapy after you are discharged. You will go see them to get help with daily activities given your vision loss.  We also put in an order for a walker for you to use at home.  Medications:  Please start taking: -Aspirin EC 81 mg tablet (1 tablet by mouth daily) for your coronary artery disease -Atorvastatin (Lipitor) 40 mg tablet (once by mouth daily) for your coronary artery disease -Clopidogrel (Plavix) 75 mg tablet (once by mouth daily for 21 days) for your coronary artery disease -Fluoxetine (Prozac) 10 mg capsule (1 capsule by mouth daily) for anxiety  Please stop taking: -Simvastatin (Zocor) 20 mg tablet (once by mouth every evening) for your coronary artery disease  Please continue taking: -Atenolol (Tenormin) 25 mg tablet (once by mouth daily) for high blood pressure -Calcium PO (1 tablet by mouth daily) for your bone health -Ferosul 325 mg tablet (1 tablet by mouth in the morning and at bedtime) for anemia -Gabapentin (Neurontin) 300 mg capsule (by mouth 2 times daily) for nerve pain -Letrozole (Femara) 2.5 mg tablet (once by mouth daily) for history of breast cancer -Lisinopril (Zestril) 40 mg tablet (once by mouth daily) for high blood pressure -Omeprazole (Prilosec) 40 mg capsule (once by mouth daily) for acid reflux -Potassium chloride (K-Dur, Klor-Con) 10 mEq tablet (once by mouth daily) for low potassium -Vitamin D PO (1 tablet by mouth daily) for bone  health -Vitamin/mineral tablets (1 tablet by mouth daily) for your overall health  Follow-Up: -Please follow-up with vascular surgery Coral Else MD on 12/06/21 for your procedure.  -Please follow-up with neurology stroke clinic at [facility] in approximately 4 weeks (clinic will call you to schedule appointment)  -Please follow-up with your primary care provider Herma Carson MD within the next 1-2 weeks.

## 2021-12-04 ENCOUNTER — Other Ambulatory Visit: Payer: Self-pay

## 2021-12-04 DIAGNOSIS — I6522 Occlusion and stenosis of left carotid artery: Secondary | ICD-10-CM

## 2021-12-05 ENCOUNTER — Other Ambulatory Visit: Payer: Self-pay

## 2021-12-05 ENCOUNTER — Encounter (HOSPITAL_COMMUNITY): Payer: Self-pay | Admitting: Surgery

## 2021-12-05 NOTE — Anesthesia Preprocedure Evaluation (Addendum)
Anesthesia Evaluation  Patient identified by MRN, date of birth, ID band Patient awake    Reviewed: Allergy & Precautions, NPO status , Patient's Chart, lab work & pertinent test results, reviewed documented beta blocker date and time   Airway Mallampati: II  TM Distance: >3 FB Neck ROM: Full    Dental  (+) Dental Advisory Given, Upper Dentures   Pulmonary former smoker,    Pulmonary exam normal breath sounds clear to auscultation       Cardiovascular hypertension, Pt. on home beta blockers and Pt. on medications + Peripheral Vascular Disease (Left carotid artery stenosis)  Normal cardiovascular exam Rhythm:Regular Rate:Normal  Echo 11/29/21: 1. Left ventricular ejection fraction, by estimation, is 55 to 60%. The  left ventricle has normal function. The left ventricle has no regional  wall motion abnormalities. There is mild concentric left ventricular  hypertrophy. Left ventricular diastolic  parameters are consistent with Grade I diastolic dysfunction (impaired  relaxation).  2. Right ventricular systolic function is normal. The right ventricular  size is normal. There is normal pulmonary artery systolic pressure.  3. Left atrial size was mildly dilated.  4. Right atrial size was mildly dilated.  5. The mitral valve is normal in structure. No evidence of mitral valve  regurgitation. No evidence of mitral stenosis.  6. The aortic valve is normal in structure. Aortic valve regurgitation is  not visualized. Aortic valve sclerosis is present, with no evidence of  aortic valve stenosis.  7. There is mild dilatation of the ascending aorta, measuring 38 mm.  8. The inferior vena cava is normal in size with greater than 50%  respiratory variability, suggesting right atrial pressure of 3 mmHg.    Neuro/Psych  Headaches, CVA, Residual Symptoms negative psych ROS   GI/Hepatic Neg liver ROS, GERD  Medicated and Controlled,   Endo/Other  Obesity   Renal/GU negative Renal ROS     Musculoskeletal negative musculoskeletal ROS (+)   Abdominal   Peds  Hematology  (+) Blood dyscrasia (Plavix), ,   Anesthesia Other Findings   Reproductive/Obstetrics                          Anesthesia Physical Anesthesia Plan  ASA: 3  Anesthesia Plan: General   Post-op Pain Management: Tylenol PO (pre-op)*   Induction: Intravenous  PONV Risk Score and Plan: 3 and Dexamethasone, Ondansetron and Propofol infusion  Airway Management Planned: Oral ETT  Additional Equipment: Arterial line  Intra-op Plan:   Post-operative Plan: Extubation in OR  Informed Consent: I have reviewed the patients History and Physical, chart, labs and discussed the procedure including the risks, benefits and alternatives for the proposed anesthesia with the patient or authorized representative who has indicated his/her understanding and acceptance.     Dental advisory given  Plan Discussed with: CRNA  Anesthesia Plan Comments: (PAT note written 12/05/2021 by Shonna Chock, PA-C. )      Anesthesia Quick Evaluation

## 2021-12-05 NOTE — Progress Notes (Signed)
Anesthesia Chart Review:  Case: [id number] Date/Time: 12/06/21 0815   Procedure: Transcarotid Artery Revascularization (Left)   Anesthesia type: General   Pre-op diagnosis: Left carotid artery stenosis   [facility]OM [facility] / [facility]   Surgeons: Nada Libman, MD       DISCUSSION: Patient is a 81 year old female scheduled for the above procedure.  She was recently hospitalized at [facility] 11/28/21-11/30/21 for left eye loss of vision/left central retinal artery occlusion.  Neurologist Dr. Marvel Plan and vascular surgeon Dr. Myra Gianotti consulted. Work-up revealed only mild carotid stenosis/40% LICA; However, per Dr. Estanislado Spire notes, "there is clearly soft plaque within her heavily calcified left carotid artery that is likely the thromboembolic source for her central retinal artery occlusion.  Because of the morphology of the plaque, we [he and Dr. Roda Shutters both feel that treating this will minimize her risk for future events.  She does appear to be a candidate for TCAR." Procedure scheduled for 12/06/21. She is to continue ASA and Plavix.  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  Anesthesia team to evaluate on the day of surgery.    VS:  BP Readings from Last 3 Encounters:  11/30/21 (!) 157/79  05/30/16 131/74  11/17/15 (!) 110/99   Pulse Readings from Last 3 Encounters:  11/30/21 79  05/30/16 77  11/17/15 91     PROVIDERS: Herma Carson, MD is PCP    LABS: Most recent lab results include: Lab Results  Component Value Date   WBC 6.1 11/30/2021   HGB 12.0 11/30/2021   HCT 36.5 11/30/2021   PLT 243 11/30/2021   GLUCOSE 103 (H) 11/30/2021   CHOL 177 11/28/2021   TRIG 213 (H) 11/28/2021   HDL 45 11/28/2021   LDLCALC 89 11/28/2021   ALT 14 11/28/2021   AST 17 11/28/2021   NA 138 11/30/2021   K 3.7 11/30/2021   CL 98  11/30/2021   CREATININE 0.74 11/30/2021   BUN 10 11/30/2021   CO2 29 11/30/2021   INR 1.1 11/28/2021   HGBA1C 6.0 (H) 11/28/2021    IMAGES: CTA Head/Neck 11/28/21: IMPRESSION: 1. No emergent large vessel occlusion or proximal hemodynamically significant stenosis intracranially. 2. In the neck, approximately 40% proximal left ICA stenosis.  MRI Brain 11/28/21: IMPRESSION: 1. No acute intracranial pathology. 2. Extensive FLAIR signal abnormality in the supratentorial brain likely reflecting sequela of advanced chronic white matter microangiopathy.    EKG: 11/28/21: SB at 56 bpm   CV: Echo 11/29/21: IMPRESSIONS   1. Left ventricular ejection fraction, by estimation, is 55 to 60%. The  left ventricle has normal function. The left ventricle has no regional  wall motion abnormalities. There is mild concentric left ventricular  hypertrophy. Left ventricular diastolic  parameters are consistent with Grade I diastolic dysfunction (impaired  relaxation).   2. Right ventricular systolic function is normal. The right ventricular  size is normal. There is normal pulmonary artery systolic pressure.   3. Left atrial size was mildly dilated.   4. Right atrial size was mildly dilated.   5. The mitral valve is normal in structure. No evidence of mitral valve  regurgitation. No evidence of mitral stenosis.   6. The aortic valve is normal in structure. Aortic valve regurgitation is  not visualized. Aortic valve sclerosis is present, with no evidence of  aortic valve stenosis.   7. There is mild dilatation of  the ascending aorta, measuring 38 mm.   8. The inferior vena cava is normal in size with greater than 50%  respiratory variability, suggesting right atrial pressure of 3 mmHg.  - IAS/Shunts: No atrial level shunt detected by color flow Doppler. Agitated  saline contrast was given intravenously to evaluate for intracardiac  shunting.  - Comparison(s): No prior Echocardiogram.    US  Carotid 11/29/21: Summary:  - Right Carotid: Velocities in the right ICA are consistent with a 1-39%  stenosis.  - Left Carotid: Velocities in the left ICA are consistent with a 1-39%  stenosis.  - Vertebrals: Bilateral vertebral arteries demonstrate antegrade flow.   Past Medical History:  Diagnosis Date   GERD (gastroesophageal reflux disease)    High cholesterol    Hypertension    Lymphocytic colitis    Migraine    "never anymore; it's been awhile since I've had one" (11/01/2015)   UTI (lower urinary tract infection) X 1    Past Surgical History:  Procedure Laterality Date   ERCP N/A 11/08/2015   Procedure: ENDOSCOPIC RETROGRADE CHOLANGIOPANCREATOGRAPHY (ERCP);  Surgeon: Vida Rigger, MD;  Location: [facility] ENDOSCOPY;  Service: Endoscopy;  Laterality: N/A;   ESOPHAGOGASTRODUODENOSCOPY N/A 11/02/2015   Procedure: ESOPHAGOGASTRODUODENOSCOPY (EGD);  Surgeon: Vida Rigger, MD;  Location: [facility] ENDOSCOPY;  Service: Endoscopy;  Laterality: N/A;   IR GENERIC HISTORICAL  11/03/2015   IR INT EXT BILIARY DRAIN WITH CHOLANGIOGRAM 11/03/2015 Richarda Overlie, MD [facility]   IR GENERIC HISTORICAL  11/17/2015   IR EXCHANGE BILIARY DRAIN 11/17/2015 Berdine Dance, MD [facility]   IR GENERIC HISTORICAL  11/16/2015   IR RADIOLOGIST EVAL & MGMT 11/16/2015 Gilmer Mor, DO [facility] INTERV RAD   SPYGLASS CHOLANGIOSCOPY N/A 11/08/2015   Procedure: DXIPJASN CHOLANGIOSCOPY;  Surgeon: Vida Rigger, MD;  Location: [facility] ENDOSCOPY;  Service: Endoscopy;  Laterality: N/A;   TUBAL LIGATION  1970s    MEDICATIONS: No current facility-administered medications for this encounter.    aspirin EC 81 MG tablet   atenolol (TENORMIN) 25 MG tablet   atorvastatin (LIPITOR) 40 MG tablet   CALCIUM PO   clopidogrel (PLAVIX) 75 MG tablet   FEROSUL 325 (65 Fe) MG tablet   FLUoxetine (PROZAC) 10 MG capsule   gabapentin (NEURONTIN) 300 MG capsule   letrozole (FEMARA) 2.5 MG tablet   lisinopril (ZESTRIL) 40 MG tablet   omeprazole (PRILOSEC) 40 MG  capsule   potassium chloride (K-DUR,KLOR-CON) 10 MEQ tablet   VITAMIN D PO   Vitamins/Minerals TABS    Shonna Chock, PA-C Surgical Short Stay/Anesthesiology [facility] Phone [phone] [facility] Phone [phone] 12/05/2021 4:21 PM

## 2021-12-05 NOTE — Progress Notes (Signed)
Amy Sheppard denies pain our shortness of breath.  Patient denies having any s/s of Covid in her household, also denies any known exposure to Covid.   Amy Sheppard's PCP is Dr. Nehemiah Massed.  Patient will be followed by neurology.  Amy Sheppard does not have a cardiologist.  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  I asked anesthesia PA-C.

## 2021-12-05 NOTE — Progress Notes (Signed)
error 

## 2021-12-06 ENCOUNTER — Inpatient Hospital Stay (HOSPITAL_COMMUNITY): Payer: Medicare Other | Admitting: Vascular Surgery

## 2021-12-06 ENCOUNTER — Other Ambulatory Visit: Payer: Self-pay

## 2021-12-06 ENCOUNTER — Encounter (HOSPITAL_COMMUNITY)
Admission: RE | Disposition: A | Discharge status: Home / Self Care | Payer: Self-pay | Source: Home / Self Care | Attending: Surgery

## 2021-12-06 ENCOUNTER — Inpatient Hospital Stay (HOSPITAL_COMMUNITY)
Admission: RE | Admit: 2021-12-06 | Discharge: 2021-12-07 | DRG: 035 | Disposition: A | Discharge status: Home / Self Care | Payer: Medicare Other | Attending: Surgery | Admitting: Surgery

## 2021-12-06 ENCOUNTER — Encounter (HOSPITAL_COMMUNITY): Payer: Self-pay | Admitting: Surgery

## 2021-12-06 ENCOUNTER — Inpatient Hospital Stay (HOSPITAL_COMMUNITY): Payer: Medicare Other

## 2021-12-06 DIAGNOSIS — Z888 Allergy status to other drugs, medicaments and biological substances status: Secondary | ICD-10-CM | POA: Diagnosis not present

## 2021-12-06 DIAGNOSIS — Z923 Personal history of irradiation: Secondary | ICD-10-CM

## 2021-12-06 DIAGNOSIS — Z853 Personal history of malignant neoplasm of breast: Secondary | ICD-10-CM | POA: Diagnosis not present

## 2021-12-06 DIAGNOSIS — I6529 Occlusion and stenosis of unspecified carotid artery: Secondary | ICD-10-CM | POA: Diagnosis present

## 2021-12-06 DIAGNOSIS — I739 Peripheral vascular disease, unspecified: Secondary | ICD-10-CM | POA: Diagnosis not present

## 2021-12-06 DIAGNOSIS — Z7982 Long term (current) use of aspirin: Secondary | ICD-10-CM

## 2021-12-06 DIAGNOSIS — H5462 Unqualified visual loss, left eye, normal vision right eye: Secondary | ICD-10-CM | POA: Diagnosis present

## 2021-12-06 DIAGNOSIS — Z79811 Long term (current) use of aromatase inhibitors: Secondary | ICD-10-CM

## 2021-12-06 DIAGNOSIS — I6522 Occlusion and stenosis of left carotid artery: Secondary | ICD-10-CM

## 2021-12-06 DIAGNOSIS — E7800 Pure hypercholesterolemia, unspecified: Secondary | ICD-10-CM | POA: Diagnosis present

## 2021-12-06 DIAGNOSIS — Z87891 Personal history of nicotine dependence: Secondary | ICD-10-CM | POA: Diagnosis not present

## 2021-12-06 DIAGNOSIS — I10 Essential (primary) hypertension: Secondary | ICD-10-CM | POA: Diagnosis present

## 2021-12-06 DIAGNOSIS — H3412 Central retinal artery occlusion, left eye: Secondary | ICD-10-CM | POA: Diagnosis present

## 2021-12-06 DIAGNOSIS — K219 Gastro-esophageal reflux disease without esophagitis: Secondary | ICD-10-CM | POA: Diagnosis present

## 2021-12-06 DIAGNOSIS — Z79899 Other long term (current) drug therapy: Secondary | ICD-10-CM

## 2021-12-06 DIAGNOSIS — I63232 Cerebral infarction due to unspecified occlusion or stenosis of left carotid arteries: Secondary | ICD-10-CM

## 2021-12-06 DIAGNOSIS — Z7902 Long term (current) use of antithrombotics/antiplatelets: Secondary | ICD-10-CM | POA: Diagnosis not present

## 2021-12-06 HISTORY — PX: TRANSCAROTID ARTERY REVASCULARIZATIONÂ: SHX6778

## 2021-12-06 HISTORY — PX: ULTRASOUND GUIDANCE FOR VASCULAR ACCESS: SHX6516

## 2021-12-06 LAB — PROTIME-INR
INR: 1 (ref 0.8–1.2)
Prothrombin Time: 13.4 seconds (ref 11.4–15.2)

## 2021-12-06 LAB — URINALYSIS, ROUTINE W REFLEX MICROSCOPIC
Bilirubin Urine: NEGATIVE
Glucose, UA: NEGATIVE mg/dL
Hgb urine dipstick: NEGATIVE
Ketones, ur: NEGATIVE mg/dL
Leukocytes,Ua: NEGATIVE
Nitrite: NEGATIVE
Protein, ur: NEGATIVE mg/dL
Specific Gravity, Urine: 1.011 (ref 1.005–1.030)
pH: 6 (ref 5.0–8.0)

## 2021-12-06 LAB — TYPE AND SCREEN
ABO/RH(D): A POS
Antibody Screen: NEGATIVE

## 2021-12-06 LAB — CBC
HCT: 38.5 % (ref 36.0–46.0)
Hemoglobin: 12.4 g/dL (ref 12.0–15.0)
MCH: 30.4 pg (ref 26.0–34.0)
MCHC: 32.2 g/dL (ref 30.0–36.0)
MCV: 94.4 fL (ref 80.0–100.0)
Platelets: 246 10*3/uL (ref 150–400)
RBC: 4.08 MIL/uL (ref 3.87–5.11)
RDW: 12.7 % (ref 11.5–15.5)
WBC: 5.5 10*3/uL (ref 4.0–10.5)
nRBC: 0 % (ref 0.0–0.2)

## 2021-12-06 LAB — SURGICAL PCR SCREEN
MRSA, PCR: NEGATIVE
Staphylococcus aureus: NEGATIVE

## 2021-12-06 LAB — COMPREHENSIVE METABOLIC PANEL
ALT: 15 U/L (ref 0–44)
AST: 19 U/L (ref 15–41)
Albumin: 3.7 g/dL (ref 3.5–5.0)
Alkaline Phosphatase: 48 U/L (ref 38–126)
Anion gap: 8 (ref 5–15)
BUN: 8 mg/dL (ref 8–23)
CO2: 26 mmol/L (ref 22–32)
Calcium: 9.3 mg/dL (ref 8.9–10.3)
Chloride: 102 mmol/L (ref 98–111)
Creatinine, Ser: 0.82 mg/dL (ref 0.44–1.00)
GFR, Estimated: >60 mL/min (ref >60)
Glucose, Bld: 108 mg/dL — ABNORMAL HIGH (ref 70–99)
Potassium: 4.3 mmol/L (ref 3.5–5.1)
Sodium: 136 mmol/L (ref 135–145)
Total Bilirubin: 1 mg/dL (ref 0.3–1.2)
Total Protein: 6.9 g/dL (ref 6.5–8.1)

## 2021-12-06 LAB — ABO/RH: ABO/RH(D): A POS

## 2021-12-06 LAB — APTT: aPTT: 27 seconds (ref 24–36)

## 2021-12-06 LAB — POCT ACTIVATED CLOTTING TIME: Activated Clotting Time: 263 seconds

## 2021-12-06 SURGERY — TRANSCAROTID ARTERY REVASCULARIZATION (TCAR)
Anesthesia: General | Site: Neck | Laterality: Right

## 2021-12-06 MED ORDER — HEPARIN SODIUM (PORCINE) 5000 UNIT/ML IJ SOLN
5000.0000 [IU] | Freq: Three times a day (TID) | INTRAMUSCULAR | Status: DC
  Administered 2021-12-07: 5000 [IU] via SUBCUTANEOUS
  Filled 2021-12-06: qty 1

## 2021-12-06 MED ORDER — DOCUSATE SODIUM 100 MG PO CAPS
100.0000 mg | ORAL_CAPSULE | Freq: Every day | ORAL | Status: DC
  Administered 2021-12-07: 100 mg via ORAL
  Filled 2021-12-06: qty 1

## 2021-12-06 MED ORDER — PROPOFOL 10 MG/ML IV BOLUS
INTRAVENOUS | Status: DC | PRN
  Administered 2021-12-06 (×2): 50 mg via INTRAVENOUS

## 2021-12-06 MED ORDER — POTASSIUM CHLORIDE CRYS ER 20 MEQ PO TBCR: 20.0000 meq | EXTENDED_RELEASE_TABLET | Freq: Every day | ORAL | Status: DC | PRN

## 2021-12-06 MED ORDER — ALUM & MAG HYDROXIDE-SIMETH 200-200-20 MG/5ML PO SUSP: 15.0000 mL | ORAL | Status: DC | PRN

## 2021-12-06 MED ORDER — SODIUM CHLORIDE 0.9 % IV SOLN: INTRAVENOUS | Status: DC

## 2021-12-06 MED ORDER — FENTANYL CITRATE (PF) 100 MCG/2ML IJ SOLN: 25.0000 ug | INTRAMUSCULAR | Status: DC | PRN

## 2021-12-06 MED ORDER — LISINOPRIL 20 MG PO TABS
40.0000 mg | ORAL_TABLET | Freq: Every day | ORAL | Status: DC
  Administered 2021-12-07: 40 mg via ORAL
  Filled 2021-12-06: qty 2

## 2021-12-06 MED ORDER — LABETALOL HCL 5 MG/ML IV SOLN: 10.0000 mg | INTRAVENOUS | Status: DC | PRN

## 2021-12-06 MED ORDER — DEXAMETHASONE SODIUM PHOSPHATE 10 MG/ML IJ SOLN
INTRAMUSCULAR | Status: DC | PRN
  Administered 2021-12-06: 10 mg via INTRAVENOUS

## 2021-12-06 MED ORDER — CHLORHEXIDINE GLUCONATE CLOTH 2 % EX PADS: 6.0000 | MEDICATED_PAD | Freq: Once | CUTANEOUS | Status: DC

## 2021-12-06 MED ORDER — LIDOCAINE HCL (PF) 1 % IJ SOLN
INTRAMUSCULAR | Status: AC
  Filled 2021-12-06: qty 30

## 2021-12-06 MED ORDER — EPHEDRINE SULFATE-NACL 50-0.9 MG/10ML-% IV SOSY
PREFILLED_SYRINGE | INTRAVENOUS | Status: DC | PRN
  Administered 2021-12-06: 5 mg via INTRAVENOUS
  Administered 2021-12-06: 10 mg via INTRAVENOUS
  Administered 2021-12-06 (×2): 5 mg via INTRAVENOUS

## 2021-12-06 MED ORDER — GABAPENTIN 300 MG PO CAPS
300.0000 mg | ORAL_CAPSULE | Freq: Two times a day (BID) | ORAL | Status: DC
  Administered 2021-12-06 – 2021-12-07 (×2): 300 mg via ORAL
  Filled 2021-12-06 (×2): qty 1

## 2021-12-06 MED ORDER — SENNOSIDES-DOCUSATE SODIUM 8.6-50 MG PO TABS: 1.0000 | ORAL_TABLET | Freq: Every evening | ORAL | Status: DC | PRN

## 2021-12-06 MED ORDER — FENTANYL CITRATE (PF) 250 MCG/5ML IJ SOLN
INTRAMUSCULAR | Status: DC | PRN
  Administered 2021-12-06 (×2): 50 ug via INTRAVENOUS

## 2021-12-06 MED ORDER — METOPROLOL TARTRATE 5 MG/5ML IV SOLN: 2.0000 mg | INTRAVENOUS | Status: DC | PRN

## 2021-12-06 MED ORDER — ONDANSETRON HCL 4 MG/2ML IJ SOLN: 4.0000 mg | Freq: Once | INTRAMUSCULAR | Status: DC | PRN

## 2021-12-06 MED ORDER — IODIXANOL 320 MG/ML IV SOLN
INTRAVENOUS | Status: DC | PRN
  Administered 2021-12-06: 45 mL

## 2021-12-06 MED ORDER — MAGNESIUM SULFATE 2 GM/50ML IV SOLN: 2.0000 g | Freq: Every day | INTRAVENOUS | Status: DC | PRN

## 2021-12-06 MED ORDER — 0.9 % SODIUM CHLORIDE (POUR BTL) OPTIME
TOPICAL | Status: DC | PRN
  Administered 2021-12-06: 1000 mL

## 2021-12-06 MED ORDER — PANTOPRAZOLE SODIUM 40 MG PO TBEC
40.0000 mg | DELAYED_RELEASE_TABLET | Freq: Every day | ORAL | Status: DC
  Administered 2021-12-07: 40 mg via ORAL
  Filled 2021-12-06: qty 1

## 2021-12-06 MED ORDER — CEFAZOLIN SODIUM-DEXTROSE 2-4 GM/100ML-% IV SOLN
2.0000 g | INTRAVENOUS | Status: AC
  Administered 2021-12-06: 2 g via INTRAVENOUS
  Filled 2021-12-06: qty 100

## 2021-12-06 MED ORDER — HEPARIN SODIUM (PORCINE) 1000 UNIT/ML IJ SOLN
INTRAMUSCULAR | Status: DC | PRN
  Administered 2021-12-06: 9000 [IU] via INTRAVENOUS

## 2021-12-06 MED ORDER — ACETAMINOPHEN 500 MG PO TABS
1000.0000 mg | ORAL_TABLET | Freq: Once | ORAL | Status: AC
  Administered 2021-12-06: 1000 mg via ORAL
  Filled 2021-12-06: qty 2

## 2021-12-06 MED ORDER — HEPARIN 6000 UNIT IRRIGATION SOLUTION
Status: AC
  Filled 2021-12-06: qty 500

## 2021-12-06 MED ORDER — PROTAMINE SULFATE 10 MG/ML IV SOLN
INTRAVENOUS | Status: DC | PRN
  Administered 2021-12-06 (×5): 10 mg via INTRAVENOUS

## 2021-12-06 MED ORDER — ONDANSETRON HCL 4 MG/2ML IJ SOLN: 4.0000 mg | Freq: Four times a day (QID) | INTRAMUSCULAR | Status: DC | PRN

## 2021-12-06 MED ORDER — HEPARIN 6000 UNIT IRRIGATION SOLUTION
Status: DC | PRN
  Administered 2021-12-06: 1

## 2021-12-06 MED ORDER — LACTATED RINGERS IV SOLN: INTRAVENOUS | Status: DC | PRN

## 2021-12-06 MED ORDER — ONDANSETRON HCL 4 MG/2ML IJ SOLN
INTRAMUSCULAR | Status: DC | PRN
  Administered 2021-12-06: 4 mg via INTRAVENOUS

## 2021-12-06 MED ORDER — CHLORHEXIDINE GLUCONATE 0.12 % MT SOLN
OROMUCOSAL | Status: AC
  Administered 2021-12-06: 15 mL
  Filled 2021-12-06: qty 15

## 2021-12-06 MED ORDER — ATORVASTATIN CALCIUM 40 MG PO TABS
40.0000 mg | ORAL_TABLET | Freq: Every day | ORAL | Status: DC
  Administered 2021-12-06 – 2021-12-07 (×2): 40 mg via ORAL
  Filled 2021-12-06 (×2): qty 1

## 2021-12-06 MED ORDER — GUAIFENESIN-DM 100-10 MG/5ML PO SYRP: 15.0000 mL | ORAL_SOLUTION | ORAL | Status: DC | PRN

## 2021-12-06 MED ORDER — ACETAMINOPHEN 650 MG RE SUPP: 325.0000 mg | RECTAL | Status: DC | PRN

## 2021-12-06 MED ORDER — SUGAMMADEX SODIUM 200 MG/2ML IV SOLN
INTRAVENOUS | Status: DC | PRN
  Administered 2021-12-06: 200 mg via INTRAVENOUS

## 2021-12-06 MED ORDER — PHENYLEPHRINE HCL-NACL 20-0.9 MG/250ML-% IV SOLN
INTRAVENOUS | Status: DC | PRN
  Administered 2021-12-06: 50 ug/min via INTRAVENOUS

## 2021-12-06 MED ORDER — ACETAMINOPHEN 325 MG PO TABS: 325.0000 mg | ORAL_TABLET | ORAL | Status: DC | PRN

## 2021-12-06 MED ORDER — FLUOXETINE HCL 10 MG PO CAPS
10.0000 mg | ORAL_CAPSULE | Freq: Every day | ORAL | Status: DC
  Administered 2021-12-06 – 2021-12-07 (×2): 10 mg via ORAL
  Filled 2021-12-06 (×2): qty 1

## 2021-12-06 MED ORDER — MIDAZOLAM HCL 2 MG/2ML IJ SOLN
INTRAMUSCULAR | Status: AC
  Filled 2021-12-06: qty 2

## 2021-12-06 MED ORDER — CLOPIDOGREL BISULFATE 75 MG PO TABS
75.0000 mg | ORAL_TABLET | Freq: Every day | ORAL | Status: DC
  Administered 2021-12-07: 75 mg via ORAL
  Filled 2021-12-06: qty 1

## 2021-12-06 MED ORDER — HYDRALAZINE HCL 20 MG/ML IJ SOLN: 5.0000 mg | INTRAMUSCULAR | Status: DC | PRN

## 2021-12-06 MED ORDER — ATENOLOL 25 MG PO TABS
25.0000 mg | ORAL_TABLET | Freq: Every day | ORAL | Status: DC
  Administered 2021-12-07: 25 mg via ORAL
  Filled 2021-12-06: qty 1

## 2021-12-06 MED ORDER — CEFAZOLIN SODIUM-DEXTROSE 2-4 GM/100ML-% IV SOLN
2.0000 g | Freq: Three times a day (TID) | INTRAVENOUS | Status: AC
  Administered 2021-12-06 (×2): 2 g via INTRAVENOUS
  Filled 2021-12-06 (×2): qty 100

## 2021-12-06 MED ORDER — POTASSIUM CHLORIDE CRYS ER 10 MEQ PO TBCR
10.0000 meq | EXTENDED_RELEASE_TABLET | Freq: Every day | ORAL | Status: DC
  Administered 2021-12-07: 10 meq via ORAL
  Filled 2021-12-06: qty 1

## 2021-12-06 MED ORDER — ASPIRIN 81 MG PO TBEC
81.0000 mg | DELAYED_RELEASE_TABLET | Freq: Every day | ORAL | Status: DC
  Administered 2021-12-07: 81 mg via ORAL
  Filled 2021-12-06: qty 1

## 2021-12-06 MED ORDER — LETROZOLE 2.5 MG PO TABS
2.5000 mg | ORAL_TABLET | Freq: Every day | ORAL | Status: DC
  Administered 2021-12-06 – 2021-12-07 (×2): 2.5 mg via ORAL
  Filled 2021-12-06 (×2): qty 1

## 2021-12-06 MED ORDER — SODIUM CHLORIDE 0.9 % IV SOLN: 500.0000 mL | Freq: Once | INTRAVENOUS | Status: DC | PRN

## 2021-12-06 MED ORDER — OXYCODONE HCL 5 MG PO TABS: 5.0000 mg | ORAL_TABLET | ORAL | Status: DC | PRN

## 2021-12-06 MED ORDER — ROCURONIUM BROMIDE 10 MG/ML (PF) SYRINGE
PREFILLED_SYRINGE | INTRAVENOUS | Status: DC | PRN
  Administered 2021-12-06: 50 mg via INTRAVENOUS
  Administered 2021-12-06: 30 mg via INTRAVENOUS
  Administered 2021-12-06: 20 mg via INTRAVENOUS

## 2021-12-06 MED ORDER — GLYCOPYRROLATE 0.2 MG/ML IJ SOLN
INTRAMUSCULAR | Status: DC | PRN
  Administered 2021-12-06 (×2): .1 mg via INTRAVENOUS

## 2021-12-06 MED ORDER — PROPOFOL 10 MG/ML IV BOLUS
INTRAVENOUS | Status: AC
  Filled 2021-12-06: qty 20

## 2021-12-06 MED ORDER — HEMOSTATIC AGENTS (NO CHARGE) OPTIME
TOPICAL | Status: DC | PRN
  Administered 2021-12-06: 1 via TOPICAL

## 2021-12-06 MED ORDER — LIDOCAINE 2% (20 MG/ML) 5 ML SYRINGE
INTRAMUSCULAR | Status: DC | PRN
  Administered 2021-12-06: 80 mg via INTRAVENOUS

## 2021-12-06 MED ORDER — FENTANYL CITRATE (PF) 250 MCG/5ML IJ SOLN
INTRAMUSCULAR | Status: AC
  Filled 2021-12-06: qty 5

## 2021-12-06 MED ORDER — HYDROMORPHONE HCL 1 MG/ML IJ SOLN: 0.5000 mg | INTRAMUSCULAR | Status: DC | PRN

## 2021-12-06 MED ORDER — BISACODYL 5 MG PO TBEC: 5.0000 mg | DELAYED_RELEASE_TABLET | Freq: Every day | ORAL | Status: DC | PRN

## 2021-12-06 MED ORDER — PHENOL 1.4 % MT LIQD: 1.0000 | OROMUCOSAL | Status: DC | PRN

## 2021-12-06 SURGICAL SUPPLY — 50 items
BAG BANDED W/RUBBER/TAPE 36X54 (MISCELLANEOUS) ×2 IMPLANT
BAG COUNTER SPONGE SURGICOUNT (BAG) ×2 IMPLANT
BALLN STERLING RX 5X30X80 (BALLOONS) ×2
BALLOON STERLING RX 5X30X80 (BALLOONS) IMPLANT
CANISTER SUCT 3000ML PPV (MISCELLANEOUS) ×2 IMPLANT
CATH SUCT 10FR WHISTLE TIP (CATHETERS) ×2 IMPLANT
CLIP TI MEDIUM 6 (CLIP) IMPLANT
CLIP TI WIDE RED SMALL 6 (CLIP) IMPLANT
COVER DOME SNAP 22 D (MISCELLANEOUS) ×2 IMPLANT
COVER PROBE W GEL 5X96 (DRAPES) ×2 IMPLANT
DERMABOND ADVANCED .7 DNX12 (GAUZE/BANDAGES/DRESSINGS) ×2 IMPLANT
DRAPE FEMORAL ANGIO 80X135IN (DRAPES) ×2 IMPLANT
ELECT REM PT RETURN 9FT ADLT (ELECTROSURGICAL) ×2
ELECTRODE REM PT RTRN 9FT ADLT (ELECTROSURGICAL) ×2 IMPLANT
GLOVE SURG SS PI 7.5 STRL IVOR (GLOVE) ×6 IMPLANT
GOWN STRL REUS W/ TWL LRG LVL3 (GOWN DISPOSABLE) ×4 IMPLANT
GOWN STRL REUS W/ TWL XL LVL3 (GOWN DISPOSABLE) ×2 IMPLANT
GOWN STRL REUS W/TWL LRG LVL3 (GOWN DISPOSABLE) ×4
GOWN STRL REUS W/TWL XL LVL3 (GOWN DISPOSABLE) ×4
GUIDEWIRE ENROUTE 0.014 (WIRE) ×2 IMPLANT
HEMOSTAT SNOW SURGICEL 2X4 (HEMOSTASIS) IMPLANT
INTRODUCER KIT GALT 7CM (INTRODUCER) ×2
KIT BASIN OR (CUSTOM PROCEDURE TRAY) ×2 IMPLANT
KIT ENCORE 26 ADVANTAGE (KITS) ×2 IMPLANT
KIT INTRODUCER GALT 7 (INTRODUCER) ×2 IMPLANT
KIT TURNOVER KIT B (KITS) ×2 IMPLANT
NDL HYPO 25GX1X1/2 BEV (NEEDLE) IMPLANT
NDL PERC 18GX7CM (NEEDLE) IMPLANT
NEEDLE HYPO 25GX1X1/2 BEV (NEEDLE) IMPLANT
NEEDLE PERC 18GX7CM (NEEDLE) ×2 IMPLANT
PACK CAROTID (CUSTOM PROCEDURE TRAY) ×2 IMPLANT
POSITIONER HEAD DONUT 9IN (MISCELLANEOUS) ×2 IMPLANT
SET MICROPUNCTURE 5F STIFF (MISCELLANEOUS) IMPLANT
STENT TRANSCAROTID SYSTEM 9X30 (Permanent Stent) IMPLANT
SURGIFLO W/THROMBIN 8M KIT (HEMOSTASIS) IMPLANT
SUT PROLENE 5 0 C 1 24 (SUTURE) ×4 IMPLANT
SUT PROLENE 6 0 BV (SUTURE) IMPLANT
SUT SILK 2 0 PERMA HAND 18 BK (SUTURE) ×2 IMPLANT
SUT SILK 2 0 SH (SUTURE) ×2 IMPLANT
SUT VIC AB 3-0 SH 27 (SUTURE) ×4
SUT VIC AB 3-0 SH 27X BRD (SUTURE) ×4 IMPLANT
SUT VIC AB 4-0 PS2 27 (SUTURE) ×2 IMPLANT
SYR 10ML LL (SYRINGE) ×6 IMPLANT
SYR 20ML LL LF (SYRINGE) ×2 IMPLANT
SYR CONTROL 10ML LL (SYRINGE) IMPLANT
SYSTEM TRANSCAROTID NEUROPRTCT (MISCELLANEOUS) ×2 IMPLANT
TOWEL GREEN STERILE (TOWEL DISPOSABLE) ×2 IMPLANT
TRANSCAROTID NEUROPROTECT SYS (MISCELLANEOUS) ×2
WATER STERILE IRR 1000ML POUR (IV SOLUTION) ×2 IMPLANT
WIRE BENTSON .035X145CM (WIRE) ×2 IMPLANT

## 2021-12-06 NOTE — Progress Notes (Signed)
Patient seen and examined in the PACU following left sided TCAR There is no hematoma within the neck or groin She is moving all 4 extremities to command. Hemodynamics are appropriate. Stable for transfer to 4 E.  Durene Cal

## 2021-12-06 NOTE — Progress Notes (Signed)
PHARMACIST LIPID MONITORING   Amy Sheppard is a 81 y.o. female admitted on 12/06/2021 s/p TCAR.  Pharmacy has been consulted to optimize lipid-lowering therapy with the indication of secondary prevention for clinical ASCVD.  Recent Labs:  Lipid Panel (last 6 months):   Lab Results  Component Value Date   CHOL 177 11/28/2021   TRIG 213 (H) 11/28/2021   HDL 45 11/28/2021   CHOLHDL 3.9 11/28/2021   VLDL 43 (H) 11/28/2021   LDLCALC 89 11/28/2021    Hepatic function panel (last 6 months):   Lab Results  Component Value Date   AST 19 12/06/2021   ALT 15 12/06/2021   ALKPHOS 48 12/06/2021   BILITOT 1.0 12/06/2021    SCr (since admission):   Serum creatinine: 0.82 mg/dL 03/27/70 5366 Estimated creatinine clearance: 58.4 mL/min  Current therapy and lipid therapy tolerance Current lipid-lowering therapy: Atorvastatin 40mg /d Previous lipid-lowering therapies (if applicable): Simvastatin 20mg /d Documented or reported allergies or intolerances to lipid-lowering therapies (if applicable): none   Plan:    1.Statin intensity (high intensity recommended for all patients regardless of the LDL):  No statin changes. The patient is already on a high intensity statin. -She was placed on atorvastatin 40mg  in August 2023 and was on simvastatin before that.   2.Add ezetimibe (if any one of the following):   Not indicated at this time.  3.Refer to lipid clinic:   No  4.Follow-up with:  Primary care provider - , MD  5.Follow-up labs after discharge:  Consider rechecking lipids in about 8 weeks and increase atorvastatin to 80mg /day and could add ezetimibe if needed  , PharmD Clinical Pharmacist **Pharmacist phone directory can now be found on [email or web address] ([email or web address]).  Listed under [facility].

## 2021-12-06 NOTE — Anesthesia Procedure Notes (Signed)
Arterial Line Insertion Start/End9/13/2023 8:05 AM, 12/06/2021 8:10 AM Performed by: Collene Schlichter, MD, Macie Burows, CRNA, CRNA  Patient location: Pre-op. Preanesthetic checklist: patient identified, IV checked, site marked, risks and benefits discussed, surgical consent, monitors and equipment checked, pre-op evaluation, timeout performed and anesthesia consent Lidocaine 1% used for infiltration Right, radial was placed Catheter size: 20 G Hand hygiene performed  and maximum sterile barriers used   Attempts: 1 Procedure performed without using ultrasound guided technique. Following insertion, dressing applied and Biopatch. Post procedure assessment: normal and unchanged  Patient tolerated the procedure well with no immediate complications.

## 2021-12-06 NOTE — Transfer of Care (Addendum)
Immediate Anesthesia Transfer of Care Note  Patient: Amy Sheppard  Procedure(s) Performed: TRANSCAROTID ARTERY REVASCULARIZATION (Left: Neck) ULTRASOUND GUIDANCE FOR VASCULAR ACCESS (Right: Groin)  Patient Location: PACU  Anesthesia Type:General  Level of Consciousness: awake and patient cooperative  Airway & Oxygen Therapy: Patient Spontanous Breathing and Patient connected to nasal cannula oxygen  Post-op Assessment: Report given to RN, Post -op Vital signs reviewed and stable, Patient moving all extremities X 4 and Patient able to stick tongue midline  Post vital signs: Reviewed and stable  Last Vitals:  Vitals Value Taken Time  BP 127/65 12/06/21 1133  Temp    Pulse 61 12/06/21 1137  Resp 17 12/06/21 1137  SpO2 96 % 12/06/21 1137  Vitals shown include unvalidated device data.  Last Pain:  Vitals:   12/06/21 0734  TempSrc:   PainSc: 0-No pain         Complications: No notable events documented.

## 2021-12-06 NOTE — Op Note (Addendum)
Patient name: Amy Sheppard MRN: [id number] DOB: Nov 26, 1940 Sex: female  12/06/2021 Pre-operative Diagnosis: Symptomatic left carotid stenosis Post-operative diagnosis:  Same Surgeon:  Durene Cal Assistants:  Clinton Gallant, PA Procedure:   #1: Left carotid stent (TCAR)   #2: Retrograde flow reversal neuro protection   #3: Ultrasound-guided access, right common femoral vein Anesthesia:  General Blood Loss: 100 cc Specimens:  none  Findings: Approximate 50-60% stenosis, resolved after stenting  Stent:  ENROUTE 9 x 40 Predilatation balloon: 5 x 30 Flow reversal time: 18 minutes Contrast: 45 cc Dose area 32.10 Fluoroscopy time: 6.3 minutes Procedure time: 90 minutes  Indications: This is a 81 year old female who presented with 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  She underwent an extensive work-up.  The most likely etiology was the soft mixed plaque within her common carotid and proximal internal carotid artery.  Despite this not having a significant stenosis both the stroke service and myself felt this was the etiology of her stroke and therefore we felt this needed to be addressed.  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  Procedure:  The patient was identified in the holding area and taken to [facility] ROOM 16  The patient was then placed supine on the table. general anesthesia was administered.  The patient was prepped and draped in the usual sterile fashion.  A time out was called and antibiotics were administered.  A PA was necessary to expedite the procedure and assist with technical details.  Ultrasound used to evaluate the right common femoral vein which was widely patent and easily compressible.  It was cannulated under ultrasound guidance with a micropuncture needle.  A 018 wire was then advanced followed by placement of micropuncture sheath.  A Bentson  wire was then inserted into the vena cava under fluoroscopic visualization, and the TCAR sheath was placed.  Next, the common carotid artery was identified by ultrasound above the clavicle.  I made a vertical incision along the border of the sternocleidomastoid, in case I needed to extend into a carotid endarterectomy incision.  Cautery was used divide the platysma muscle.  Identified the medial edge of the sternocleidomastoid and reflected this laterally.  I then isolated the common carotid artery.  This was a soft artery without disease.  It was encircled with an umbilical tape.  The patient was then fully heparinized.  A 5-0 Prolene U stitch was placed in the adventitia of the common carotid artery.  The artery was cannulated with a micropuncture needle.  The 018 wire was inserted up to the mark on the wire and the micropuncture sheath was inserted for 2 and half centimeters.  Angiography was performed that showed the bifurcation.  I did not feel there was significant length in the carotid artery to place the carotid Amplatz and so I reinserted the 018 wire and directed this into the external iliac artery.  The micropuncture sheath was then advanced with the dilator into the external carotid.  The carotid Amplatz wire was then placed.  Next, the TCAR sheath was inserted.  It was then sutured into position.  Passive flow reversal was then established by connecting the tubing and confirming it with a saline flush.  A arteriogram was then performed with the appropriate angles on the image detector.  I felt that we could land the stent below the curve in the artery and so we elected  to proceed with TCAR.  A TCAR timeout was performed a Henley clamp was placed on the proximal common carotid artery and active flow reversal was initiated and confirmed with a saline flush.  A 014 wire with the predilation 5 x 30 balloon was advanced into the internal carotid artery.  Predilation was then performed taking the balloon to  nominal pressure for 30 seconds.  There was minimal hemodynamic response.  Next, the ENROUTE 9 x 40 stent was inserted.  Additional imaging was performed to make sure that the stent was in the appropriate location and then it was deployed.  It did deploy into the sheath which had to be backed out a little bit to fully deploy the stent.  When backing the sheath out, I inserted a 5 x 30 balloon to make sure the stent did not move.  I then waited 2 minutes and performed completion angiography.  This showed a widely patent internal carotid artery with successful exclusion of the lesion and no residual stenosis.  I was very satisfied with the results.  There was no kinking within the artery.  The wire was then removed.  Flow reversal was discontinued.  The sheath was removed from the carotid artery and the arteriotomy closed by securing the previously placed Prolene suture.  Hand-held Doppler was used to confirm excellent signals within the common carotid artery.  The patient's heparin was then reversed with 50 mg of protamine.  The sheath was removed and manual pressure was held by the PA for hemostasis in the groin.  The neck incision was irrigated.  Hemostasis was achieved.  The platysma muscle was then reapproximated with 3-0 Vicryl, the skin closed with 4-0 Vicryl.  Dermabond is placed on the incision.  The patient was successfully extubated, found to be moving all 4 extremities to command.  She was taken the recovery in stable condition.  The PA was essential for the procedure by helping provide exposure with suction and retraction as well as assisting with wire manipulation.   Disposition: To PACU stable.   Juleen China, M.D., [facility] Office: [phone] Pager:  [phone]

## 2021-12-06 NOTE — Anesthesia Postprocedure Evaluation (Signed)
Anesthesia Post Note  Patient: Jalysa Swopes  Procedure(s) Performed: TRANSCAROTID ARTERY REVASCULARIZATION (Left: Neck) ULTRASOUND GUIDANCE FOR VASCULAR ACCESS (Right: Groin)     Patient location during evaluation: PACU Anesthesia Type: General Level of consciousness: awake and alert Pain management: pain level controlled Vital Signs Assessment: post-procedure vital signs reviewed and stable Respiratory status: spontaneous breathing, nonlabored ventilation, respiratory function stable and patient connected to nasal cannula oxygen Cardiovascular status: blood pressure returned to baseline and stable Postop Assessment: no apparent nausea or vomiting Anesthetic complications: no   No notable events documented.  Last Vitals:  Vitals:   12/06/21 1230 12/06/21 1245  BP: (!) 144/71 132/72  Pulse: (!) 56 (!) 55  Resp: 11 12  Temp:    SpO2: 93% 95%    Last Pain:  Vitals:   12/06/21 1230  TempSrc:   PainSc: 0-No pain                 Collene Schlichter

## 2021-12-06 NOTE — Interval H&P Note (Signed)
History and Physical Interval Note:  12/06/2021 8:26 AM  Amy Sheppard  has presented today for surgery, with the diagnosis of Left carotid artery stenosis.  The various methods of treatment have been discussed with the patient and family. After consideration of risks, benefits and other options for treatment, the patient has consented to  Procedure(s): Transcarotid Artery Revascularization (Left) as a surgical intervention.  The patient's history has been reviewed, patient examined, no change in status, stable for surgery.  I have reviewed the patient's chart and labs.  Questions were answered to the patient's satisfaction.     Wells Doneisha Ivey Discussed possibility of CEA if carotid is too tortous  WB

## 2021-12-06 NOTE — Anesthesia Procedure Notes (Signed)
Procedure Name: Intubation Date/Time: 12/06/2021 8:47 AM  Performed by: Macie Burows, CRNAPre-anesthesia Checklist: Patient identified, Emergency Drugs available, Suction available and Patient being monitored Patient Re-evaluated:Patient Re-evaluated prior to induction Oxygen Delivery Method: Circle system utilized Preoxygenation: Pre-oxygenation with 100% oxygen Induction Type: IV induction Ventilation: Mask ventilation without difficulty Laryngoscope Size: Miller and 2 Grade View: Grade I Tube type: Oral Tube size: 7.0 mm Number of attempts: 1 Airway Equipment and Method: Stylet Placement Confirmation: ETT inserted through vocal cords under direct vision, positive ETCO2 and breath sounds checked- equal and bilateral Secured at: 21 cm Tube secured with: Tape Dental Injury: Teeth and Oropharynx as per pre-operative assessment

## 2021-12-07 ENCOUNTER — Encounter (HOSPITAL_COMMUNITY): Payer: Self-pay | Admitting: Surgery

## 2021-12-07 LAB — CBC
HCT: 31.6 % — ABNORMAL LOW (ref 36.0–46.0)
Hemoglobin: 10.2 g/dL — ABNORMAL LOW (ref 12.0–15.0)
MCH: 30.4 pg (ref 26.0–34.0)
MCHC: 32.3 g/dL (ref 30.0–36.0)
MCV: 94 fL (ref 80.0–100.0)
Platelets: 230 10*3/uL (ref 150–400)
RBC: 3.36 MIL/uL — ABNORMAL LOW (ref 3.87–5.11)
RDW: 12.9 % (ref 11.5–15.5)
WBC: 10 10*3/uL (ref 4.0–10.5)
nRBC: 0 % (ref 0.0–0.2)

## 2021-12-07 LAB — BASIC METABOLIC PANEL
Anion gap: 7 (ref 5–15)
BUN: 11 mg/dL (ref 8–23)
CO2: 27 mmol/L (ref 22–32)
Calcium: 8.7 mg/dL — ABNORMAL LOW (ref 8.9–10.3)
Chloride: 102 mmol/L (ref 98–111)
Creatinine, Ser: 0.67 mg/dL (ref 0.44–1.00)
GFR, Estimated: >60 mL/min (ref >60)
Glucose, Bld: 123 mg/dL — ABNORMAL HIGH (ref 70–99)
Potassium: 4.6 mmol/L (ref 3.5–5.1)
Sodium: 136 mmol/L (ref 135–145)

## 2021-12-07 MED ORDER — OXYCODONE-ACETAMINOPHEN 5-325 MG PO TABS: 1.0000 | ORAL_TABLET | Freq: Four times a day (QID) | ORAL | 0 refills | Status: DC | PRN

## 2021-12-07 NOTE — Discharge Instructions (Signed)
   [facility]  Discharge Instructions   Carotid Endarterectomy (CEA)  Please refer to the following instructions for your post-procedure care. Your surgeon or physician assistant will discuss any changes with you.  Activity  You are encouraged to walk as much as you can. You can slowly return to normal activities but must avoid strenuous activity and heavy lifting until your doctor tell you it's OK. Avoid activities such as vacuuming or swinging a golf club. You can drive after one week if you are comfortable and you are no longer taking prescription pain medications. It is normal to feel tired for serval weeks after your surgery. It is also normal to have difficulty with sleep habits, eating, and bowel movements after surgery. These will go away with time.  Bathing/Showering  You may shower after you come home. Do not soak in a bathtub, hot tub, or swim until the incision heals completely.  Incision Care  Shower every day. Clean your incision with mild soap and water. Pat the area dry with a clean towel. You do not need a bandage unless otherwise instructed. Do not apply any ointments or creams to your incision. You may have skin glue on your incision. Do not peel it off. It will come off on its own in about one week. Your incision may feel thickened and raised for several weeks after your surgery. This is normal and the skin will soften over time. For Men Only: It's OK to shave around the incision but do not shave the incision itself for 2 weeks. It is common to have numbness under your chin that could last for several months.  Diet  Resume your normal diet. There are no special food restrictions following this procedure. A low fat/low cholesterol diet is recommended for all patients with vascular disease. In order to heal from your surgery, it is CRITICAL to get adequate nutrition. Your body requires vitamins, minerals, and protein. Vegetables are the best  source of vitamins and minerals. Vegetables also provide the perfect balance of protein. Processed food has little nutritional value, so try to avoid this.        Medications  Resume taking all of your medications unless your doctor or physician assistant tells you not to. If your incision is causing pain, you may take over-the- counter pain relievers such as acetaminophen (Tylenol). If you were prescribed a stronger pain medication, please be aware these medications can cause nausea and constipation. Prevent nausea by taking the medication with a snack or meal. Avoid constipation by drinking plenty of fluids and eating foods with a high amount of fiber, such as fruits, vegetables, and grains. Do not take Tylenol if you are taking prescription pain medications.  Follow Up  Our office will schedule a follow up appointment 2-3 weeks following discharge.  Please call us immediately for any of the following conditions  Increased pain, redness, drainage (pus) from your incision site. Fever of 101 degrees or higher. If you should develop stroke (slurred speech, difficulty swallowing, weakness on one side of your body, loss of vision) you should call 911 and go to the nearest emergency room.  Reduce your risk of vascular disease:  Stop smoking. If you would like help call [facility] at 1-800-QUIT-NOW (1-800-784-8669) or [place] at 336-586-4000. Manage your cholesterol Maintain a desired weight Control your diabetes Keep your blood pressure down  If you have any questions, please call the office at [phone].   

## 2021-12-07 NOTE — Progress Notes (Signed)
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  POD # 1 left TCAR for symptomatic carotid stenosis   No neurologic deficits,  Pending mobility possible discharge later today Continue ASA, Plavix and Statin daily  Mosetta Pigeon 12/07/2021 6:53 AM --  Laboratory Lab Results: Recent Labs    12/06/21 0723  WBC 5.5  HGB 12.4  HCT 38.5  PLT 246   BMET Recent Labs    12/06/21 0723  NA 136  K 4.3  CL 102  CO2 26  GLUCOSE 108*  BUN 8  CREATININE 0.82  CALCIUM 9.3    COAG Lab Results  Component Value Date   INR 1.0 12/06/2021   INR 1.1 11/28/2021   INR 1.10 11/03/2015   No results found for: "PTT"

## 2021-12-07 NOTE — Progress Notes (Signed)
Nsg Discharge Note  Admit Date:  12/06/2021 Discharge date: 12/07/2021   [name] to be D/C'd Home per MD order.  AVS completed. Patient/caregiver able to verbalize understanding.  Discharge Medication: Allergies as of 12/07/2021       Reactions   Tetanus Toxoids Swelling   WELTS        Medication List     TAKE these medications    Aspirin Low Dose 81 MG tablet Generic drug: aspirin EC Take 1 tablet (81 mg total) by mouth daily. Swallow whole.   atenolol 25 MG tablet Commonly known as: TENORMIN Take 25 mg by mouth daily.   atorvastatin 40 MG tablet Commonly known as: LIPITOR Take 1 tablet (40 mg total) by mouth daily.   CALCIUM PO Take 1 tablet by mouth daily.   clopidogrel 75 MG tablet Commonly known as: PLAVIX Take 1 tablet (75 mg total) by mouth daily for 21 days.   FeroSul 325 (65 FE) MG tablet Generic drug: ferrous sulfate Take 1 tablet by mouth in the morning and at bedtime.   FLUoxetine 10 MG capsule Commonly known as: PROZAC Take 1 capsule (10 mg total) by mouth daily.   gabapentin 300 MG capsule Commonly known as: NEURONTIN Take 300 mg by mouth 2 (two) times daily.   letrozole 2.5 MG tablet Commonly known as: FEMARA Take 2.5 mg by mouth daily.   lisinopril 40 MG tablet Commonly known as: ZESTRIL Take 1 tablet by mouth daily.   omeprazole 40 MG capsule Commonly known as: PRILOSEC Take 40 mg by mouth daily.   oxyCODONE-acetaminophen 5-325 MG tablet Commonly known as: PERCOCET/ROXICET Take 1 tablet by mouth every 6 (six) hours as needed.   potassium chloride 10 MEQ tablet Commonly known as: KLOR-CON M Take 10 mEq by mouth daily.   VITAMIN D PO Take 1 tablet by mouth daily.   Vitamins/Minerals Tabs Take 1 tablet by mouth daily.        Discharge Assessment: Vitals:   12/07/21 0753 12/07/21 0815  BP: 116/89 128/67  Pulse: 63 64  Resp: 16 16  Temp: 98 F (36.7 C) 98.2 F (36.8 C)  SpO2: 93% 94%   Skin clean, dry and  intact without evidence of skin break down, no evidence of skin tears noted. IV catheter discontinued intact. Site without signs and symptoms of complications - no redness or edema noted at insertion site, patient denies c/o pain - only slight tenderness at site.  Dressing with slight pressure applied.  D/c Instructions-Education: Discharge instructions given to patient/family with verbalized understanding. D/c education completed with patient/family including follow up instructions, medication list, d/c activities limitations if indicated, with other d/c instructions as indicated by MD - patient able to verbalize understanding, all questions fully answered. Patient instructed to return to ED, call 911, or call MD for any changes in condition.  Patient escorted via WC, and D/C home via private auto.  Kizzie Bane, RN 12/07/2021 10:47 AM

## 2021-12-07 NOTE — Progress Notes (Signed)
Mobility Specialist Progress Note:   12/07/21 0908  Mobility  Activity Ambulated with assistance in hallway  Level of Assistance Standby assist, set-up cues, supervision of patient - no hands on  Assistive Device Front wheel walker  Distance Ambulated (ft) 500 ft  Activity Response Tolerated well  $Mobility charge 1 Mobility   Pt received in bed willing to participate in mobility. No complaints of pain. Left in bed with call bell in reach and all needs met.   [facility] Surveyor, mining Chat only

## 2021-12-07 NOTE — Progress Notes (Signed)
   12/06/21 2000  Neurological  Neuro (WDL) X  Orientation Level Oriented X4  Cognition Appropriate at baseline;Appropriate attention/concentration;Appropriate judgement;Appropriate safety awareness;Appropriate for developmental age;Follows commands  Speech Clear;Appropriate for developmental age;Appropriate at baseline  R Pupil Size (mm) 4  R Pupil Shape Round  R Pupil Reaction Brisk  L Pupil Size (mm) 5  L Pupil Shape Round  L Pupil Reaction Brisk  Motor Function/Sensation Assessment Grip;Head;Motor strength;Sensation;Motor response;Leg ataxia;Arm ataxia;Plantar flexion;Dorsiflexion;Pronator drift;Elbow flexion;Elbow extension  Facial Symmetry Asymmetrical left  Facial Droop Left, minor asymmetrical left lower lips.  R Hand Grip Strong  L Hand Grip Strong  R Elbow Extension (Push/Biceps) Strong  L Elbow Extension (Push/Biceps) Strong  R Elbow Flexion (Pull/Triceps) Strong  L Elbow Flexion (Pull/Triceps) Strong  Right Pronator Drift Absent  Left Pronator Drift Absent  R Foot Dorsiflexion Strong  L Foot Dorsiflexion Strong  R Foot Plantar Flexion Strong  L Foot Plantar Flexion Strong  R Finger to Nose (Point to [employer]) Smooth  L Finger to Nose (Point to [employer]) Smooth  R Heel to [employer] (Point to [employer]) Smooth  L Heel to [employer] to [employer]) [employer] Response Purposeful movement  RUE Sensation Full sensation  RUE Motor Strength 5  LUE Motor Response Purposeful movement  LUE Sensation Full sensation  LUE Motor Strength 5  RLE Motor Response Purposeful movement  RLE Sensation Full sensation  RLE Motor Strength 5  LLE Motor Response Purposeful movement  LLE Sensation Full sensation  LLE Motor Strength 5  Neuro Symptoms Fatigue  Neuro symptoms relieved by Relaxation techniques (Comment);Rest  Glasgow Coma Scale  Eye Opening 4  Best Verbal Response (NON-intubated) 5  Best Motor Response 6  Glasgow Coma Scale Score 15  NIH Stroke Scale ( + Modified Stroke Scale  Criteria)   Interval Shift assessment  Level of Consciousness (1a.)    0  LOC Questions (1b. )   + 0  LOC Commands (1c. )   +  0  Best Gaze (2. )  + 0  Visual (3. )  + 1  MD aware, impaired/ blurred vision on left eye lower lateral visual field prior the TCAR surgery.  Facial Palsy (4. )     1, minor asymmetrical left lower lips.  Motor Arm, Left (5a. )   + 0  Motor Arm, Right (5b. )   + 0  Motor Leg, Left (6a. )   + 0  Motor Leg, Right (6b. )   + 0  Limb Ataxia (7. ) 0  Sensory (8. )   + 0  Best Language (9. )   + 0  Dysarthria (10. ) 0  Extinction/Inattention (11.)   + 0  Modified SS Total  + 1  Complete NIHSS TOTAL 2   She is hemodynamically stable. NSR on the monitor. No acute distress. Left neck incision is soft, no hematoma, but has some small bruising, no bleeding. Right groin is dry and clean. She has rested well, denied pain. We will continue to monitor.   Filiberto Pinks, RN

## 2021-12-08 NOTE — Discharge Summary (Signed)
[facility] Discharge Summary   Patient ID:  Amy Sheppard MRN: [id number] DOB/AGE: [birth year] 81 y.o.  Admit date: 12/06/2021 Discharge date: 12/07/21 Date of Surgery: 12/06/2021 Surgeon: Surgeon(s): Nada Libman, MD  Admission Diagnosis: Carotid stenosis [I65.29]  Discharge Diagnoses:  Carotid stenosis [I65.29]  Secondary Diagnoses: Past Medical History:  Diagnosis Date   GERD (gastroesophageal reflux disease)    High cholesterol    Hypertension    Lymphocytic colitis    Migraine    "never anymore; it's been awhile since I've had one" (11/01/2015)   UTI (lower urinary tract infection) X 1    Procedure(s): TRANSCAROTID ARTERY REVASCULARIZATION ULTRASOUND GUIDANCE FOR VASCULAR ACCESS  Discharged Condition: stable  HPI: This is a 81 year old female who presented with 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  She underwent an extensive work-up.  The most likely etiology was the soft mixed plaque within her common carotid and proximal internal carotid artery.  Despite this not having a significant stenosis both the stroke service and myself felt this was the etiology of her stroke and therefore we felt this needed to be addressed.  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    Hospital Course:  Amy Sheppard is a 81 y.o. female is S/P  Procedure(s): TRANSCAROTID ARTERY REVASCULARIZATION ULTRASOUND GUIDANCE FOR VASCULAR ACCESS No issues post op.  No neurologic deficits, no hematoma, or swallowing difficulties. Continue ASA, Plavix and Statin daily.  Discharged home post op day #1.    Significant Diagnostic Studies: CBC Lab Results  Component Value Date   WBC 10.0 12/07/2021   HGB 10.2 (L) 12/07/2021   HCT 31.6 (L) 12/07/2021   MCV 94.0 12/07/2021   PLT 230 12/07/2021    BMET    Component Value Date/Time   NA 136 12/07/2021  0600   K 4.6 12/07/2021 0600   CL 102 12/07/2021 0600   CO2 27 12/07/2021 0600   GLUCOSE 123 (H) 12/07/2021 0600   BUN 11 12/07/2021 0600   CREATININE 0.67 12/07/2021 0600   CALCIUM 8.7 (L) 12/07/2021 0600   GFRNONAA >60 12/07/2021 0600   GFRAA >60 05/30/2016 0803   COAG Lab Results  Component Value Date   INR 1.0 12/06/2021   INR 1.1 11/28/2021   INR 1.10 11/03/2015     Disposition:  Discharge to :Home Discharge Instructions     Call MD for:  redness, tenderness, or signs of infection (pain, swelling, bleeding, redness, odor or green/yellow discharge around incision site)   Complete by: As directed    Call MD for:  severe or increased pain, loss or decreased feeling  in affected limb(s)   Complete by: As directed    Call MD for:  temperature >100.5   Complete by: As directed    Resume previous diet   Complete by: As directed       Allergies as of 12/07/2021       Reactions   Tetanus Toxoids Swelling   WELTS        Medication List     TAKE these medications    Aspirin Low Dose 81 MG tablet Generic drug: aspirin EC Take 1 tablet (81 mg total) by mouth daily. Swallow whole.   atenolol 25 MG tablet Commonly known as: TENORMIN Take 25 mg by mouth daily.   atorvastatin 40 MG tablet Commonly known as: LIPITOR Take 1 tablet (40 mg total) by mouth  daily.   CALCIUM PO Take 1 tablet by mouth daily.   clopidogrel 75 MG tablet Commonly known as: PLAVIX Take 1 tablet (75 mg total) by mouth daily for 21 days.   FeroSul 325 (65 FE) MG tablet Generic drug: ferrous sulfate Take 1 tablet by mouth in the morning and at bedtime.   FLUoxetine 10 MG capsule Commonly known as: PROZAC Take 1 capsule (10 mg total) by mouth daily.   gabapentin 300 MG capsule Commonly known as: NEURONTIN Take 300 mg by mouth 2 (two) times daily.   letrozole 2.5 MG tablet Commonly known as: FEMARA Take 2.5 mg by mouth daily.   lisinopril 40 MG tablet Commonly known as:  ZESTRIL Take 1 tablet by mouth daily.   omeprazole 40 MG capsule Commonly known as: PRILOSEC Take 40 mg by mouth daily.   oxyCODONE-acetaminophen 5-325 MG tablet Commonly known as: PERCOCET/ROXICET Take 1 tablet by mouth every 6 (six) hours as needed.   potassium chloride 10 MEQ tablet Commonly known as: KLOR-CON M Take 10 mEq by mouth daily.   VITAMIN D PO Take 1 tablet by mouth daily.   Vitamins/Minerals Tabs Take 1 tablet by mouth daily.       Verbal and written Discharge instructions given to the patient. Wound care per Discharge AVS  Follow-up Information     Nada Libman, MD Follow up in 4 week(s).   Specialties: Vascular Surgery, Cardiology Why: Office will call you to arrange your appt (sent) Contact information: [address] [place] [address] [phone]                 Signed: Mosetta Pigeon 12/08/2021, 8:34 AM  --- For VQI Registry use --- Instructions: Press F2 to tab through selections.  Delete question if not applicable.   Modified Rankin score at D/C (0-6): Rankin Score=0  IV medication needed for:  1. Hypertension: No 2. Hypotension: No  Post-op Complications: No  1. Post-op CVA or TIA: No  If yes: Event classification (right eye, left eye, right cortical, left cortical, verterobasilar, other):   If yes: Timing of event (intra-op, <6 hrs post-op, >=6 hrs post-op, unknown):   2. CN injury: No  If yes: CN  injuried   3. Myocardial infarction: No  If yes: Dx by (EKG or clinical, Troponin):   4.  CHF: No  5.  Dysrhythmia (new): No  6. Wound infection: No  7. Reperfusion symptoms: No  8. Return to OR: No  If yes: return to OR for (bleeding, neurologic, other CEA incision, other):   Discharge medications: Statin use:  Yes ASA use:  Yes Beta blocker use:  No ACE-Inhibitor use:  Yes P2Y12 Antagonist use: [ ]  None, [ x] Plavix, [ ]  Plasugrel, [ ]  Ticlopinine, [ ]  Ticagrelor, [ ]  Other, [ ]  No for medical  reason, [ ]  Non-compliant, [ ]  Not-indicated Anti-coagulant use:  [x ] None, [ ]  Warfarin, [ ]  Rivaroxaban, [ ]  Dabigatran, [ ]  Other, [ ]  No for medical reason, [ ]  Non-compliant, [ ]  Not-indicated

## 2021-12-21 NOTE — Progress Notes (Signed)
[facility] [address] [place] [address] [phone]       HOSPITAL FOLLOW UP NOTE  [name] Date of Birth:  March 26, 1941 Medical Record Number:  [id number]    Primary neurologist: Dr. Leonie Man Reason for Referral:  hospital stroke follow up    SUBJECTIVE:   CHIEF COMPLAINT:  Chief Complaint  Patient presents with   Follow-up    Rm 3 alone Pt is well, reports loss of vision in L eye. No other concerns     HPI:   [name] is a 81 y.o. female with history of HTN, HLD, colitis, migraine who presented on 11/28/2021 with left vision loss.  Personally reviewed hospitalization, progress notes, lab work and imaging.  Evaluated by Dr. 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  CT and MRI no acute infarct.  CTA head/neck showed left proximal ICA 40% stenosis but was soft and calcified irregular plaque. CUS unremarkable.  EF 55 to 60%.  LDL 89.  A1c 6.1.  Evaluated by vascular surgery to pursue left TCAR the following week, placed on aspirin and Plavix until that time with further regimen for vascular surgery.  Switched simvastatin to atorvastatin 40 mg daily.  No prior stroke history.  Therapies recommended outpatient PT.   Today, 12/25/2021, patient is being seen for initial hospital follow up unaccompanied.  Reports continued left eye visual loss, no significant change since discharge.  Has had f/u with eye doctor at [facility]. No new stroke/TIA symptoms.   S/p L TCAR by Dr. Trula Slade on 6/27 without complication. Remains on aspirin and atorvastatin, has f/u visit on 10/9.  Blood pressure today 137/88, monitors at home and has been stable.   No further concerns at this time     PERTINENT IMAGING  Per hospitalization 11/28/2021 CT no acute finding MRI  no acute infarct CTA head and neck diffuse atherosclerosis at aortic arch, left subclavian A, left ICA bulb, b/l siphons. Left proximal ICA 40% stenosis but with  soft and calcified irregular plaque.  CUS - unremarkable 2D Echo  EF 55-60% LDL 89 HgbA1c 6.1    ROS:   14 system review of systems performed and negative with exception of those listed in HPI  PMH:  Past Medical History:  Diagnosis Date   GERD (gastroesophageal reflux disease)    High cholesterol    Hypertension    Lymphocytic colitis    Migraine    "never anymore; it's been awhile since I've had one" (11/01/2015)   UTI (lower urinary tract infection) X 1    PSH:  Past Surgical History:  Procedure Laterality Date   ERCP N/A 11/08/2015   Procedure: ENDOSCOPIC RETROGRADE CHOLANGIOPANCREATOGRAPHY (ERCP);  Surgeon: Clarene Essex, MD;  Location[facility] ENDOSCOPY;  Service: Endoscopy;  Laterality: N/A;   ESOPHAGOGASTRODUODENOSCOPY N/A 11/02/2015   Procedure: ESOPHAGOGASTRODUODENOSCOPY (EGD);  Surgeon: Clarene Essex, MD;  Location: [facility] ENDOSCOPY;  Service: Endoscopy;  Laterality: N/A;   IR GENERIC HISTORICAL  11/03/2015   IR INT EXT BILIARY DRAIN WITH CHOLANGIOGRAM 11/03/2015 Markus Daft, MD [facility]INTERV RAD   IR GENERIC HISTORICAL  11/17/2015   IR EXCHANGE BILIARY DRAIN 11/17/2015 Greggory Keen, MD [facility]INTER[facility] RAD   IR GENERIC HISTORICAL  11/16/2015   IR RADIOLOGIST EVAL & MGMT 11/16/2015 Corrie Mckusick, DO [facility] INTERV RAD   SPYGLASS CHOLANGIOSCOPY N/A 11/08/2015   Procedure: [id number] CHOLANGIOSCOPY;  Surgeon: Clarene Essex, MD;  Location: [facility] ENDOSCOPY;  Service: Endoscopy;  Laterality: N/A;   TRANSCAROTID ARTERY REVASCULARIZATION  Left 12/06/2021   Procedure: TRANSCAROTID ARTERY REVASCULARIZATION;  Surgeon: Serafina Mitchell, MD;  Location: [facility] OR;  Service: Vascular;  Laterality: Left;   TUBAL LIGATION  1970s   ULTRASOUND GUIDANCE FOR VASCULAR ACCESS Right 12/06/2021   Procedure: ULTRASOUND GUIDANCE FOR VASCULAR ACCESS;  Surgeon: Serafina Mitchell, MD;  Location: [facility] OR;  Service: Vascular;  Laterality: Right;    Social History:  Social History   Socioeconomic History   Marital status: Widowed    Spouse  name: Not on file   Number of children: Not on file   Years of education: Not on file   Highest education level: Not on file  Occupational History   Not on file  Tobacco Use   Smoking status: Former    Packs/day: 1.00    Years: 30.00    Total pack years: 30.00    Types: Cigarettes   Smokeless tobacco: Never  Vaping Use   Vaping Use: Never used  Substance and Sexual Activity   Alcohol use: No   Drug use: No   Sexual activity: Never  Other Topics Concern   Not on file  Social History Narrative   Not on file   Social Determinants of Health   Financial Resource Strain: Not on file  Food Insecurity: No Food Insecurity (11/29/2021)   Hunger Vital Sign    Worried About Running Out of Food in the Last Year: Never true    Ran Out of Food in the Last Year: Never true  Transportation Needs: No Transportation Needs (11/29/2021)   PRAPARE - Hydrologist (Medical): No    Lack of Transportation (Non-Medical): No  Physical Activity: Not on file  Stress: Not on file  Social Connections: Not on file  Intimate Partner Violence: Not on file    Family History: History reviewed. No pertinent family history.  Medications:   Current Outpatient Medications on File Prior to Visit  Medication Sig Dispense Refill   aspirin EC 81 MG tablet Take 1 tablet (81 mg total) by mouth daily. Swallow whole. 30 tablet 0   atenolol (TENORMIN) 25 MG tablet Take 25 mg by mouth daily.     atorvastatin (LIPITOR) 40 MG tablet Take 1 tablet (40 mg total) by mouth daily. 30 tablet 0   CALCIUM PO Take 1 tablet by mouth daily.     FEROSUL 325 (65 Fe) MG tablet Take 1 tablet by mouth in the morning and at bedtime.     FLUoxetine (PROZAC) 10 MG capsule Take 1 capsule (10 mg total) by mouth daily. 30 capsule 0   gabapentin (NEURONTIN) 300 MG capsule Take 300 mg by mouth 2 (two) times daily.     letrozole (FEMARA) 2.5 MG tablet Take 2.5 mg by mouth daily.     lisinopril (ZESTRIL) 40 MG tablet  Take 1 tablet by mouth daily.     omeprazole (PRILOSEC) 40 MG capsule Take 40 mg by mouth daily.     potassium chloride (K-DUR,KLOR-CON) 10 MEQ tablet Take 10 mEq by mouth daily.     VITAMIN D PO Take 1 tablet by mouth daily.     Vitamins/Minerals TABS Take 1 tablet by mouth daily.     No current facility-administered medications on file prior to visit.    Allergies:   Allergies  Allergen Reactions   Tetanus Toxoids Swelling    WELTS      OBJECTIVE:  Physical Exam  Vitals:   12/25/21 0949  Weight: 190 lb (86.2 kg)  Height: 5\' 2"  (1.575 m)   Body mass index is 34.75 kg/m. No results found.   General: well developed, well nourished, very pleasant elderly Caucasian female, seated, in no evident distress Head: head normocephalic and atraumatic.   Neck: supple with no carotid or supraclavicular bruits Cardiovascular: regular rate and rhythm, no murmurs Musculoskeletal: no deformity Skin:  no rash/petichiae Vascular:  Normal pulses all extremities   Neurologic Exam Mental Status: Awake and fully alert.  Fluent speech and language.  Oriented to place and time. Recent and remote memory intact. Attention span, concentration and fund of knowledge appropriate. Mood and affect appropriate.  Cranial Nerves: OD Pupils equal, briskly reactive to light. Extraocular movements full without nystagmus. OD visual field intact, not able to see OS except thin linear window at lower visual field. Hearing intact. Facial sensation intact. Face, tongue, palate moves normally and symmetrically.  Motor: Normal bulk and tone. Normal strength in all tested extremity muscles Sensory.: intact to touch , pinprick , position and vibratory sensation.  Coordination: Rapid alternating movements normal in all extremities. Finger-to-nose and heel-to-shin performed accurately bilaterally. Gait and Station: Arises from chair without difficulty. Stance is normal. Gait demonstrates normal stride length and balance  without use of AD. Reflexes: 1+ and symmetric. Toes downgoing.        ASSESSMENT: Amy Sheppard is a 81 y.o. 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 Vascular risk factors include carotid atherosclerosis, HTN, HLD, advanced age, obesity and migraines.      PLAN:  L CRAO :  Residual deficit: OS visual loss.  Continue to follow with ophthalmology as scheduled Continue aspirin 81mg  daily and atorvastatin (Lipitor) for secondary stroke prevention and s/p L TCAR   Discussed secondary stroke prevention measures and importance of close PCP follow up for aggressive stroke risk factor management including BP goal<130/90, HLD with LDL goal<70 and DM with A1c.<7 .  Stroke labs 11/2021: LDL 89, A1c 6.1 I have gone over the pathophysiology of stroke, warning signs and symptoms, risk factors and their management in some detail with instructions to go to the closest emergency room for symptoms of concern. Carotid arthrosclerosis: s/p L TCAR 12/06/2021.  Remains on aspirin and statin.  F/u with vascular surgery 10/9    Overall stable from stroke standpoint without further recommendations and closely followed by PCP and vascular surgery.  Can follow-up on an as-needed basis at this time, patient agreed to plan.    CC:  [facility] provider: Dr. Leonie Man PCP: Cathlean Sauer, MD    I spent 54 minutes of face-to-face and non-face-to-face time with patient.  This included previsit chart review including review of recent hospitalization, lab review, study review, electronic health record documentation, patient education regarding recent CRAO including etiology, secondary stroke prevention measures and importance of managing stroke risk factors, residual deficits and answered all other questions to patient satisfaction   Frann Rider, AGNP-BC  [facility] [address] [place], [place] [address]  Phone [phone] Fax  [phone] Note: This document was prepared with digital dictation and possible smart phrase technology. Any transcriptional errors that result from this process are unintentional.

## 2021-12-25 ENCOUNTER — Encounter: Payer: Self-pay | Admitting: Adult Health

## 2021-12-25 ENCOUNTER — Ambulatory Visit: Payer: Medicare Other | Admitting: Adult Health

## 2021-12-25 VITALS — BP 137/88 | HR 72 | Ht 62.0 in | Wt 190.0 lb

## 2021-12-25 DIAGNOSIS — H3412 Central retinal artery occlusion, left eye: Secondary | ICD-10-CM | POA: Diagnosis not present

## 2021-12-25 DIAGNOSIS — Z09 Encounter for follow-up examination after completed treatment for conditions other than malignant neoplasm: Secondary | ICD-10-CM

## 2021-12-25 DIAGNOSIS — I6522 Occlusion and stenosis of left carotid artery: Secondary | ICD-10-CM | POA: Diagnosis not present

## 2021-12-25 NOTE — Patient Instructions (Addendum)
Continue aspirin 81 mg daily  and atorvastatin for secondary stroke prevention and for recent TCAR procedure   Continue to follow with eye doctor as scheduled   Continue to follow up with PCP regarding blood pressure and cholesterol management  Maintain strict control of hypertension with blood pressure goal below 130/90 and cholesterol with LDL cholesterol (bad cholesterol) goal below 70 mg/dL.   Signs of a Stroke? Follow the BEFAST method:  Balance Watch for a sudden loss of balance, trouble with coordination or vertigo Eyes Is there a sudden loss of vision in one or both eyes? Or double vision?  Face: Ask the person to smile. Does one side of the face droop or is it numb?  Arms: Ask the person to raise both arms. Does one arm drift downward? Is there weakness or numbness of a leg? Speech: Ask the person to repeat a simple phrase. Does the speech sound slurred/strange? Is the person confused ? Time: If you observe any of these signs, call 911.       Thank you for coming to see [place] at [facility]. I hope we have been able to provide you high quality care today.  You may receive a patient satisfaction survey over the next few weeks. We would appreciate your feedback and comments so that we may continue to improve ourselves and the health of our patients.    Central Retinal Artery Occlusion  Central retinal artery occlusion (CRAO) is a blockage in the main blood vessel that carries blood to the retina (central retinal artery). The retina is the part of your eye that senses light and sends signals to the brain that allow you to see. CRAO can cause complete or partial vision loss. Most cases affect just one eye. Vison loss comes on suddenly (acutely) in most cases, but it can develop slowly over several weeks. If the condition is not treated within 2 hours of acute onset, the vision loss can become permanent. What are the causes? This condition may be caused by: A blood  clot that forms in the artery. A clot is blood that has thickened into a gel or solid. A collection of blood or solid material, such as fat, that forms somewhere else and travels to the artery. A disease that causes the artery to swell. An infection of the heart's inner lining. An injury to the artery. Sometimes the cause is not known. What increases the risk? This condition is more likely to develop in: Males. Adults who are age 41 or older. Women who take birth control pills. Pregnant women. People who have certain medical conditions, including: A blood vessel disease that causes narrowing of the arteries. High levels of fat in the blood. Giant cell arteritis. Diabetes. Heart disease. Sickle cell disease. High blood pressure. A heart rhythm problem. A heart valve condition. A blood clotting disorder. Inflammation of blood vessels. What are the signs or symptoms? The only symptom of this condition is sudden and painless loss of vision. How is this diagnosed? This condition is usually diagnosed by a health care provider who specializes in eye diseases (ophthalmologist). To diagnose the condition, the ophthalmologist will do an eye exam. He or she may also: Use eye drops to help check your eyes. Order tests, including: A vision test. A measurement of the pressure inside your eye. A test that checks the nerves in your retina. A fluorescein angiogram. In this test, pictures are taken of your retina after a dye is injected into your bloodstream.  Optical coherence tomography. In this test, light waves are used to create pictures of your retina. Check your blood pressure. Order other tests to find the cause of the condition. These tests may include: Blood tests to check for vasculitis or coagulopathy. An ultrasound to check for heart disease or atherosclerosis. How is this treated? Acute central retinal artery occlusion is an eye emergency. People with this condition should be  referred to the nearest stroke center for further immediate management. Treatment for this condition may include: Massaging the eye. Medicines, such as steroids to treat any underlying inflammation. Breathing in a mixture of oxygen and carbon dioxide. This widens the arteries of your retina. Having fluid removed from the front of your eye. Using drops to reduce pressure in your eye. An injection of a clot-busting medicine. You may be referred to a specialist or receive additional treatment if any of these apply: Your condition was caused by a blood clot that formed outside your eye. Your optic nerve is swollen. You have symptoms of a condition called giant cell arteritis. Symptoms of this condition include: Headache. Pain with chewing. Scalp tenderness. Recent poor appetite and weight loss. Follow these instructions at home: Use over-the-counter and prescription medicines only as told by your health care provider. These include any eye drops. If you lose some vision again: Breathe into a paper bag. Massage your eye as directed. Get medical care right away. Keep all follow-up visits. This is important. How is this prevented? To help prevent this condition: Work with your health care providers to reduce your risk factors. Do not use any products that contain nicotine or tobacco. These products include cigarettes, chewing tobacco, and vaping devices, such as e-cigarettes. If you need help quitting, ask your health care provider. Maintain a healthy weight. Follow a heart-healthy diet. This includes eating plenty of fruits, vegetables, whole grains, and lean protein, and avoiding foods that are high in saturated fat and sugar. Exercise regularly. Get help right away if: You have eye pain. You have new or sudden vision loss, especially in the other eye. Summary Central retinal artery occlusion (CRAO) is a blockage in the main blood vessel that carries blood to the retina (central retinal  artery). The retina is the part of your eye that senses light and sends signals to the brain that allow you to see. CRAO can make you lose some or all of your vision. If the condition is not treated within 2 hours, it can result in permanent vision loss in the affected eye. Acute central retinal artery occlusion is an eye emergency. People with this condition should be referred to the nearest stroke center for further immediate management. Treatment for this condition may include medicines, eye massage, eye drops, and certain procedures. Get help right away if you have new or sudden vision loss, especially in the other eye. This information is not intended to replace advice given to you by your health care provider. Make sure you discuss any questions you have with your health care provider. Document Revised: 12/29/2019 Document Reviewed: 12/29/2019 Elsevier Patient Education  2023 [employer].

## 2021-12-26 ENCOUNTER — Other Ambulatory Visit: Payer: Self-pay | Admitting: *Deleted

## 2021-12-26 DIAGNOSIS — I6522 Occlusion and stenosis of left carotid artery: Secondary | ICD-10-CM

## 2021-12-28 NOTE — Progress Notes (Signed)
POST OPERATIVE OFFICE NOTE    CC:  F/u for surgery  HPI:  This is a 81 y.o. female who is s/p left TCAR on 12/06/2021 by Dr. Trula Slade for symptomatic left carotid artery stenosis. 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  She underwent an extensive work-up.  The most likely etiology was the soft mixed plaque within her common carotid and proximal internal carotid artery.  Despite this not having a significant stenosis both the stroke service and myself felt this was the etiology of her stroke and therefore felt this needed to be addressed.   Pt returns today for follow up.  Pt states she has done well since surgery.  She still has some vision loss in her left eye.  She states they are making her some glasses to help with her vision.  She says the top part of the vision in the left eye is black.  She denies any trouble swallowing.  She has not had any unilateral weakness, numbness or paralysis.  She states she was told to take plavix for 21 days and then could stop it and continue with asa.  She is compliant with her asa/statin.     Allergies  Allergen Reactions   Tetanus Toxoids Swelling    WELTS    Current Outpatient Medications  Medication Sig Dispense Refill   aspirin EC 81 MG tablet Take 1 tablet (81 mg total) by mouth daily. Swallow whole. 30 tablet 0   atenolol (TENORMIN) 25 MG tablet Take 25 mg by mouth daily.     atorvastatin (LIPITOR) 40 MG tablet Take 1 tablet (40 mg total) by mouth daily. 30 tablet 0   CALCIUM PO Take 1 tablet by mouth daily.     FEROSUL 325 (65 Fe) MG tablet Take 1 tablet by mouth in the morning and at bedtime.     FLUoxetine (PROZAC) 10 MG capsule Take 1 capsule (10 mg total) by mouth daily. 30 capsule 0   gabapentin (NEURONTIN) 300 MG capsule Take 300 mg by mouth 2 (two) times daily.     letrozole (FEMARA) 2.5 MG tablet Take 2.5 mg by mouth daily.     lisinopril (ZESTRIL) 40 MG tablet Take 1 tablet by mouth daily.      omeprazole (PRILOSEC) 40 MG capsule Take 40 mg by mouth daily.     potassium chloride (K-DUR,KLOR-CON) 10 MEQ tablet Take 10 mEq by mouth daily.     VITAMIN D PO Take 1 tablet by mouth daily.     Vitamins/Minerals TABS Take 1 tablet by mouth daily.     No current facility-administered medications for this visit.     ROS:  See HPI  Physical Exam:  Today's Vitals   01/01/22 1325 01/01/22 1328  BP: (!) 158/88 (!) 154/86  Pulse: (!) 59 (!) 59  Resp: 14   Temp: 97.7 F (36.5 C)   TempSrc: Temporal   SpO2: 98%   Weight: 190 lb (86.2 kg)   Height: 5\' 2"  (1.575 m)    Body mass index is 34.75 kg/m.   Incision:  well healed.  Small tail of suture was cut today; right groin soft without hematoma Extremities:  moving all extremities equally    Carotid duplex 01/01/2022: Right:  1-39% carotid artery stenosis Left:  1-39% carotid artery stenosis  Previous carotid duplex 11/29/2021: Bilateral 1-39% carotid artery stenosis   Assessment/Plan:  This is a 81 y.o. female who is s/p:  left TCAR on 12/06/2021 by Dr. Trula Slade  for symptomatic left carotid artery stenosis.  -pt doing well since surgery -duplex today revealed 1-39% bilateral ICA stenosis and patent stent.  She did have 0 EDV but discussed with tech and there is turbulent flow at the bulb and stent is patent.  -she will return in 9 months with duplex.  She knows to call 911 or go to ER if she develops any neurological sx and these were reviewed with her today.   -she will continue her statin and asa.   -She will call sooner if there are any issues before her next visit.    Doreatha Massed, [facility] [phone]   Clinic MD:  Myra Gianotti

## 2022-01-01 ENCOUNTER — Ambulatory Visit (INDEPENDENT_AMBULATORY_CARE_PROVIDER_SITE_OTHER): Payer: Medicare Other | Admitting: Physician Assistant

## 2022-01-01 ENCOUNTER — Ambulatory Visit (HOSPITAL_COMMUNITY)
Admission: RE | Admit: 2022-01-01 | Discharge: 2022-01-01 | Disposition: A | Discharge status: Home / Self Care | Payer: Medicare Other | Source: Ambulatory Visit | Attending: Surgery | Admitting: Surgery

## 2022-01-01 VITALS — BP 154/86 | HR 59 | Temp 97.7°F | Resp 14 | Ht 62.0 in | Wt 190.0 lb

## 2022-01-01 DIAGNOSIS — I6522 Occlusion and stenosis of left carotid artery: Secondary | ICD-10-CM | POA: Insufficient documentation

## 2022-01-01 DIAGNOSIS — I6529 Occlusion and stenosis of unspecified carotid artery: Secondary | ICD-10-CM

## 2022-09-21 ENCOUNTER — Other Ambulatory Visit: Payer: Self-pay | Admitting: *Deleted

## 2022-09-21 DIAGNOSIS — I6529 Occlusion and stenosis of unspecified carotid artery: Secondary | ICD-10-CM

## 2022-10-08 ENCOUNTER — Ambulatory Visit (HOSPITAL_COMMUNITY)
Admission: RE | Admit: 2022-10-08 | Discharge: 2022-10-08 | Disposition: A | Discharge status: Home / Self Care | Payer: Medicare Other | Source: Ambulatory Visit | Attending: Surgery | Admitting: Surgery

## 2022-10-08 ENCOUNTER — Ambulatory Visit: Payer: Medicare Other | Admitting: Physician Assistant

## 2022-10-08 VITALS — BP 129/82 | HR 68 | Temp 97.9°F | Wt 187.0 lb

## 2022-10-08 DIAGNOSIS — I6529 Occlusion and stenosis of unspecified carotid artery: Secondary | ICD-10-CM | POA: Diagnosis present

## 2022-10-08 NOTE — Progress Notes (Signed)
Office Note     CC:  follow up Requesting Provider:  Herma Carson, MD  HPI: Amy Sheppard is a 82 y.o. ([birth year]) female who presents for surveillance of carotid artery stenosis.  She underwent left-sided TCAR by Dr. Myra Gianotti in September 2023 due to symptomatic left ICA stenosis.  She experienced vision loss in her left eye.  She has a small area in her left lower visual field that has vision otherwise she is blind in her left eye.  She denies any other strokelike symptoms including one-sided weakness or slurring speech.  She continues to take her aspirin and statin daily.  She is a former smoker.   Past Medical History:  Diagnosis Date   GERD (gastroesophageal reflux disease)    High cholesterol    Hypertension    Lymphocytic colitis    Migraine    "never anymore; it's been awhile since I've had one" (11/01/2015)   UTI (lower urinary tract infection) X 1    Past Surgical History:  Procedure Laterality Date   ERCP N/A 11/08/2015   Procedure: ENDOSCOPIC RETROGRADE CHOLANGIOPANCREATOGRAPHY (ERCP);  Surgeon: Vida Rigger, MD;  Location: [facility] ENDOSCOPY;  Service: Endoscopy;  Laterality: N/A;   ESOPHAGOGASTRODUODENOSCOPY N/A 11/02/2015   Procedure: ESOPHAGOGASTRODUODENOSCOPY (EGD);  Surgeon: Vida Rigger, MD;  Location: [facility] ENDOSCOPY;  Service: Endoscopy;  Laterality: N/A;   IR GENERIC HISTORICAL  11/03/2015   IR INT EXT BILIARY DRAIN WITH CHOLANGIOGRAM 11/03/2015 Richarda Overlie, MD [facility]   IR GENERIC HISTORICAL  11/17/2015   IR EXCHANGE BILIARY DRAIN 11/17/2015 Berdine Dance, MD [facility]   IR GENERIC HISTORICAL  11/16/2015   IR RADIOLOGIST EVAL & MGMT 11/16/2015 Gilmer Mor, DO [facility] INTERV RAD   SPYGLASS CHOLANGIOSCOPY N/A 11/08/2015   Procedure: MWNUUVOZ CHOLANGIOSCOPY;  Surgeon: Vida Rigger, MD;  Location: [facility] ENDOSCOPY;  Service: Endoscopy;  Laterality: N/A;   TRANSCAROTID ARTERY REVASCULARIZATION  Left 12/06/2021   Procedure: TRANSCAROTID ARTERY REVASCULARIZATION;  Surgeon: Nada Libman, MD;  Location: [facility] OR;  Service: Vascular;  Laterality: Left;   TUBAL LIGATION  1970s   ULTRASOUND GUIDANCE FOR VASCULAR ACCESS Right 12/06/2021   Procedure: ULTRASOUND GUIDANCE FOR VASCULAR ACCESS;  Surgeon: Nada Libman, MD;  Location: [facility] OR;  Service: Vascular;  Laterality: Right;    Social History   Socioeconomic History   Marital status: Widowed    Spouse name: Not on file   Number of children: Not on file   Years of education: Not on file   Highest education level: Not on file  Occupational History   Not on file  Tobacco Use   Smoking status: Former    Current packs/day: 1.00    Average packs/day: 1 pack/day for 30.0 years (30.0 ttl pk-yrs)    Types: Cigarettes   Smokeless tobacco: Never  Vaping Use   Vaping status: Never Used  Substance and Sexual Activity   Alcohol use: No   Drug use: No   Sexual activity: Never  Other Topics Concern   Not on file  Social History Narrative   Not on file   Social Determinants of Health   Financial Resource Strain: Low Risk  (06/13/2021)   [facility] to [facility]26[facility] visits prior to 05/26/2022.   Overall Financial Resource Strain (CARDIA)    Difficulty of Paying Living Expenses: Not hard at all  Food Insecurity: Low Risk  (08/07/2022)   [facility]  Food vital sign    Within the past 12 months, you worried that your food would run out before you got money to buy more: Never true    Within the past 12 months, the food you bought just didn't last and you didn't have money to get more. : Never true  Transportation Needs: No Transportation Needs (05/01/2022)   [facility] from [facility] visits prior to 05/26/2022., [facility] visits prior to 05/26/2022.   Transportation    In the past 12 months, has lack of reliable transportation kept you from  medical appointments, meetings, work or from getting things needed for daily living?: No  Physical Activity: Inactive (06/13/2021)   [facility] visits prior to 05/26/2022., [facility] visits prior to 05/26/2022.   Exercise Vital Sign    Days of Exercise per Week: 0 days    Minutes of Exercise per Session: 0 min  Stress: Stress Concern Present (06/13/2021)   [facility] prior to 05/26/2022[facility] visits prior to 05/26/2022.   [employer] of Occupational Health - Occupational Stress Questionnaire    Feeling of Stress : To some extent  Social Connections: Unknown (08/07/2021)   Received from [facility]   Social Network    Social Network: Not on file  Recent Concern: Social Connections - Socially Isolated (06/13/2021)   [facility] prior to 05/26/2022., [facility] visits prior to 05/26/2022.   Social Advertising account executive [NHANES]    Frequency of Communication with Friends and Family: More than three times a week    Frequency of Social Gatherings with Friends and Family: More than three times a week    Attends Religious Services: Never    Database administrator or Organizations: No    Attends Banker Meetings: Never    Marital Status: Widowed  Intimate Partner Violence: Unknown (06/29/2021)   Received from [facility]   HITS    Physically Hurt: Not on file    Insult or Talk Down To: Not on file    Threaten Physical Harm: Not on file    Scream or Curse: Not on file   No family history on file.  Current Outpatient Medications  Medication Sig Dispense Refill   atenolol (TENORMIN) 25 MG tablet Take 25 mg by mouth daily.     CALCIUM PO Take 1 tablet by mouth daily.     FEROSUL 325 (65 Fe) MG  tablet Take 1 tablet by mouth in the morning and at bedtime.     FLUoxetine (PROZAC) 10 MG capsule Take 1 capsule (10 mg total) by mouth daily. 30 capsule 0   letrozole (FEMARA) 2.5 MG tablet Take 2.5 mg by mouth daily.     lisinopril (ZESTRIL) 40 MG tablet Take 1 tablet by mouth daily.     omeprazole (PRILOSEC) 40 MG capsule Take 40 mg by mouth daily.     VITAMIN D PO Take 1 tablet by mouth daily.     Vitamins/Minerals TABS Take 1 tablet by mouth daily.     atorvastatin (LIPITOR) 40 MG tablet Take 1 tablet (40 mg total) by mouth daily. 30 tablet 0   gabapentin (NEURONTIN) 300 MG capsule Take 300 mg by mouth 2 (two) times daily.  No current facility-administered medications for this visit.    Allergies  Allergen Reactions   Tetanus Toxoids Swelling    WELTS     REVIEW OF SYSTEMS:   [X]  denotes positive finding, [ ]  denotes negative finding Cardiac  Comments:  Chest pain or chest pressure:    Shortness of breath upon exertion:    Short of breath when lying flat:    Irregular heart rhythm:        Vascular    Pain in calf, thigh, or hip brought on by ambulation:    Pain in feet at night that wakes you up from your sleep:     Blood clot in your veins:    Leg swelling:         Pulmonary    Oxygen at home:    Productive cough:     Wheezing:         Neurologic    Sudden weakness in arms or legs:     Sudden numbness in arms or legs:     Sudden onset of difficulty speaking or slurred speech:    Temporary loss of vision in one eye:     Problems with dizziness:         Gastrointestinal    Blood in stool:     Vomited blood:         Genitourinary    Burning when urinating:     Blood in urine:        Psychiatric    Major depression:         Hematologic    Bleeding problems:    Problems with blood clotting too easily:        Skin    Rashes or ulcers:        Constitutional    Fever or chills:      PHYSICAL EXAMINATION:  Vitals:   10/08/22 1300 10/08/22 1302   BP: (!) 140/88 129/82  Pulse: 68   Temp: 97.9 F (36.6 C)   TempSrc: Temporal   SpO2: 95%   Weight: 187 lb (84.8 kg)     General:  WDWN in NAD; vital signs documented above Gait: Not observed HENT: WNL, normocephalic Pulmonary: normal non-labored breathing , without Rales, rhonchi,  wheezing Cardiac: regular HR Abdomen: soft, NT, no masses Skin: without rashes Vascular Exam/Pulses: palpable radial pulses Extremities: without ischemic changes, without Gangrene , without cellulitis; without open wounds;  Musculoskeletal: no muscle wasting or atrophy  Neurologic: A&O X 3; CN grossly intact Psychiatric:  The pt has Normal affect.   Non-Invasive Vascular Imaging:   Right ICA 1 to 39% stenosis Left ICA stent widely patent    ASSESSMENT/PLAN:: 82 y.o. female here for follow up for surveillance of carotid artery stenosis with history of left TCAR  -Subjectively, no further strokelike symptoms after surgery.  Still with blindness in her left eye except for small area in her left lower visual field.  Duplex demonstrates a widely patent left ICA stent.  Right ICA stenosis 1 to 39% by duplex.  Continue aspirin and statin daily.  Recheck carotid duplex in 1 year.   Emilie Rutter, PA-C [facility] [phone]  Clinic MD:   Myra Gianotti

## 2022-10-17 ENCOUNTER — Other Ambulatory Visit: Payer: Self-pay

## 2022-10-17 DIAGNOSIS — I6529 Occlusion and stenosis of unspecified carotid artery: Secondary | ICD-10-CM

## 2023-10-29 ENCOUNTER — Other Ambulatory Visit: Payer: Self-pay

## 2023-10-29 DIAGNOSIS — I6529 Occlusion and stenosis of unspecified carotid artery: Secondary | ICD-10-CM

## 2023-12-02 ENCOUNTER — Ambulatory Visit: Attending: Surgery | Admitting: Physician Assistant

## 2023-12-02 ENCOUNTER — Ambulatory Visit (HOSPITAL_COMMUNITY)
Admission: RE | Admit: 2023-12-02 | Discharge: 2023-12-02 | Disposition: A | Discharge status: Home / Self Care | Source: Ambulatory Visit | Attending: Surgery | Admitting: Surgery

## 2023-12-02 VITALS — BP 155/80 | HR 69 | Temp 97.7°F | Wt 186.7 lb

## 2023-12-02 DIAGNOSIS — I6529 Occlusion and stenosis of unspecified carotid artery: Secondary | ICD-10-CM

## 2023-12-02 NOTE — Progress Notes (Signed)
 Office Note   History of Present Illness   Amy Sheppard is a 83 y.o. (August 29, 1940) female who presents for surveillance of carotid artery stenosis.  She has a history of left TCAR on 12/06/2021 by Dr. Serene.  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  She returns today for follow-up.  She has no complaints at today's visit. She denies any recent strokelike symptoms such as slurred speech, facial droop, sudden visual changes, or sudden weakness/numbness.  She is recovering well from a recent right total knee replacement.  Current Outpatient Medications  Medication Sig Dispense Refill   atenolol  (TENORMIN ) 25 MG tablet Take 25 mg by mouth daily.     atorvastatin  (LIPITOR) 40 MG tablet Take 1 tablet (40 mg total) by mouth daily. 30 tablet 0   CALCIUM  PO Take 1 tablet by mouth daily.     FEROSUL 325 (65 Fe) MG tablet Take 1 tablet by mouth in the morning and at bedtime.     FLUoxetine  (PROZAC ) 10 MG capsule Take 1 capsule (10 mg total) by mouth daily. 30 capsule 0   gabapentin  (NEURONTIN ) 300 MG capsule Take 300 mg by mouth 2 (two) times daily.     letrozole  (FEMARA ) 2.5 MG tablet Take 2.5 mg by mouth daily.     lisinopril  (ZESTRIL ) 40 MG tablet Take 1 tablet by mouth daily.     omeprazole (PRILOSEC) 40 MG capsule Take 40 mg by mouth daily.     VITAMIN D PO Take 1 tablet by mouth daily.     Vitamins/Minerals TABS Take 1 tablet by mouth daily.     No current facility-administered medications for this visit.    REVIEW OF SYSTEMS (negative unless checked):   Cardiac:  []  Chest pain or chest pressure? []  Shortness of breath upon activity? []  Shortness of breath when lying flat? []  Irregular heart rhythm?  Vascular:  []  Pain in calf, thigh, or hip brought on by walking? []  Pain in feet at night that wakes you up from your sleep? []  Blood clot in your veins? []  Leg swelling?  Pulmonary:  []  Oxygen at home? []  Productive cough? []   Wheezing?  Neurologic:  []  Sudden weakness in arms or legs? []  Sudden numbness in arms or legs? []  Sudden onset of difficult speaking or slurred speech? []  Temporary loss of vision in one eye? []  Problems with dizziness?  Gastrointestinal:  []  Blood in stool? []  Vomited blood?  Genitourinary:  []  Burning when urinating? []  Blood in urine?  Psychiatric:  []  Major depression  Hematologic:  []  Bleeding problems? []  Problems with blood clotting?  Dermatologic:  []  Rashes or ulcers?  Constitutional:  []  Fever or chills?  Ear/Nose/Throat:  []  Change in hearing? []  Nose bleeds? []  Sore throat?  Musculoskeletal:  []  Back pain? []  Joint pain? []  Muscle pain?   Physical Examination   Vitals:   12/02/23 1325 12/02/23 1326  BP: (!) 150/83 (!) 155/80  Pulse: 69   Temp: 97.7 F (36.5 C)   TempSrc: Temporal   Weight: 186 lb 11.2 oz (84.7 kg)    Body mass index is 34.15 kg/m.  General:  WDWN in NAD; vital signs documented above Gait: Not observed HENT: WNL, normocephalic Pulmonary: normal non-labored breathing , without rales, rhonchi,  wheezing Cardiac: Regular Abdomen: soft, NT, no masses Skin: without rashes Vascular Exam/Pulses: palpable radial pulses bilaterally Extremities: without ischemic changes, without gangrene , without cellulitis; without open wounds;  Musculoskeletal: no muscle wasting or  atrophy  Neurologic: A&O X 3;  No focal weakness or paresthesias are detected Psychiatric:  The pt has Normal affect.  Non-Invasive Vascular Imaging   Bilateral Carotid Duplex (12/02/2023):  R ICA stenosis:  1-39% R VA:  patent and antegrade L ICA stenosis:  Patent left carotid stent L VA:  patent and antegrade   Medical Decision Making   Amy Sheppard is a 83 y.o. female who presents for surveillance of carotid artery stenosis  Based on the patient's vascular studies, her carotid artery stenosis is unchanged bilaterally.  She has 1 to 39% right ICA  stenosis.  She has a patent left carotid stent without stenosis She denies any strokelike symptoms such as slurred speech, facial droop, sudden visual changes, or sudden weakness/numbness.  She has no carotid bruits on exam.  She has palpable and equal radial pulses She can follow-up with office in 1 year with repeat carotid duplex   Ahmed Holster PA-C [facility] [place] Office: [phone]  Clinic MD: Serene
